# Patient Record
Sex: Male | Born: 1975
Health system: Southern US, Community
[De-identification: ages and names within clinical notes are randomized; demographics above are authoritative.]

## PROBLEM LIST (undated history)

## (undated) DIAGNOSIS — E039 Hypothyroidism, unspecified: Secondary | ICD-10-CM

## (undated) DIAGNOSIS — E7800 Pure hypercholesterolemia, unspecified: Secondary | ICD-10-CM

## (undated) DIAGNOSIS — I10 Essential (primary) hypertension: Secondary | ICD-10-CM

## (undated) DIAGNOSIS — E079 Disorder of thyroid, unspecified: Secondary | ICD-10-CM

## (undated) HISTORY — DX: Essential (primary) hypertension: I10

---

## 2007-08-05 ENCOUNTER — Encounter: Admission: RE | Admit: 2007-08-05 | Discharge: 2007-08-05 | Payer: Self-pay | Admitting: Family Medicine

## 2010-04-15 ENCOUNTER — Encounter: Payer: Self-pay | Admitting: Internal Medicine

## 2010-08-20 ENCOUNTER — Emergency Department (HOSPITAL_COMMUNITY)
Admission: EM | Admit: 2010-08-20 | Discharge: 2010-08-20 | Disposition: A | Discharge status: Home / Self Care | Payer: Medicaid Other | Attending: Emergency Medicine | Admitting: Emergency Medicine

## 2010-08-20 DIAGNOSIS — E7800 Pure hypercholesterolemia, unspecified: Secondary | ICD-10-CM | POA: Insufficient documentation

## 2010-08-20 DIAGNOSIS — R599 Enlarged lymph nodes, unspecified: Secondary | ICD-10-CM | POA: Insufficient documentation

## 2010-08-20 DIAGNOSIS — R197 Diarrhea, unspecified: Secondary | ICD-10-CM | POA: Insufficient documentation

## 2010-08-20 DIAGNOSIS — R1011 Right upper quadrant pain: Secondary | ICD-10-CM | POA: Insufficient documentation

## 2010-08-21 ENCOUNTER — Other Ambulatory Visit: Payer: Self-pay | Admitting: Family Medicine

## 2010-08-22 ENCOUNTER — Other Ambulatory Visit: Payer: Self-pay | Admitting: Family Medicine

## 2010-08-22 ENCOUNTER — Ambulatory Visit
Admission: RE | Admit: 2010-08-22 | Discharge: 2010-08-22 | Disposition: A | Discharge status: Home / Self Care | Payer: Medicaid Other | Source: Ambulatory Visit | Attending: Family Medicine | Admitting: Family Medicine

## 2010-08-22 DIAGNOSIS — R109 Unspecified abdominal pain: Secondary | ICD-10-CM

## 2011-03-26 ENCOUNTER — Emergency Department (HOSPITAL_COMMUNITY): Payer: Medicaid Other

## 2011-03-26 ENCOUNTER — Encounter: Payer: Self-pay | Admitting: Emergency Medicine

## 2011-03-26 ENCOUNTER — Emergency Department (HOSPITAL_COMMUNITY)
Admission: EM | Admit: 2011-03-26 | Discharge: 2011-03-27 | Disposition: A | Discharge status: Home / Self Care | Payer: Medicaid Other | Attending: Emergency Medicine | Admitting: Emergency Medicine

## 2011-03-26 DIAGNOSIS — B9789 Other viral agents as the cause of diseases classified elsewhere: Secondary | ICD-10-CM | POA: Insufficient documentation

## 2011-03-26 DIAGNOSIS — R059 Cough, unspecified: Secondary | ICD-10-CM | POA: Insufficient documentation

## 2011-03-26 DIAGNOSIS — R05 Cough: Secondary | ICD-10-CM | POA: Insufficient documentation

## 2011-03-26 DIAGNOSIS — IMO0001 Reserved for inherently not codable concepts without codable children: Secondary | ICD-10-CM | POA: Insufficient documentation

## 2011-03-26 DIAGNOSIS — R61 Generalized hyperhidrosis: Secondary | ICD-10-CM | POA: Insufficient documentation

## 2011-03-26 DIAGNOSIS — R079 Chest pain, unspecified: Secondary | ICD-10-CM | POA: Insufficient documentation

## 2011-03-26 DIAGNOSIS — R070 Pain in throat: Secondary | ICD-10-CM | POA: Insufficient documentation

## 2011-03-26 DIAGNOSIS — B349 Viral infection, unspecified: Secondary | ICD-10-CM

## 2011-03-26 DIAGNOSIS — Z79899 Other long term (current) drug therapy: Secondary | ICD-10-CM | POA: Insufficient documentation

## 2011-03-26 DIAGNOSIS — E7800 Pure hypercholesterolemia, unspecified: Secondary | ICD-10-CM | POA: Insufficient documentation

## 2011-03-26 DIAGNOSIS — R509 Fever, unspecified: Secondary | ICD-10-CM | POA: Insufficient documentation

## 2011-03-26 HISTORY — DX: Pure hypercholesterolemia, unspecified: E78.00

## 2011-03-26 NOTE — ED Notes (Signed)
PT. REPORTS PERSISTENT DRY COUGH , CHEST CONGESTION AND NAUSEA FOR 2 MONTHS , DENIES SOB , NO FEVER OR CHILLS.

## 2011-03-27 MED ORDER — IBUPROFEN 800 MG PO TABS
800.0000 mg | ORAL_TABLET | Freq: Once | ORAL | Status: AC
  Administered 2011-03-27: 800 mg via ORAL
  Filled 2011-03-27: qty 1

## 2011-03-27 MED ORDER — ACETAMINOPHEN-CODEINE 120-12 MG/5ML PO SUSP: 5.0000 mL | Freq: Four times a day (QID) | ORAL | Status: AC | PRN

## 2011-03-27 MED ORDER — ONDANSETRON 4 MG PO TBDP
8.0000 mg | ORAL_TABLET | Freq: Once | ORAL | Status: AC
  Administered 2011-03-27: 8 mg via ORAL
  Filled 2011-03-27: qty 2

## 2011-03-27 MED ORDER — IBUPROFEN 800 MG PO TABS: 800.0000 mg | ORAL_TABLET | Freq: Three times a day (TID) | ORAL | Status: AC

## 2011-03-27 MED ORDER — PROMETHAZINE HCL 25 MG PO TABS: 25.0000 mg | ORAL_TABLET | Freq: Four times a day (QID) | ORAL | Status: AC | PRN

## 2011-03-27 MED ORDER — ACETAMINOPHEN 325 MG PO TABS
650.0000 mg | ORAL_TABLET | Freq: Once | ORAL | Status: AC
  Administered 2011-03-27: 650 mg via ORAL
  Filled 2011-03-27: qty 2

## 2011-03-27 NOTE — ED Provider Notes (Signed)
History     CSN: [id number]  Arrival date & time 03/26/11  2202   First MD Initiated Contact with Patient 03/27/11 0117      Chief Complaint  Patient presents with  . Cough    (Consider location/radiation/quality/duration/timing/severity/associated sxs/prior treatment) Patient is a 36 y.o. male presenting with cough. The history is provided by the patient.  Cough This is a new problem. Episode onset: Are off last few weeks but worse over the last 2 days. The problem occurs every few minutes. The problem has been gradually worsening. The cough is non-productive. The maximum temperature recorded prior to his arrival was 102 to 102.9 F. The fever has been present for less than 1 day. Associated symptoms include chest pain, chills, sweats, sore throat and myalgias. Pertinent negatives include no headaches and no shortness of breath. He has tried nothing for the symptoms. The treatment provided no relief. Risk factors: No known sick contacts. Did not receive the flu shot this season. He is not a smoker. His past medical history does not include asthma.   Moderate in severity. Much worse tonight with fever bodyaches all over. No sick contacts at home or at work. He does take Questran medications but has no other medical problems. No known aggravating or alleviating factors. Past Medical History  Diagnosis Date  . Hypercholesterolemia     History reviewed. No pertinent past surgical history.  No family history on file.  History  Substance Use Topics  . Smoking status: Former Games developer  . Smokeless tobacco: Not on file  . Alcohol Use: Yes      Review of Systems  Constitutional: Positive for chills. Negative for fever.  HENT: Positive for sore throat. Negative for neck pain and neck stiffness.   Eyes: Negative for pain.  Respiratory: Positive for cough. Negative for shortness of breath.   Cardiovascular: Positive for chest pain.  Gastrointestinal: Negative for abdominal pain.    Genitourinary: Negative for dysuria.  Musculoskeletal: Positive for myalgias. Negative for back pain.  Skin: Negative for rash.  Neurological: Negative for headaches.  All other systems reviewed and are negative.    Allergies  Review of patient's allergies indicates no known allergies.  Home Medications   Current Outpatient Rx  Name Route Sig Dispense Refill  . PRAVASTATIN SODIUM 20 MG PO TABS Oral Take 20 mg by mouth daily.        BP 127/73  Pulse 96  Temp(Src) 99.9 F (37.7 C) (Oral)  Resp 22  SpO2 99%  Physical Exam  Constitutional: He is oriented to person, place, and time. He appears well-developed and well-nourished.  HENT:  Head: Normocephalic and atraumatic.  Nose: Nose normal.  Mouth/Throat: Oropharynx is clear and moist. No oropharyngeal exudate.  Eyes: Conjunctivae and EOM are normal. Pupils are equal, round, and reactive to light.  Neck: Trachea normal. Neck supple. No thyromegaly present.  Cardiovascular: Normal rate, regular rhythm, S1 normal, S2 normal and normal pulses.     No systolic murmur is present   No diastolic murmur is present  Pulses:      Radial pulses are 2+ on the right side, and 2+ on the left side.  Pulmonary/Chest: Effort normal and breath sounds normal. He has no wheezes. He has no rhonchi. He has no rales. He exhibits no tenderness.  Abdominal: Soft. Normal appearance and bowel sounds are normal. There is no tenderness. There is no CVA tenderness and negative Murphy's sign.  Musculoskeletal:       BLE:s Calves nontender,  no cords or erythema, negative Homans sign  Neurological: He is alert and oriented to person, place, and time. He has normal strength. No cranial nerve deficit or sensory deficit. GCS eye subscore is 4. GCS verbal subscore is 5. GCS motor subscore is 6.  Skin: Skin is warm and dry. No rash noted.  Psychiatric: His speech is normal.       Cooperative and appropriate    ED Course  Procedures (including critical care  time)  Labs Reviewed - No data to display Dg Chest 2 View  03/26/2011  *RADIOLOGY REPORT*  Clinical Data: Persistent dry cough.  Chest and abdominal pain for 2 months.  CHEST - 2 VIEW  Comparison: 08/05/2007 and 08/22/2010.  Findings: The heart size and mediastinal contours are normal. The lungs are clear. There is no pleural effusion or pneumothorax. No acute osseous findings are identified.  IMPRESSION: Stable examination.  No active cardiopulmonary process.  Original Report Authenticated By: Gerrianne Scale, M.D.    Tylenol and Motrin for fever. By mouth fluids and Zofran for some associated nausea.   MDM   Flulike symptoms. Chest x-ray obtained and reviewed as above no pneumonia. Symptoms improving with Tylenol, Motrin and Zofran. Work note provided. Viral precautions verbalized is understood. Patient has outpatient followup as needed otherwise states understanding all discharge and followup instructions. He is stable for discharge home without indication for further workup at this time       Sunnie Nielsen, MD 03/27/11 [phone]

## 2011-07-16 ENCOUNTER — Emergency Department (HOSPITAL_COMMUNITY)
Admission: EM | Admit: 2011-07-16 | Discharge: 2011-07-17 | Disposition: A | Discharge status: Home / Self Care | Payer: Medicaid Other | Attending: Emergency Medicine | Admitting: Emergency Medicine

## 2011-07-16 ENCOUNTER — Encounter (HOSPITAL_COMMUNITY): Payer: Self-pay | Admitting: Emergency Medicine

## 2011-07-16 ENCOUNTER — Emergency Department (HOSPITAL_COMMUNITY): Payer: Medicaid Other

## 2011-07-16 DIAGNOSIS — X58XXXA Exposure to other specified factors, initial encounter: Secondary | ICD-10-CM | POA: Insufficient documentation

## 2011-07-16 DIAGNOSIS — R1032 Left lower quadrant pain: Secondary | ICD-10-CM | POA: Insufficient documentation

## 2011-07-16 DIAGNOSIS — S39011A Strain of muscle, fascia and tendon of abdomen, initial encounter: Secondary | ICD-10-CM

## 2011-07-16 DIAGNOSIS — E7800 Pure hypercholesterolemia, unspecified: Secondary | ICD-10-CM | POA: Insufficient documentation

## 2011-07-16 DIAGNOSIS — IMO0002 Reserved for concepts with insufficient information to code with codable children: Secondary | ICD-10-CM | POA: Insufficient documentation

## 2011-07-16 DIAGNOSIS — R10819 Abdominal tenderness, unspecified site: Secondary | ICD-10-CM | POA: Insufficient documentation

## 2011-07-16 DIAGNOSIS — Z87891 Personal history of nicotine dependence: Secondary | ICD-10-CM | POA: Insufficient documentation

## 2011-07-16 LAB — URINALYSIS, ROUTINE W REFLEX MICROSCOPIC
Bilirubin Urine: NEGATIVE
Ketones, ur: NEGATIVE mg/dL
Nitrite: NEGATIVE
Urobilinogen, UA: 0.2 mg/dL (ref 0.0–1.0)

## 2011-07-16 LAB — POCT I-STAT, CHEM 8
Calcium, Ion: 1.3 mmol/L (ref 1.12–1.32)
Creatinine, Ser: 0.8 mg/dL (ref 0.50–1.35)
Hemoglobin: 16 g/dL (ref 13.0–17.0)
Sodium: 144 mEq/L (ref 135–145)
TCO2: 26 mmol/L (ref 0–100)

## 2011-07-16 LAB — CBC
Hemoglobin: 14.9 g/dL (ref 13.0–17.0)
MCH: 28.1 pg (ref 26.0–34.0)
MCHC: 34.8 g/dL (ref 30.0–36.0)
MCV: 80.6 fL (ref 78.0–100.0)
Platelets: 205 10*3/uL (ref 150–400)

## 2011-07-16 MED ORDER — IOHEXOL 300 MG/ML  SOLN
100.0000 mL | Freq: Once | INTRAMUSCULAR | Status: AC | PRN
  Administered 2011-07-16: 100 mL via INTRAVENOUS

## 2011-07-16 MED ORDER — SODIUM CHLORIDE 0.9 % IV SOLN
Freq: Once | INTRAVENOUS | Status: AC
  Administered 2011-07-16: 23:00:00 via INTRAVENOUS

## 2011-07-16 MED ORDER — HYDROMORPHONE HCL PF 1 MG/ML IJ SOLN
1.0000 mg | Freq: Once | INTRAMUSCULAR | Status: AC
  Administered 2011-07-16: 1 mg via INTRAVENOUS
  Filled 2011-07-16: qty 1

## 2011-07-16 MED ORDER — ONDANSETRON HCL 4 MG/2ML IJ SOLN
4.0000 mg | Freq: Once | INTRAMUSCULAR | Status: AC
  Administered 2011-07-16: 4 mg via INTRAVENOUS
  Filled 2011-07-16: qty 2

## 2011-07-16 MED ORDER — OXYCODONE-ACETAMINOPHEN 5-325 MG PO TABS
2.0000 | ORAL_TABLET | Freq: Once | ORAL | Status: AC
  Administered 2011-07-17: 2 via ORAL
  Filled 2011-07-16: qty 2

## 2011-07-16 MED ORDER — DIAZEPAM 5 MG/ML IJ SOLN
5.0000 mg | Freq: Once | INTRAMUSCULAR | Status: AC
  Administered 2011-07-17: 5 mg via INTRAVENOUS
  Filled 2011-07-16: qty 2

## 2011-07-16 NOTE — ED Provider Notes (Signed)
History     CSN: [id number]  Arrival date & time 07/16/11  1908   First MD Initiated Contact with Patient 07/16/11 2012      Chief Complaint  Patient presents with  . Abdominal Pain    (Consider location/radiation/quality/duration/timing/severity/associated sxs/prior treatment) HPI Comments: She had sudden onset left lower quadrant pain.  Today, worse when he moves he had normal bowel movements normal urination.  Denies testicular pain/ trauma .  Denies blood in his stool or his urine denies history of kidney stones  Patient is a 36 y.o. male presenting with abdominal pain. The history is provided by the patient.  Abdominal Pain The primary symptoms of the illness include abdominal pain. The primary symptoms of the illness do not include fatigue, shortness of breath, nausea, vomiting, diarrhea or dysuria. The current episode started 13 to 24 hours ago. The onset of the illness was sudden. The problem has not changed since onset. The patient has not had a change in bowel habit. Symptoms associated with the illness do not include constipation, urgency, hematuria, frequency or back pain.    Past Medical History  Diagnosis Date  . Hypercholesterolemia     History reviewed. No pertinent past surgical history.  No family history on file.  History  Substance Use Topics  . Smoking status: Former Games developer  . Smokeless tobacco: Not on file  . Alcohol Use: Yes      Review of Systems  Constitutional: Negative for fatigue.  Respiratory: Negative for shortness of breath.   Cardiovascular: Negative for leg swelling.  Gastrointestinal: Positive for abdominal pain. Negative for nausea, vomiting, diarrhea, constipation, blood in stool, anal bleeding and rectal pain.  Genitourinary: Negative for dysuria, urgency, frequency, hematuria, flank pain, penile pain and testicular pain.  Musculoskeletal: Negative for back pain.  Neurological: Negative for weakness.    Allergies  Review of  patient's allergies indicates no known allergies.  Home Medications   Current Outpatient Rx  Name Route Sig Dispense Refill  . PRAVASTATIN SODIUM 20 MG PO TABS Oral Take 20 mg by mouth daily.      Marland Kitchen DIAZEPAM 5 MG PO TABS Oral Take 1 tablet (5 mg total) by mouth 2 (two) times daily. 10 tablet 0  . HYDROCODONE-ACETAMINOPHEN 5-500 MG PO TABS Oral Take 1 tablet by mouth every 6 (six) hours as needed for pain. 30 tablet 0    BP 129/83  Pulse 79  Temp(Src) 98.7 F (37.1 C) (Oral)  Resp 20  SpO2 100%  Physical Exam  Constitutional: He is oriented to person, place, and time. He appears well-developed and well-nourished.  Neck: Normal range of motion.  Cardiovascular: Normal rate.   Pulmonary/Chest: Effort normal.  Abdominal: Soft. Bowel sounds are normal. He exhibits no distension. There is tenderness. There is no rebound. No hernia. Hernia confirmed negative in the right inguinal area and confirmed negative in the left inguinal area.    Genitourinary: Rectum normal and penis normal. Right testis shows no swelling and no tenderness. Cremasteric reflex is not absent on the right side. Left testis shows no swelling and no tenderness. Cremasteric reflex is not absent on the left side. Circumcised. No penile tenderness.  Neurological: He is alert and oriented to person, place, and time.  Skin: Skin is warm. No rash noted. No erythema.    ED Course  Procedures (including critical care time)   Labs Reviewed  URINALYSIS, ROUTINE W REFLEX MICROSCOPIC  CBC  POCT I-STAT, CHEM 8   Ct Abdomen Pelvis W Contrast  07/16/2011  *RADIOLOGY REPORT*  Clinical Data: Left lower quadrant pain and nausea since this morning.  CT ABDOMEN AND PELVIS WITH CONTRAST  Technique:  Multidetector CT imaging of the abdomen and pelvis was performed following the standard protocol during bolus administration of intravenous contrast.  Contrast:  100 ml Omnipaque-300  Comparison: None.  Findings: Slight fibrosis in the  left lung base.  The liver, spleen, gallbladder, pancreas, adrenal glands, kidneys, abdominal aorta, retroperitoneal lymph nodes, stomach, and small bowel are unremarkable.  Stool filled colon without distension or wall thickening.  No free air or free fluid in the abdomen.  Pelvis:  Prostate gland is not enlarged.  No bladder wall thickening.  No free or loculated pelvic fluid collections.  No evidence of diverticulitis.  No significant pelvic lymphadenopathy. The appendix is normal.  Normal alignment of the lumbar vertebrae with mild lower thoracic degenerative change.  IMPRESSION: No acute process demonstrated in the abdomen or pelvis.  Original Report Authenticated By: Marlon Pel, M.D.     1. Abdominal muscle strain       MDM  Left lower quadrant/groin pain, without erythema, enlarged lymph nodes.  No testicular involvement.  No sign of inguinal hernia, abscess.  Will obtain CBC i-STAT, and abdominal CT, as well as a urine If you have labs and CT scan reveals normal results.  We'll treat as a muscular injury and have patient followup with primary care provider The patient was given IV Valium and by mouth Percocet.  He is up walking around the room stating he feels much better.  We'll discharge home with by mouth Valium, Vicodin as needed        Arman Filter, NP 07/17/11 [phone]

## 2011-07-16 NOTE — ED Notes (Signed)
Patient with abdominal pain that started this morning.  Patient denies any n/v/d.  BM x2, normal.  Sent from MD office,.

## 2011-07-17 MED ORDER — DIAZEPAM 5 MG PO TABS: 5.0000 mg | ORAL_TABLET | Freq: Two times a day (BID) | ORAL | Status: AC

## 2011-07-17 MED ORDER — HYDROCODONE-ACETAMINOPHEN 5-500 MG PO TABS: 1.0000 | ORAL_TABLET | Freq: Four times a day (QID) | ORAL | Status: AC | PRN

## 2011-07-17 NOTE — Discharge Instructions (Signed)
Please take the medication as directed for your abdominal wall strain.  He can use over-the-counter ibuprofen or Advil as well.  Do not take additional Tylenol as the Vicodin contains Tylenol.  Please try to rest perform your daily activities, but no additional sports heavy lifting yard work

## 2011-07-20 NOTE — ED Provider Notes (Signed)
Medical screening examination/treatment/procedure(s) were performed by non-physician practitioner and as supervising physician I was immediately available for consultation/collaboration.  Geoffery Lyons, MD 07/20/11 [phone]

## 2014-05-31 ENCOUNTER — Emergency Department (HOSPITAL_COMMUNITY)
Admission: EM | Admit: 2014-05-31 | Discharge: 2014-05-31 | Disposition: A | Discharge status: Home / Self Care | Payer: Medicaid Other | Attending: Emergency Medicine | Admitting: Emergency Medicine

## 2014-05-31 ENCOUNTER — Encounter (HOSPITAL_COMMUNITY): Payer: Self-pay | Admitting: Respiratory Therapy

## 2014-05-31 ENCOUNTER — Emergency Department (HOSPITAL_COMMUNITY): Payer: Medicaid Other

## 2014-05-31 DIAGNOSIS — Z8639 Personal history of other endocrine, nutritional and metabolic disease: Secondary | ICD-10-CM | POA: Insufficient documentation

## 2014-05-31 DIAGNOSIS — Z87891 Personal history of nicotine dependence: Secondary | ICD-10-CM | POA: Diagnosis not present

## 2014-05-31 DIAGNOSIS — R1110 Vomiting, unspecified: Secondary | ICD-10-CM | POA: Diagnosis present

## 2014-05-31 DIAGNOSIS — K529 Noninfective gastroenteritis and colitis, unspecified: Secondary | ICD-10-CM | POA: Insufficient documentation

## 2014-05-31 DIAGNOSIS — B349 Viral infection, unspecified: Secondary | ICD-10-CM | POA: Insufficient documentation

## 2014-05-31 LAB — CBC WITH DIFFERENTIAL/PLATELET
BASOS ABS: 0 10*3/uL (ref 0.0–0.1)
BASOS PCT: 0 % (ref 0–1)
EOS ABS: 0 10*3/uL (ref 0.0–0.7)
Eosinophils Relative: 0 % (ref 0–5)
HEMATOCRIT: 43.4 % (ref 39.0–52.0)
HEMOGLOBIN: 15 g/dL (ref 13.0–17.0)
LYMPHS PCT: 17 % (ref 12–46)
Lymphs Abs: 0.9 10*3/uL (ref 0.7–4.0)
MCH: 27 pg (ref 26.0–34.0)
MCHC: 34.6 g/dL (ref 30.0–36.0)
MCV: 78.2 fL (ref 78.0–100.0)
MONOS PCT: 16 % — AB (ref 3–12)
Monocytes Absolute: 0.8 10*3/uL (ref 0.1–1.0)
NEUTROS PCT: 67 % (ref 43–77)
Neutro Abs: 3.5 10*3/uL (ref 1.7–7.7)
PLATELETS: 207 10*3/uL (ref 150–400)
RBC: 5.55 MIL/uL (ref 4.22–5.81)
RDW: 12.9 % (ref 11.5–15.5)
WBC: 5.2 10*3/uL (ref 4.0–10.5)

## 2014-05-31 LAB — URINALYSIS, ROUTINE W REFLEX MICROSCOPIC
Glucose, UA: NEGATIVE mg/dL
HGB URINE DIPSTICK: NEGATIVE
Ketones, ur: 40 mg/dL — AB
LEUKOCYTES UA: NEGATIVE
Nitrite: NEGATIVE
PH: 6 (ref 5.0–8.0)
PROTEIN: 30 mg/dL — AB
SPECIFIC GRAVITY, URINE: 1.028 (ref 1.005–1.030)
Urobilinogen, UA: 0.2 mg/dL (ref 0.0–1.0)

## 2014-05-31 LAB — COMPREHENSIVE METABOLIC PANEL
ALBUMIN: 3.7 g/dL (ref 3.5–5.2)
ALK PHOS: 97 U/L (ref 39–117)
ALT: 51 U/L (ref 0–53)
ANION GAP: 8 (ref 5–15)
AST: 75 U/L — ABNORMAL HIGH (ref 0–37)
BUN: 8 mg/dL (ref 6–23)
CO2: 21 mmol/L (ref 19–32)
Calcium: 8.9 mg/dL (ref 8.4–10.5)
Chloride: 104 mmol/L (ref 96–112)
Creatinine, Ser: 1.01 mg/dL (ref 0.50–1.35)
GFR calc Af Amer: >90 mL/min (ref >90)
GLUCOSE: 106 mg/dL — AB (ref 70–99)
POTASSIUM: 3.5 mmol/L (ref 3.5–5.1)
Sodium: 133 mmol/L — ABNORMAL LOW (ref 135–145)
Total Bilirubin: 0.6 mg/dL (ref 0.3–1.2)
Total Protein: 7.2 g/dL (ref 6.0–8.3)

## 2014-05-31 LAB — URINE MICROSCOPIC-ADD ON

## 2014-05-31 MED ORDER — SODIUM CHLORIDE 0.9 % IV BOLUS (SEPSIS)
1000.0000 mL | Freq: Once | INTRAVENOUS | Status: AC
  Administered 2014-05-31: 1000 mL via INTRAVENOUS

## 2014-05-31 MED ORDER — ONDANSETRON HCL 4 MG/2ML IJ SOLN
4.0000 mg | Freq: Once | INTRAMUSCULAR | Status: AC
  Administered 2014-05-31: 4 mg via INTRAVENOUS
  Filled 2014-05-31: qty 2

## 2014-05-31 MED ORDER — ONDANSETRON 4 MG PO TBDP: ORAL_TABLET | ORAL | Status: DC

## 2014-05-31 NOTE — Discharge Instructions (Signed)

## 2014-05-31 NOTE — ED Notes (Signed)
Pt verbalized understanding of d/c instructions and has no further questions.  

## 2014-05-31 NOTE — ED Notes (Signed)
TRIAGE NOTES BY A ROBY RN

## 2014-05-31 NOTE — ED Notes (Signed)
Pt reports n/v/d/abd pain/body aches since Tuesday. His child was sick with GI bug prior to onset of his symptoms

## 2014-05-31 NOTE — ED Provider Notes (Signed)
CSN: [id number]     Arrival date & time 05/31/14  1859 History   First MD Initiated Contact with Patient 05/31/14 2102     Chief Complaint  Patient presents with  . Emesis     (Consider location/radiation/quality/duration/timing/severity/associated sxs/prior Treatment) Patient is a 39 y.o. male presenting with vomiting.  Emesis Severity:  Moderate Duration:  2 days Timing:  Constant Quality:  Stomach contents Progression:  Unchanged Chronicity:  New Relieved by:  Nothing Worsened by:  Nothing tried Ineffective treatments:  None tried Associated symptoms: abdominal pain (LUQ), chills, cough, diarrhea and URI   Associated symptoms: no fever     Past Medical History  Diagnosis Date  . Hypercholesterolemia    History reviewed. No pertinent past surgical history. History reviewed. No pertinent family history. History  Substance Use Topics  . Smoking status: Former Games developer  . Smokeless tobacco: Not on file  . Alcohol Use: Yes    Review of Systems  Constitutional: Positive for chills.  Gastrointestinal: Positive for vomiting, abdominal pain (LUQ) and diarrhea.  All other systems reviewed and are negative.     Allergies  Review of patient's allergies indicates no known allergies.  Home Medications   Prior to Admission medications   Medication Sig Start Date End Date Taking? Authorizing Provider  ondansetron (ZOFRAN ODT) 4 MG disintegrating tablet  ODT q4 hours prn nausea/vomit 05/31/14   Mirian Mo, MD   BP 128/87 mmHg  Pulse 101  Temp(Src) 101.9 F (38.8 C) (Oral)  Resp 20  Ht  (1.753 m)  Wt 230 lb (104.327 kg)  BMI 33.95 kg/m2  SpO2 100% Physical Exam  Constitutional: He is oriented to person, place, and time. He appears well-developed and well-nourished.  HENT:  Head: Normocephalic and atraumatic.  Eyes: Conjunctivae and EOM are normal.  Neck: Normal range of motion. Neck supple.  Cardiovascular: Normal rate, regular rhythm and normal heart  sounds.   Pulmonary/Chest: Effort normal and breath sounds normal. No respiratory distress.  Abdominal: He exhibits no distension. There is tenderness in the left upper quadrant. There is no rebound and no guarding.  Musculoskeletal: Normal range of motion.  Neurological: He is alert and oriented to person, place, and time.  Skin: Skin is warm and dry.  Vitals reviewed.   ED Course  Procedures (including critical care time) Labs Review Labs Reviewed  COMPREHENSIVE METABOLIC PANEL - Abnormal; Notable for the following:    Sodium 133 (*)    Glucose, Bld 106 (*)    AST 75 (*)    All other components within normal limits  CBC WITH DIFFERENTIAL/PLATELET - Abnormal; Notable for the following:    Monocytes Relative 16 (*)    All other components within normal limits  URINALYSIS, ROUTINE W REFLEX MICROSCOPIC - Abnormal; Notable for the following:    Color, Urine AMBER (*)    Bilirubin Urine SMALL (*)    Ketones, ur 40 (*)    Protein, ur 30 (*)    All other components within normal limits  URINE MICROSCOPIC-ADD ON    Imaging Review Dg Chest 2 View  05/31/2014   CLINICAL DATA:  Productive cough.  Nausea, vomiting and diarrhea.  EXAM: CHEST  2 VIEW  COMPARISON:  03/26/2011; 08/22/2010  FINDINGS: Grossly unchanged cardiac silhouette and mediastinal contours. There is mild diffuse slightly nodular thickening of the pulmonary interstitium. There is minimal pleural parenchymal thickening about the right minor fissure. No discrete focal airspace opacities. No pleural effusion or pneumothorax. No evidence of edema. No  acute osseus abnormalities.  IMPRESSION: Findings suggestive of airways disease / bronchitis. No focal airspace opacities to suggest pneumonia.   Electronically Signed   By: Simonne ComeJohn  Watts M.D.   On: 05/31/2014 22:01     EKG Interpretation None      MDM   Final diagnoses:  Viral syndrome  Gastroenteritis    39 y.o. male without pertinent PMH presents with signs/symptoms  consistent with viral process. Symptoms present 2 days, positive sick contact of child and wife.  On arrival vital signs and physical exam as above.  Wu unremarkable.  Given that the patient has had sick contacts with identical symptoms, unremarkable workup today, improvement with normal saline, consider likely etiology viral gastroenteritis. Discharged home with standard return precautions, prescription for Zofran. Discharged home in stable condition.  I have reviewed all laboratory and imaging studies if ordered as above  1. Viral syndrome   2. Gastroenteritis         Mirian MoMatthew Lineth Thielke, MD 05/31/14 2253

## 2014-08-11 ENCOUNTER — Other Ambulatory Visit: Payer: Self-pay | Admitting: Family Medicine

## 2014-08-11 ENCOUNTER — Ambulatory Visit
Admission: RE | Admit: 2014-08-11 | Discharge: 2014-08-11 | Disposition: A | Discharge status: Home / Self Care | Payer: Medicaid Other | Source: Ambulatory Visit | Attending: Family Medicine | Admitting: Family Medicine

## 2014-08-11 DIAGNOSIS — Z720 Tobacco use: Secondary | ICD-10-CM

## 2014-10-02 ENCOUNTER — Other Ambulatory Visit: Payer: Self-pay | Admitting: Internal Medicine

## 2014-10-02 DIAGNOSIS — E0500 Thyrotoxicosis with diffuse goiter without thyrotoxic crisis or storm: Secondary | ICD-10-CM

## 2014-10-16 ENCOUNTER — Ambulatory Visit (HOSPITAL_COMMUNITY)
Admission: RE | Admit: 2014-10-16 | Discharge: 2014-10-16 | Disposition: A | Discharge status: Home / Self Care | Payer: Medicaid Other | Source: Ambulatory Visit | Attending: Internal Medicine | Admitting: Internal Medicine

## 2014-10-16 DIAGNOSIS — E0500 Thyrotoxicosis with diffuse goiter without thyrotoxic crisis or storm: Secondary | ICD-10-CM

## 2014-10-17 ENCOUNTER — Ambulatory Visit (HOSPITAL_COMMUNITY)
Admission: RE | Admit: 2014-10-17 | Discharge: 2014-10-17 | Disposition: A | Discharge status: Home / Self Care | Payer: Medicaid Other | Source: Ambulatory Visit | Attending: Internal Medicine | Admitting: Internal Medicine

## 2014-10-17 DIAGNOSIS — E0590 Thyrotoxicosis, unspecified without thyrotoxic crisis or storm: Secondary | ICD-10-CM | POA: Diagnosis present

## 2014-10-17 MED ORDER — SODIUM IODIDE I 131 CAPSULE
7.3000 | Freq: Once | INTRAVENOUS | Status: AC | PRN
  Administered 2014-10-17: 7.3 via ORAL

## 2014-10-17 MED ORDER — SODIUM PERTECHNETATE TC 99M INJECTION
10.7000 | Freq: Once | INTRAVENOUS | Status: AC | PRN
  Administered 2014-10-17: 11 via INTRAVENOUS

## 2014-10-20 ENCOUNTER — Ambulatory Visit (HOSPITAL_COMMUNITY): Admission: RE | Admit: 2014-10-20 | Payer: Medicaid Other | Source: Ambulatory Visit

## 2014-10-23 ENCOUNTER — Encounter (HOSPITAL_COMMUNITY)
Admission: RE | Admit: 2014-10-23 | Discharge: 2014-10-23 | Disposition: A | Discharge status: Home / Self Care | Payer: Medicaid Other | Source: Ambulatory Visit | Attending: Internal Medicine | Admitting: Internal Medicine

## 2014-10-23 DIAGNOSIS — E0500 Thyrotoxicosis with diffuse goiter without thyrotoxic crisis or storm: Secondary | ICD-10-CM | POA: Diagnosis not present

## 2014-10-23 MED ORDER — SODIUM IODIDE I 131 CAPSULE
13.0400 | Freq: Once | INTRAVENOUS | Status: AC | PRN
  Administered 2014-10-23: 13.04 via ORAL

## 2015-01-11 ENCOUNTER — Emergency Department (HOSPITAL_COMMUNITY): Payer: Medicaid Other

## 2015-01-11 ENCOUNTER — Emergency Department (HOSPITAL_COMMUNITY)
Admission: EM | Admit: 2015-01-11 | Discharge: 2015-01-11 | Disposition: A | Discharge status: Home / Self Care | Payer: Medicaid Other | Attending: Emergency Medicine | Admitting: Emergency Medicine

## 2015-01-11 ENCOUNTER — Encounter (HOSPITAL_COMMUNITY): Payer: Self-pay

## 2015-01-11 DIAGNOSIS — G5601 Carpal tunnel syndrome, right upper limb: Secondary | ICD-10-CM | POA: Diagnosis not present

## 2015-01-11 DIAGNOSIS — Z87891 Personal history of nicotine dependence: Secondary | ICD-10-CM | POA: Diagnosis not present

## 2015-01-11 DIAGNOSIS — R0982 Postnasal drip: Secondary | ICD-10-CM | POA: Insufficient documentation

## 2015-01-11 DIAGNOSIS — Z791 Long term (current) use of non-steroidal anti-inflammatories (NSAID): Secondary | ICD-10-CM | POA: Insufficient documentation

## 2015-01-11 DIAGNOSIS — Z8639 Personal history of other endocrine, nutritional and metabolic disease: Secondary | ICD-10-CM | POA: Diagnosis not present

## 2015-01-11 DIAGNOSIS — M79641 Pain in right hand: Secondary | ICD-10-CM | POA: Diagnosis present

## 2015-01-11 DIAGNOSIS — Z79899 Other long term (current) drug therapy: Secondary | ICD-10-CM | POA: Diagnosis not present

## 2015-01-11 DIAGNOSIS — M545 Low back pain: Secondary | ICD-10-CM | POA: Insufficient documentation

## 2015-01-11 DIAGNOSIS — R0981 Nasal congestion: Secondary | ICD-10-CM | POA: Diagnosis not present

## 2015-01-11 HISTORY — DX: Disorder of thyroid, unspecified: E07.9

## 2015-01-11 LAB — BASIC METABOLIC PANEL
ANION GAP: 6 (ref 5–15)
BUN: 17 mg/dL (ref 6–20)
CALCIUM: 8.9 mg/dL (ref 8.9–10.3)
CO2: 26 mmol/L (ref 22–32)
Chloride: 106 mmol/L (ref 101–111)
Creatinine, Ser: 1.25 mg/dL — ABNORMAL HIGH (ref 0.61–1.24)
GFR calc Af Amer: >60 mL/min (ref >60)
Glucose, Bld: 80 mg/dL (ref 65–99)
Potassium: 4.3 mmol/L (ref 3.5–5.1)
SODIUM: 138 mmol/L (ref 135–145)

## 2015-01-11 LAB — CBC
HCT: 46.5 % (ref 39.0–52.0)
HEMOGLOBIN: 15.9 g/dL (ref 13.0–17.0)
MCH: 27.2 pg (ref 26.0–34.0)
MCHC: 34.2 g/dL (ref 30.0–36.0)
MCV: 79.5 fL (ref 78.0–100.0)
Platelets: 243 10*3/uL (ref 150–400)
RBC: 5.85 MIL/uL — ABNORMAL HIGH (ref 4.22–5.81)
RDW: 15.4 % (ref 11.5–15.5)
WBC: 6.9 10*3/uL (ref 4.0–10.5)

## 2015-01-11 LAB — URINALYSIS, ROUTINE W REFLEX MICROSCOPIC
BILIRUBIN URINE: NEGATIVE
Glucose, UA: NEGATIVE mg/dL
Hgb urine dipstick: NEGATIVE
Ketones, ur: NEGATIVE mg/dL
Leukocytes, UA: NEGATIVE
Nitrite: NEGATIVE
PROTEIN: NEGATIVE mg/dL
Specific Gravity, Urine: 1.034 — ABNORMAL HIGH (ref 1.005–1.030)
UROBILINOGEN UA: 1 mg/dL (ref 0.0–1.0)
pH: 6 (ref 5.0–8.0)

## 2015-01-11 MED ORDER — IBUPROFEN 600 MG PO TABS: 600.0000 mg | ORAL_TABLET | Freq: Four times a day (QID) | ORAL | Status: DC | PRN

## 2015-01-11 MED ORDER — ACETAMINOPHEN 325 MG PO TABS
650.0000 mg | ORAL_TABLET | Freq: Once | ORAL | Status: AC
  Administered 2015-01-11: 650 mg via ORAL
  Filled 2015-01-11: qty 2

## 2015-01-11 MED ORDER — KETOROLAC TROMETHAMINE 30 MG/ML IJ SOLN
30.0000 mg | Freq: Once | INTRAMUSCULAR | Status: AC
  Administered 2015-01-11: 30 mg via INTRAMUSCULAR
  Filled 2015-01-11: qty 1

## 2015-01-11 MED ORDER — DEXAMETHASONE 4 MG PO TABS
10.0000 mg | ORAL_TABLET | Freq: Once | ORAL | Status: AC
  Administered 2015-01-11: 10 mg via ORAL
  Filled 2015-01-11: qty 1
  Filled 2015-01-11: qty 2

## 2015-01-11 NOTE — ED Notes (Signed)
Pt c/o intermittent R hand swelling/pain radiating into fingers and causing intermittent finger numbness, low back pain, intermittent R foot pain/cramping, and dark urine x 2 weeks and nasal congestion/intermittent L side blockage x 1 year.  Pain score 5/10.  Pt reports taking Aleve w/o relief.  Pt reports that he would like to be checked for MRSA, because his wife was diagnosed w/ it earlier this year and again, 1 month ago.  Also, he took radioactive pills to decrease thyroid and would like it checked.  Sts he has follow up w/ his MD on 10/31.

## 2015-01-11 NOTE — ED Provider Notes (Signed)
CSN: [id number]     Arrival date & time 01/11/15  1737 History   First MD Initiated Contact with Patient 01/11/15 2127     Chief Complaint  Patient presents with  . Hand Pain  . Back Pain  . Foot Pain     (Consider location/radiation/quality/duration/timing/severity/associated sxs/prior Treatment) HPI Comments: 39 y.o. Male with history of thyroid disease, hypercholesterolemia presents for intermittent right hand swelling and numbness.  He has multiple complaints but the hand is his main complaint.  He also complains of constant congestion of his nose with foul breath for 1 year and has noted and notes his urine has been slightly dark in color.  The patient believes that lower back pain that he has that some times is associated with pain in his calf and his hand pain are related.  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  He notes that the hand pain and numbness can sometimes be worse after work as can pain/aching in his lower back that sometimes radiates down is right leg.  He denies fever, chills, weight loss, injury, or trauma.  Regarding his sinus issue he denies chest pain, shortness of breath, ear pain/fullness, fever, chills.  He has not tried anything at home for his intermittent sinus fullness and congestion that has been going on for 1 year.  Patient is a 39 y.o. male presenting with hand pain, back pain, and lower extremity pain.  Hand Pain Pertinent negatives include no abdominal pain, no headaches and no shortness of breath.  Back Pain Associated symptoms: numbness (intermittent in the right hand) and weakness   Associated symptoms: no abdominal pain, no dysuria, no fever and no headaches   Foot Pain Pertinent negatives include no abdominal pain, no headaches and no shortness of breath.    Past Medical History  Diagnosis Date  . Hypercholesterolemia   . Thyroid disease    History reviewed. No pertinent past  surgical history. History reviewed. No pertinent family history. Social History  Substance Use Topics  . Smoking status: Former Games developer  . Smokeless tobacco: None  . Alcohol Use: No    Review of Systems  Constitutional: Negative for fever, chills, diaphoresis and fatigue.  HENT: Positive for congestion. Negative for ear pain, postnasal drip, rhinorrhea, sore throat, trouble swallowing and voice change.   Eyes: Negative for pain and redness.  Respiratory: Negative for cough, chest tightness and shortness of breath.   Gastrointestinal: Negative for nausea, vomiting, abdominal pain, diarrhea and constipation.  Genitourinary: Negative for dysuria, urgency, frequency, hematuria, decreased urine volume, discharge, scrotal swelling and penile pain.  Musculoskeletal: Positive for back pain.  Neurological: Positive for weakness and numbness (intermittent in the right hand). Negative for dizziness, tremors, seizures, syncope, speech difficulty, light-headedness and headaches.      Allergies  Review of patient's allergies indicates no known allergies.  Home Medications   Prior to Admission medications   Medication Sig Start Date End Date Taking? Authorizing Provider  Multiple Vitamins-Minerals (MULTIVITAMIN & MINERAL PO) Take 1 tablet by mouth daily.   Yes Historical Provider, MD  naproxen sodium (ANAPROX) 220 MG tablet Take 220 mg by mouth 2 (two) times daily with a meal.   Yes Historical Provider, MD  ondansetron (ZOFRAN ODT) 4 MG disintegrating tablet  ODT q4 hours prn nausea/vomit Patient not taking: Reported on 01/11/2015 05/31/14   Mirian Mo, MD   BP 152/95 mmHg  Pulse 56  Temp(Src) 98.1 F (  36.7 C) (Oral)  Resp 16  SpO2 98% Physical Exam  Constitutional: He is oriented to person, place, and time. He appears well-developed and well-nourished. No distress.  HENT:  Head: Normocephalic and atraumatic.  Right Ear: External ear normal.  Left Ear: External ear normal.  Nose:  Nose normal. No mucosal edema or sinus tenderness. Right sinus exhibits no maxillary sinus tenderness and no frontal sinus tenderness. Left sinus exhibits no maxillary sinus tenderness and no frontal sinus tenderness.  Mouth/Throat: Oropharynx is clear and moist. No oropharyngeal exudate.  Post nasal drip is noted over the posterior pharynx  Eyes: EOM are normal. Pupils are equal, round, and reactive to light.  Neck: Normal range of motion and full passive range of motion without pain. Neck supple. No spinous process tenderness and no muscular tenderness present. Normal range of motion present.  Cardiovascular: Normal rate, regular rhythm, normal heart sounds and intact distal pulses.   No murmur heard. Pulmonary/Chest: Effort normal. No respiratory distress. He has no wheezes. He has no rales.  Abdominal: Soft. He exhibits no distension. There is no tenderness.  Musculoskeletal: He exhibits no edema.       Right hip: Normal.       Left hip: Normal.       Thoracic back: Normal.       Lumbar back: He exhibits normal range of motion, no tenderness, no bony tenderness, no swelling, no edema, no pain and no spasm.       Right hand: He exhibits normal range of motion, no tenderness, no bony tenderness, normal two-point discrimination, normal capillary refill, no deformity, no laceration and no swelling (not able to appreciate any on examination). Normal sensation noted. Normal strength noted.  Patient with positive Phalen Test on examination with ability to illicit pain and tingling of the right hand on examination  Neurological: He is alert and oriented to person, place, and time. He has normal strength. No sensory deficit. Gait normal.  Able to stable and walk without difficulty or issue.  No weakness.  Skin: Skin is warm and dry. No rash noted. He is not diaphoretic.  Vitals reviewed.   ED Course  Procedures (including critical care time) Labs Review Labs Reviewed  BASIC METABOLIC PANEL -  Abnormal; Notable for the following:    Creatinine, Ser 1.25 (*)    All other components within normal limits  CBC - Abnormal; Notable for the following:    RBC 5.85 (*)    All other components within normal limits  URINALYSIS, ROUTINE W REFLEX MICROSCOPIC (NOT AT [facility]) - Abnormal; Notable for the following:    Specific Gravity, Urine 1.034 (*)    All other components within normal limits    Imaging Review Dg Lumbar Spine Complete  01/11/2015  CLINICAL DATA:  Acute onset of lower central back pain. Initial encounter. EXAM: LUMBAR SPINE - COMPLETE 4+ VIEW COMPARISON:  CT of the abdomen and pelvis performed 07/16/2011 FINDINGS: There is no evidence of fracture or subluxation. Vertebral bodies demonstrate normal height and alignment. Intervertebral disc spaces are preserved. The visualized neural foramina are grossly unremarkable in appearance. The visualized bowel gas pattern is unremarkable in appearance; air and stool are noted within the colon. The sacroiliac joints are within normal limits. IMPRESSION: No evidence of fracture or subluxation along the lumbar spine. Electronically Signed   By: Roanna Raider M.D.   On: 01/11/2015 23:04   Dg Hand 2 View Right  01/11/2015  CLINICAL DATA:  Acute onset of right hand  swelling and pain, with intermittent finger numbness. Initial encounter. EXAM: RIGHT HAND - 2 VIEW COMPARISON:  None. FINDINGS: There is no evidence of fracture or dislocation. The joint spaces are preserved. The carpal rows are intact, and demonstrate normal alignment. The soft tissues are unremarkable in appearance. IMPRESSION: No evidence of fracture or dislocation. Electronically Signed   By: Roanna RaiderJeffery  Chang M.D.   On: 01/11/2015 23:04   I have personally reviewed and evaluated these images and lab results as part of my medical decision-making.   EKG Interpretation None      MDM  Patient was seen and evaluated in stable condition.  Presenting with multiple complaints.  Appears  to have issue with a chronic sinusitis without active infection.  Patient instructed to use of the counter saline nose spray to help with the problem and told to follow up with his PCP regarding this issue to see if that helps.  No back pain on exam with normal back examination and xray.  History concerning for possible sciatica related to patient working on feet for multiple hours in a row.  No hand swelling appreciated but pain and tingling reproducible on Phalen test and history of repetitive burger flipping consistent with carpal tunnel.  Patient was given Toradol and Decadron and wrist splint was applied.  Labs revealed Cr of 1.3 and elevated specific gravity in the urine which were discussed with patient as was the need for good hydration and recheck of his kidney function.  Patient drinking fluids and instructed to not use ibuprofen/motrin/advil until told he can by his primary care physician as he was using it to try to control his symptoms at home.  Patient expressed understanding of all results, clinical impression, and plan of care.  He was discharged home in stable condition with instruction to follow up with his PCP.  All questions answered prior to discharge. Final diagnoses:  None    1. Right arm carpal tunnel syndrome  2. Cr elevation, mild  3. Chronic sinusitis  4. Back pain with radicular symptoms    Leta BaptistEmily Roe Nguyen, MD 01/12/15 [phone]

## 2015-01-11 NOTE — ED Notes (Signed)
Pt here from home. Pt has multiple complaints. Pt states his right hand hurts and often becomes numb, his right foot sometimes hurts, and his lower central back hurts.  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 Pt also wants his thyroid levels checked and wants to be checked for MRSA as per previous note

## 2015-01-11 NOTE — Discharge Instructions (Signed)
You were seen today and diagnosed with Carpal Tunnel Syndrome of your right hand, possible chronic sinusitis without active infection, and your kidney function was mildly elevated with signs of dehydration.  Follow up with your primary care physician as directed below for reevaluation of these issues.    Carpal Tunnel Syndrome Carpal tunnel syndrome is a condition that causes pain in your hand and arm. The carpal tunnel is a narrow area located on the palm side of your wrist. Repeated wrist motion or certain diseases may cause swelling within the tunnel. This swelling pinches the main nerve in the wrist (median nerve). CAUSES  This condition may be caused by:   Repeated wrist motions.  Wrist injuries.  Arthritis.  A cyst or tumor in the carpal tunnel.  Fluid buildup during pregnancy. Sometimes the cause of this condition is not known.  RISK FACTORS This condition is more likely to develop in:   People who have jobs that cause them to repeatedly move their wrists in the same motion, such as butchers and cashiers.  Women.  People with certain conditions, such as:  Diabetes.  Obesity.  An underactive thyroid (hypothyroidism).  Kidney failure. SYMPTOMS  Symptoms of this condition include:   A tingling feeling in your fingers, especially in your thumb, index, and middle fingers.  Tingling or numbness in your hand.  An aching feeling in your entire arm, especially when your wrist and elbow are bent for long periods of time.  Wrist pain that goes up your arm to your shoulder.  Pain that goes down into your palm or fingers.  A weak feeling in your hands. You may have trouble grabbing and holding items. Your symptoms may feel worse during the night.  DIAGNOSIS  This condition is diagnosed with a medical history and physical exam. You may also have tests, including:   An electromyogram (EMG). This test measures electrical signals sent by your nerves into the  muscles.  X-rays. TREATMENT  Treatment for this condition includes:  Lifestyle changes. It is important to stop doing or modify the activity that caused your condition.  Physical or occupational therapy.  Medicines for pain and inflammation. This may include medicine that is injected into your wrist.  A wrist splint.  Surgery. HOME CARE INSTRUCTIONS  If You Have a Splint:  Wear it as told by your health care provider. Remove it only as told by your health care provider.  Loosen the splint if your fingers become numb and tingle, or if they turn cold and blue.  Keep the splint clean and dry. General Instructions  Take over-the-counter and prescription medicines only as told by your health care provider.  Rest your wrist from any activity that may be causing your pain. If your condition is work related, talk to your employer about changes that can be made, such as getting a wrist pad to use while typing.  If directed, apply ice to the painful area:  Put ice in a plastic bag.  Place a towel between your skin and the bag.  Leave the ice on for 20 minutes, 2-3 times per day.  Keep all follow-up visits as told by your health care provider. This is important.  Do any exercises as told by your health care provider, physical therapist, or occupational therapist. SEEK MEDICAL CARE IF:   You have new symptoms.  Your pain is not controlled with medicines.  Your symptoms get worse.   This information is not intended to replace advice given to  you by your health care provider. Make sure you discuss any questions you have with your health care provider.   Document Released: 03/07/2000 Document Revised: 11/29/2014 Document Reviewed: 07/26/2014 Elsevier Interactive Patient Education 2016 [employer]  Chronic Sinusitis  This is something that should be followed up with your primary care physician.  You should use over the counter saline nose spray in a hot shower daily to try  to help with these symptoms.  If they persist you primary care physician may need to prescribe you a nasal spray.  It does not appear that you have an acute infection in the sinuses at this time.  Serum Creatinine Test   Today your kidney function (creatinine level) was noted to be mildly elevated and your urine looked like it was slightly dehydrated.  You need to make sure that you drink 64 ounces or 8 x 8 ounce glasses of water a day and keep yourself well hydrated especially at work.  This blood test needs to be rechecked with your primary care physician.  WHY AM I HAVING THIS TEST? A creatinine test is performed to measure the amount of creatinine in your blood (serum). Creatinine is a waste product of normal muscle activity (contraction). The kidneys filter creatinine from your blood and remove it from your body through urination. Blood creatinine levels stay consistent in people whose muscle mass stays consistent. This level can be increased in people who perform resistance exercise to increase muscle mass. Because creatinine is removed from your body by the kidneys, this test is often used as a way to measure kidney function. Your health care provider may recommend this test if he or she suspects that you have a condition that is negatively affecting your kidney function. This test may also be done as a part of routine blood work used to assess your overall health. WHAT KIND OF SAMPLE IS TAKEN? A blood sample is required for this test. It is usually collected by inserting a needle into a vein or by sticking a finger with a small needle. For children, the blood sample is usually collected by sticking the child's heel with a small needle. HOW DO I PREPARE FOR THE TEST? There is no preparation or fasting required for this test. WHAT ARE THE REFERENCE RANGES? Reference ranges are considered healthy ranges established after testing a large group of healthy people. Reference ranges may vary among  different people, labs, and hospitals. It is your responsibility to obtain your test results. Ask the lab or department performing the test when and how you will get your results.   Children or adolescents:  Less than 75529 years old: 0.1-0.4 mg/dL.  [id number] years old: 0.2-0.5 mg/dL.  636-40 years old: 0.3-0.6 mg/dL.  7110-40 years old: 0.4-1 mg/dL.  Adult male:  6118-40 years old: 0.5-1 mg/dL.  3941-40 years old: 0.5-1.1 mg/dL.  61 years and older: 0.5-1.2 mg/dL.  Adult male:  3518-40 years old: 0.6-1.2 mg/dL.  3941-40 years old: 0.6-1.3 mg/dL.  61 years and older: 0.7-1.3 mg/dL. The reference range may be higher in people who perform resistance exercise to increase muscle mass. WHAT DO THE RESULTS MEAN? Abnormally high levels of serum creatinine can be caused by many health conditions. These may include:  Kidney disease.  Urinary tract obstruction.  Lower-than-normal blood flow to the kidneys.  Rhabdomyolysis. This occurs when muscle damage causes the release of molecules into the bloodstream, which results in kidney damage.  Acromegaly. This is a condition that causes enlarged bones.  Gigantism. Abnormally low levels of serum creatinine can also be caused by many health conditions. These may include:  Conditions that cause you to be inactive throughout much of the day.  Decreased muscle mass. Talk with your health care provider to discuss your results, treatment options, and if necessary, the need for more tests. Talk with your health care provider if you have any questions about your results.   This information is not intended to replace advice given to you by your health care provider. Make sure you discuss any questions you have with your health care provider.   Document Released: 04/02/2004 Document Revised: 03/31/2014 Document Reviewed: 08/04/2013 Elsevier Interactive Patient Education [employer]

## 2015-03-02 ENCOUNTER — Emergency Department (HOSPITAL_COMMUNITY): Payer: Medicaid Other

## 2015-03-02 ENCOUNTER — Encounter (HOSPITAL_COMMUNITY): Payer: Self-pay | Admitting: *Deleted

## 2015-03-02 ENCOUNTER — Emergency Department (HOSPITAL_COMMUNITY)
Admission: EM | Admit: 2015-03-02 | Discharge: 2015-03-02 | Disposition: A | Discharge status: Home / Self Care | Payer: Medicaid Other | Attending: Emergency Medicine | Admitting: Emergency Medicine

## 2015-03-02 DIAGNOSIS — E032 Hypothyroidism due to medicaments and other exogenous substances: Secondary | ICD-10-CM

## 2015-03-02 DIAGNOSIS — F1210 Cannabis abuse, uncomplicated: Secondary | ICD-10-CM | POA: Insufficient documentation

## 2015-03-02 DIAGNOSIS — R51 Headache: Secondary | ICD-10-CM | POA: Diagnosis present

## 2015-03-02 DIAGNOSIS — Z79899 Other long term (current) drug therapy: Secondary | ICD-10-CM | POA: Diagnosis not present

## 2015-03-02 DIAGNOSIS — E038 Other specified hypothyroidism: Secondary | ICD-10-CM | POA: Insufficient documentation

## 2015-03-02 DIAGNOSIS — Z8669 Personal history of other diseases of the nervous system and sense organs: Secondary | ICD-10-CM | POA: Diagnosis not present

## 2015-03-02 DIAGNOSIS — Z87891 Personal history of nicotine dependence: Secondary | ICD-10-CM | POA: Diagnosis not present

## 2015-03-02 LAB — CBC WITH DIFFERENTIAL/PLATELET
BASOS PCT: 1 %
Basophils Absolute: 0 10*3/uL (ref 0.0–0.1)
Eosinophils Absolute: 0.2 10*3/uL (ref 0.0–0.7)
Eosinophils Relative: 4 %
HEMATOCRIT: 42 % (ref 39.0–52.0)
HEMOGLOBIN: 14.4 g/dL (ref 13.0–17.0)
Lymphocytes Relative: 41 %
Lymphs Abs: 1.9 10*3/uL (ref 0.7–4.0)
MCH: 28.8 pg (ref 26.0–34.0)
MCHC: 34.3 g/dL (ref 30.0–36.0)
MCV: 84 fL (ref 78.0–100.0)
MONOS PCT: 8 %
Monocytes Absolute: 0.4 10*3/uL (ref 0.1–1.0)
NEUTROS ABS: 2.1 10*3/uL (ref 1.7–7.7)
NEUTROS PCT: 46 %
Platelets: 262 10*3/uL (ref 150–400)
RBC: 5 MIL/uL (ref 4.22–5.81)
RDW: 18.3 % — ABNORMAL HIGH (ref 11.5–15.5)
WBC: 4.6 10*3/uL (ref 4.0–10.5)

## 2015-03-02 LAB — COMPREHENSIVE METABOLIC PANEL
ALBUMIN: 4.6 g/dL (ref 3.5–5.0)
ALK PHOS: 82 U/L (ref 38–126)
ALT: 105 U/L — AB (ref 17–63)
AST: 145 U/L — ABNORMAL HIGH (ref 15–41)
Anion gap: 9 (ref 5–15)
BUN: 15 mg/dL (ref 6–20)
CHLORIDE: 99 mmol/L — AB (ref 101–111)
CO2: 27 mmol/L (ref 22–32)
CREATININE: 1.12 mg/dL (ref 0.61–1.24)
Calcium: 9.2 mg/dL (ref 8.9–10.3)
GFR calc Af Amer: >60 mL/min (ref >60)
GFR calc non Af Amer: >60 mL/min (ref >60)
GLUCOSE: 88 mg/dL (ref 65–99)
Potassium: 3.7 mmol/L (ref 3.5–5.1)
SODIUM: 135 mmol/L (ref 135–145)
Total Bilirubin: 0.4 mg/dL (ref 0.3–1.2)
Total Protein: 7.4 g/dL (ref 6.5–8.1)

## 2015-03-02 LAB — I-STAT TROPONIN, ED: Troponin i, poc: 0 ng/mL (ref 0.00–0.08)

## 2015-03-02 LAB — RAPID URINE DRUG SCREEN, HOSP PERFORMED
AMPHETAMINES: NOT DETECTED
BENZODIAZEPINES: NOT DETECTED
Barbiturates: NOT DETECTED
COCAINE: NOT DETECTED
OPIATES: NOT DETECTED
TETRAHYDROCANNABINOL: POSITIVE — AB

## 2015-03-02 LAB — CBC
HCT: 42.4 % (ref 39.0–52.0)
HEMOGLOBIN: 14.8 g/dL (ref 13.0–17.0)
MCH: 28.9 pg (ref 26.0–34.0)
MCHC: 34.9 g/dL (ref 30.0–36.0)
MCV: 82.8 fL (ref 78.0–100.0)
PLATELETS: 262 10*3/uL (ref 150–400)
RBC: 5.12 MIL/uL (ref 4.22–5.81)
RDW: 18.2 % — ABNORMAL HIGH (ref 11.5–15.5)
WBC: 4.6 10*3/uL (ref 4.0–10.5)

## 2015-03-02 LAB — URINALYSIS, ROUTINE W REFLEX MICROSCOPIC
BILIRUBIN URINE: NEGATIVE
GLUCOSE, UA: NEGATIVE mg/dL
HGB URINE DIPSTICK: NEGATIVE
KETONES UR: NEGATIVE mg/dL
Leukocytes, UA: NEGATIVE
Nitrite: NEGATIVE
PH: 6 (ref 5.0–8.0)
Protein, ur: NEGATIVE mg/dL
SPECIFIC GRAVITY, URINE: 1.021 (ref 1.005–1.030)

## 2015-03-02 LAB — PROTIME-INR
INR: 1.04 (ref 0.00–1.49)
Prothrombin Time: 13.8 seconds (ref 11.6–15.2)

## 2015-03-02 LAB — CBG MONITORING, ED: GLUCOSE-CAPILLARY: 95 mg/dL (ref 65–99)

## 2015-03-02 LAB — TSH: TSH: 53.013 u[IU]/mL — ABNORMAL HIGH (ref 0.350–4.500)

## 2015-03-02 LAB — ETHANOL: Alcohol, Ethyl (B): <5 mg/dL (ref <5)

## 2015-03-02 MED ORDER — SODIUM CHLORIDE 0.9 % IV BOLUS (SEPSIS)
1000.0000 mL | Freq: Once | INTRAVENOUS | Status: AC
  Administered 2015-03-02: 1000 mL via INTRAVENOUS

## 2015-03-02 MED ORDER — DEXAMETHASONE SODIUM PHOSPHATE 10 MG/ML IJ SOLN
10.0000 mg | Freq: Once | INTRAMUSCULAR | Status: AC
  Administered 2015-03-02: 10 mg via INTRAVENOUS
  Filled 2015-03-02: qty 1

## 2015-03-02 MED ORDER — KETOROLAC TROMETHAMINE 30 MG/ML IJ SOLN
30.0000 mg | Freq: Once | INTRAMUSCULAR | Status: AC
  Administered 2015-03-02: 30 mg via INTRAVENOUS
  Filled 2015-03-02: qty 1

## 2015-03-02 MED ORDER — LEVOTHYROXINE SODIUM 25 MCG PO TABS: ORAL_TABLET | ORAL | Status: DC

## 2015-03-02 MED ORDER — LEVOTHYROXINE SODIUM 175 MCG PO TABS: 175.0000 ug | ORAL_TABLET | Freq: Every day | ORAL | Status: DC

## 2015-03-02 NOTE — ED Notes (Addendum)
Pt reports headache, lip swelling, and slurred speech x1.5 weeks. Reports he has about 4-5 episodes/day that last for a few minutes. Reports frontal headache 5/10. Has had intermittent metalic taste in mouth. No neuro deficits found, face symmetrical, hand and foot strength bilateral equal and strong. Denies blurry vision, dizziness, or LOC.   Pt also reports bil arm pain from shoulders to elbows. Pt was seen in ED a few months ago and dx with carpal tunnel, has had this pain for several months. Has swelling in hands since August as well. Waiting to have surgery in January.  Todays visit focus is on slurred speech. Pain 7/10.

## 2015-03-02 NOTE — Discharge Instructions (Signed)
Hypothyroidism Hypothyroidism is a disorder of the thyroid. The thyroid is a large gland that is located in the lower front of the neck. The thyroid releases hormones that control how the body works. With hypothyroidism, the thyroid does not make enough of these hormones. CAUSES Causes of hypothyroidism may include:  Viral infections.  Pregnancy.  Your own defense system (immune system) attacking your thyroid.  Certain medicines.  Birth defects.  Past radiation treatments to your head or neck.  Past treatment with radioactive iodine.  Past surgical removal of part or all of your thyroid.  Problems with the gland that is located in the center of your brain (pituitary). SIGNS AND SYMPTOMS Signs and symptoms of hypothyroidism may include:  Feeling as though you have no energy (lethargy).  Inability to tolerate cold.  Weight gain that is not explained by a change in diet or exercise habits.  Dry skin.  Coarse hair.  Menstrual irregularity.  Slowing of thought processes.  Constipation.  Sadness or depression. DIAGNOSIS  Your health care provider may diagnose hypothyroidism with blood tests and ultrasound tests. TREATMENT Hypothyroidism is treated with medicine that replaces the hormones that your body does not make. After you begin treatment, it may take several weeks for symptoms to go away. HOME CARE INSTRUCTIONS   Take medicines only as directed by your health care provider.  If you start taking any new medicines, tell your health care provider.  Keep all follow-up visits as directed by your health care provider. This is important. As your condition improves, your dosage needs may change. You will need to have blood tests regularly so that your health care provider can watch your condition. SEEK MEDICAL CARE IF:  Your symptoms do not get better with treatment.  You are taking thyroid replacement medicine and:  You sweat excessively.  You have tremors.  You  feel anxious.  You lose weight rapidly.  You cannot tolerate heat.  You have emotional swings.  You have diarrhea.  You feel weak. SEEK IMMEDIATE MEDICAL CARE IF:   You develop chest pain.  You develop an irregular heartbeat.  You develop a rapid heartbeat.   This information is not intended to replace advice given to you by your health care provider. Make sure you discuss any questions you have with your health care provider.   Document Released: 03/10/2005 Document Revised: 03/31/2014 Document Reviewed: 07/26/2013 Elsevier Interactive Patient Education 2016 [employer]  Thyroid-Stimulating Hormone Test WHY AM I HAVING THIS TEST? A thyroid-stimulating hormone (TSH) test is a blood test that is done to measure the level of TSH, also known as thyrotropin, in your blood. TSH is produced by the pituitary gland. The pituitary gland is a small organ located just below the brain, behind your eyes and nasal passages. It is part of a system that monitors and maintains thyroid hormone levels and thyroid gland function. Thyroid hormones affect many body parts and systems, including the system that affects how quickly your body burns fuel for energy. Your health care provider may recommend testing your TSH level if you have signs and symptoms of abnormal thyroid hormone levels. Knowing the level of TSH in your blood can help your health care provider:  Diagnose a thyroid gland or pituitary gland disorder.  Manage your condition and treatment if you have hypothyroidism or hyperthyroidism. WHAT KIND OF SAMPLE IS TAKEN? A blood sample is required for this test. It is usually collected by inserting a needle into a vein. HOW DO I PREPARE FOR  THE TEST? There is no preparation required for this test. WHAT ARE THE REFERENCE RANGES? Reference rangesare considered healthy rangesestablished after testing a large group of healthy people. Reference rangesmay vary among different people, labs, and  hospitals. It is your responsibility to obtain your test results. Ask the lab or department performing the test when and how you will get your results. Range of Normal Values:  Adult: 0.3-5 microunits/mL or 0.3-5 milliunits/L (SI units).  Newborn: 27-18 microunits/mL or 3-18 milliunits/L.  Cord: 3-12 microunits/mL or 3-12 milliunits/L. WHAT DO THE RESULTS MEAN? A high level of TSH may mean:  Your thyroid gland is not making enough thyroid hormones. When the thyroid gland does not make enough thyroid hormones, the pituitary gland releases TSH into the bloodstream. The higher-than-normal levels of TSH prompt the thyroid gland to release more thyroid hormones.  You are getting an insufficient level of thyroid hormone medicine, if you are receiving this type of treatment.  There is a problem with the pituitary gland (rare). A low level of TSH can indicate a problem with the pituitary gland. Talk with your health care provider to discuss your results, treatment options, and if necessary, the need for more tests. Talk with your health care provider if you have any questions about your results.   This information is not intended to replace advice given to you by your health care provider. Make sure you discuss any questions you have with your health care provider.   Document Released: 04/04/2004 Document Revised: 03/31/2014 Document Reviewed: 08/03/2013 Elsevier Interactive Patient Education [employer]

## 2015-03-02 NOTE — ED Provider Notes (Signed)
Medical screening examination/treatment/procedure(s) were conducted as a shared visit with non-physician practitioner(s) and myself.  I personally evaluated the patient during the encounter.   EKG Interpretation   Date/Time:  Friday March 02 2015 12:58:21 EST Ventricular Rate:  49 PR Interval:  158 QRS Duration: 171 QT Interval:  404 QTC Calculation: 365 R Axis:   105 Text Interpretation:  Right and left arm electrode reversal,  interpretation assumes no reversal Sinus bradycardia baseline wander in I,  II, III Confirmed by Freida Nebel MD, Emmons Toth [phone]([id number]) on 03/02/2015 3:14:3034 PM      39 year old male who presents with intermittent headaches, fatigue, swelling of his arms, cold intolerance progressively worsening since radioiodine ablation of the thyroid. Symptoms concerning for progressive hypothyroidism. Followed by Dr. Chestine Sporelark from endocrinology, who has not yet started him on any thyroid replacement. Is alert and fully oriented, with no concern for altered mental status. Is neurologically intact. Has mild bradycardia, but is normotensive and appears well perfused. No evidence of hyperthermia or major metabolic derangements. At this time, the presentation is not concerning for myxedema crisis. He does have hypothyroidism with TSH of 50, trained in from his last TSH of around 20 with his last visit by Dr. Chestine Sporelark. Multiple attempts were made to contact Dr. Chestine Sporelark and on call endocrinologist to discuss initiation thyroid replacement and close follow-up, but unable to get a hold of. Will start initiating dose of thyroid medication and discussed close follow-up with the patient. Strict return and follow-up instructions reviewed. He expressed understanding of all discharge instructions and felt comfortable with the plan of care.   Lavera Guiseana Duo Bryson Palen, MD 03/02/15 [phone]

## 2015-03-02 NOTE — ED Provider Notes (Signed)
CSN: [id number]     Arrival date & time 03/02/15  1050 History   First MD Initiated Contact with Patient 03/02/15 1148     Chief Complaint  Patient presents with  . Slurred Speech x1.5 weeks     . Headache     (Consider location/radiation/quality/duration/timing/severity/associated sxs/prior Treatment) The history is provided by the patient and the spouse.    Pt is a 39 year old male with history of thyroid disease and bilateral carpal tunnel syndrome, presents to the emergency room approximately one and half weeks of frequent headaches with associated slurred speech, noticed by his wife.  The pt's headaches occur 8-10x a day, lasts 10-30 minutes, located across temples and forehead, described as throbbing, rate 5/10 in severity.  The pt denies any associated sx while having the headaches.  He denies fever, chills, sweats, neck pain, vomiting, visual disturbances, LOC.  He treats with ibuprofen and tylenol.  He reports diarrhea for 1.5wks, roughly 2x a day, large volume watery diarrhea.  He denies hematochezia, melena. Otherwise the patient has a pertinent recent history of hyperthyroid disease which was diagnosed earlier in the year and treated with methamazole and iodine ablation. Treatment recently stopped in August, he is seen by endocrinologist Dr. Margaretmary Bayley.  He reports 2 visits for TSH monitoring but has not yet been prescribed any Synthroid.  Last testing was 2 weeks ago, he was waiting to hear back from his doctor.  Over the past several months he has had progressive swelling and bilateral arms, hands, face and lips. He endorses feeling severely cold, when others are not cold. He states that his fingertips and hands remained very cold and frequently his fingers will turn blue.  He has been diagnosed with carpal tunnel, and bilaterally his symptoms have worsened. He states that he is currently pending nerve conduction studies by Dr. Merlyn Lot.    Past Medical History  Diagnosis Date  .  Hypercholesterolemia   . Thyroid disease    History reviewed. No pertinent past surgical history. History reviewed. No pertinent family history. Social History  Substance Use Topics  . Smoking status: Former Games developer  . Smokeless tobacco: None  . Alcohol Use: No    Review of Systems  Constitutional: Positive for fatigue and unexpected weight change. Negative for fever, chills, diaphoresis, activity change and appetite change.  Eyes: Negative.   Respiratory: Negative.   Cardiovascular: Negative.   Endocrine: Positive for cold intolerance. Negative for heat intolerance, polydipsia, polyphagia and polyuria.  Genitourinary: Negative.   Allergic/Immunologic: Negative.   Neurological: Negative for dizziness, tremors, seizures, syncope, facial asymmetry, speech difficulty and light-headedness.  Hematological: Negative.   All other systems reviewed and are negative.     Allergies  Review of patient's allergies indicates no known allergies.  Home Medications   Prior to Admission medications   Medication Sig Start Date End Date Taking? Authorizing Provider  acetaminophen (TYLENOL) 500 MG tablet Take 1,000 mg by mouth every 6 (six) hours as needed for moderate pain or headache.   Yes Historical Provider, MD  Multiple Vitamins-Minerals (MULTIVITAMIN & MINERAL PO) Take 1 tablet by mouth daily.   Yes Historical Provider, MD  levothyroxine (SYNTHROID, LEVOTHROID) 25 MCG tablet Take 1 tablet (25 mcg) PO daily before breakfast for 7d, then take 2 tablets (50 mcg total) PO daily before breakfast for 7d. 03/02/15   Bracen Schum, PA-C   BP 135/95 mmHg  Pulse 59  Temp(Src) 97.9 F (36.6 C) (Oral)  Resp 18  SpO2 100% Physical Exam  Constitutional: He is oriented to person, place, and time. He appears well-developed and well-nourished. No distress.  HENT:  Head: Normocephalic and atraumatic.  Nose: Nose normal.  Mouth/Throat: Oropharynx is clear and moist. No oropharyngeal exudate.  Eyes:  Conjunctivae and EOM are normal. Pupils are equal, round, and reactive to light. Right eye exhibits no discharge. Left eye exhibits no discharge. No scleral icterus.  Neck: Normal range of motion. No JVD present. No tracheal deviation present. No thyromegaly present.  Cardiovascular: Normal rate, regular rhythm, normal heart sounds and intact distal pulses.  Exam reveals no gallop and no friction rub.   No murmur heard. Pulmonary/Chest: Effort normal and breath sounds normal. No respiratory distress. He has no wheezes. He has no rales. He exhibits no tenderness.  Abdominal: Soft. Bowel sounds are normal. He exhibits no distension and no mass. There is no tenderness. There is no rebound and no guarding.  Musculoskeletal: Normal range of motion. He exhibits no edema.       Right wrist: He exhibits tenderness and swelling. He exhibits normal range of motion, no bony tenderness, no effusion, no crepitus and no deformity.       Left wrist: He exhibits tenderness and swelling. He exhibits normal range of motion, no bony tenderness, no effusion, no crepitus and no deformity.  Lymphadenopathy:    He has no cervical adenopathy.  Neurological: He is alert and oriented to person, place, and time. He has normal strength and normal reflexes. He is not disoriented. No cranial nerve deficit or sensory deficit. He exhibits normal muscle tone. Coordination and gait normal. GCS eye subscore is 4. GCS verbal subscore is 5. GCS motor subscore is 6.  Speech is clear and goal oriented, follows commands Major Cranial nerves without deficit, no facial droop Normal strength in upper and lower extremities bilaterally including dorsiflexion and plantar flexion, strong and equal grip strength Sensation normal to light and sharp touch Moves extremities without ataxia, coordination intact Normal finger to nose and rapid alternating movements Neg romberg, no pronator drift Normal gait and balance   Skin: Skin is warm and dry.  No rash noted. He is not diaphoretic. No erythema. No pallor.  Diffuse non-pitting edema of bilateral hands, arms  No rash   Psychiatric: He has a normal mood and affect. His behavior is normal. Judgment and thought content normal.  Nursing note and vitals reviewed.   ED Course  Procedures (including critical care time) Labs Review Labs Reviewed  CBC - Abnormal; Notable for the following:    RDW 18.2 (*)    All other components within normal limits  COMPREHENSIVE METABOLIC PANEL - Abnormal; Notable for the following:    Chloride 99 (*)    AST 145 (*)    ALT 105 (*)    All other components within normal limits  TSH - Abnormal; Notable for the following:    TSH 53.013 (*)    All other components within normal limits  CBC WITH DIFFERENTIAL/PLATELET - Abnormal; Notable for the following:    RDW 18.3 (*)    All other components within normal limits  URINE RAPID DRUG SCREEN, HOSP PERFORMED - Abnormal; Notable for the following:    Tetrahydrocannabinol POSITIVE (*)    All other components within normal limits  ETHANOL  URINALYSIS, ROUTINE W REFLEX MICROSCOPIC (NOT AT [facility])  PROTIME-INR  CBG MONITORING, ED  CBG MONITORING, ED  I-STAT TROPOININ, ED    Imaging Review No results found. I have personally reviewed and evaluated these images  and lab results as part of my medical decision-making.   EKG Interpretation   Date/Time:  Friday March 02 2015 12:58:21 EST Ventricular Rate:  49 PR Interval:  158 QRS Duration: 171 QT Interval:  404 QTC Calculation: 365 R Axis:   105 Text Interpretation:  Right and left arm electrode reversal,  interpretation assumes no reversal Sinus bradycardia baseline wander in I,  II, III Confirmed by LIU MD, DANA ([id number]) on 03/02/2015 3:14:34 PM      MDM   Final diagnoses:  Iatrogenic hypothyroidism   Pt with intermittent HA's x 1.5 weeks.  Patient does not report any associated symptoms with his headaches are present however his wife  states he has recently had slurred speech. He does not have any slurred speech currently. He does have diffuse swelling to his bilateral upper extremities.  Patient's history is concerning for recent thyroid ablation, and suspect hypothyroidism large component of many of his symptoms today.  Stroke/weakness work up initiated with head ct.  TSH ordered.  Workup is pertinent for positive THC, elevated AST ALT CT of head was negative for acute intracranial abnormality. There is evidence of chronic bilateral maxillary sinusitis, patient states he doesn't chronic sinus issues, no recent worsening, he does not believe his sinuses are related to his recent headaches.  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 Pt's TSH elevated to 53.  Multiple attempts were made to contact the patient's endocrinologist, without any success. The physician who was answering call for his endocrinologist did contact the ER and said that it was not his patient.  Were discussed with the attending physician Dr. Verdie Mosher.  The plan was to provide the patient with thyroid hormone, and he is to follow-up with his endocrinologist as soon as possible.    Pt was given synthroid.  Dosing was prescribed at full-dose, and this was adjusted with the pharmacists where the pt receives meds.  Pt had synthroid initiated at 25 mcg per day for 1 week, with increase the 2nd week to 50 mcg.  The pt was contacted and instructed not to fill the full weight-based dosing.  He agreed to follow up with Dr. Chestine Spore on Monday for review of tx and further tx.  Pt was discharged in satisfactory condition, VSS, pt ambulatory w/out difficulty.    Dr. Verdie Mosher has personally seen and evaluated the pt and is in agreement with w/up and plan.    Danelle Berry, PA-C 03/19/15 1501  Lavera Guise, MD 03/21/15 [phone]

## 2015-03-22 NOTE — Progress Notes (Signed)
The pharmacy was called to d/c rx.  The pt had already filled it.  THe pt was then called, who agreed to not take medication and get instead gradual lower dose to start.  Also agreed to f/up with endocrinologist ASAP to review their preferred dosing.

## 2015-04-02 ENCOUNTER — Other Ambulatory Visit: Payer: Self-pay | Admitting: Orthopedic Surgery

## 2015-04-05 ENCOUNTER — Encounter (HOSPITAL_BASED_OUTPATIENT_CLINIC_OR_DEPARTMENT_OTHER): Payer: Self-pay | Admitting: *Deleted

## 2015-04-12 ENCOUNTER — Encounter (HOSPITAL_BASED_OUTPATIENT_CLINIC_OR_DEPARTMENT_OTHER)
Admission: RE | Disposition: A | Discharge status: Home / Self Care | Payer: Self-pay | Source: Ambulatory Visit | Attending: Orthopedic Surgery

## 2015-04-12 ENCOUNTER — Encounter (HOSPITAL_BASED_OUTPATIENT_CLINIC_OR_DEPARTMENT_OTHER): Payer: Self-pay | Admitting: *Deleted

## 2015-04-12 ENCOUNTER — Ambulatory Visit (HOSPITAL_BASED_OUTPATIENT_CLINIC_OR_DEPARTMENT_OTHER)
Admission: RE | Admit: 2015-04-12 | Discharge: 2015-04-12 | Disposition: A | Discharge status: Home / Self Care | Payer: Medicaid Other | Source: Ambulatory Visit | Attending: Orthopedic Surgery | Admitting: Orthopedic Surgery

## 2015-04-12 ENCOUNTER — Ambulatory Visit (HOSPITAL_BASED_OUTPATIENT_CLINIC_OR_DEPARTMENT_OTHER): Payer: Medicaid Other | Admitting: Anesthesiology

## 2015-04-12 DIAGNOSIS — E039 Hypothyroidism, unspecified: Secondary | ICD-10-CM | POA: Insufficient documentation

## 2015-04-12 DIAGNOSIS — G5603 Carpal tunnel syndrome, bilateral upper limbs: Secondary | ICD-10-CM | POA: Diagnosis not present

## 2015-04-12 DIAGNOSIS — Z79899 Other long term (current) drug therapy: Secondary | ICD-10-CM | POA: Insufficient documentation

## 2015-04-12 DIAGNOSIS — Z87891 Personal history of nicotine dependence: Secondary | ICD-10-CM | POA: Diagnosis not present

## 2015-04-12 DIAGNOSIS — E7800 Pure hypercholesterolemia, unspecified: Secondary | ICD-10-CM | POA: Insufficient documentation

## 2015-04-12 HISTORY — DX: Hypothyroidism, unspecified: E03.9

## 2015-04-12 HISTORY — PX: BILATERAL CARPAL TUNNEL RELEASE: SHX6508

## 2015-04-12 SURGERY — BILATERAL CARPAL TUNNEL RELEASE
Anesthesia: General | Site: Hand | Laterality: Bilateral

## 2015-04-12 MED ORDER — LIDOCAINE HCL (CARDIAC) 20 MG/ML IV SOLN
INTRAVENOUS | Status: AC
  Filled 2015-04-12: qty 5

## 2015-04-12 MED ORDER — ONDANSETRON HCL 4 MG/2ML IJ SOLN
INTRAMUSCULAR | Status: DC | PRN
  Administered 2015-04-12: 4 mg via INTRAVENOUS

## 2015-04-12 MED ORDER — BUPIVACAINE HCL (PF) 0.25 % IJ SOLN
INTRAMUSCULAR | Status: DC | PRN
  Administered 2015-04-12: 10 mL

## 2015-04-12 MED ORDER — FENTANYL CITRATE (PF) 100 MCG/2ML IJ SOLN
50.0000 ug | INTRAMUSCULAR | Status: AC | PRN
  Administered 2015-04-12: 50 ug via INTRAVENOUS
  Administered 2015-04-12: 100 ug via INTRAVENOUS
  Administered 2015-04-12: 50 ug via INTRAVENOUS

## 2015-04-12 MED ORDER — DEXAMETHASONE SODIUM PHOSPHATE 10 MG/ML IJ SOLN
INTRAMUSCULAR | Status: AC
  Filled 2015-04-12: qty 1

## 2015-04-12 MED ORDER — ONDANSETRON HCL 4 MG/2ML IJ SOLN
INTRAMUSCULAR | Status: AC
  Filled 2015-04-12: qty 2

## 2015-04-12 MED ORDER — OXYCODONE-ACETAMINOPHEN 5-325 MG PO TABS: ORAL_TABLET | ORAL | Status: DC

## 2015-04-12 MED ORDER — PROPOFOL 10 MG/ML IV BOLUS
INTRAVENOUS | Status: DC | PRN
  Administered 2015-04-12: 200 mg via INTRAVENOUS

## 2015-04-12 MED ORDER — CHLORHEXIDINE GLUCONATE 4 % EX LIQD: 60.0000 mL | Freq: Once | CUTANEOUS | Status: DC

## 2015-04-12 MED ORDER — CEFAZOLIN SODIUM-DEXTROSE 2-3 GM-% IV SOLR
2.0000 g | INTRAVENOUS | Status: AC
  Administered 2015-04-12: 2 g via INTRAVENOUS

## 2015-04-12 MED ORDER — GLYCOPYRROLATE 0.2 MG/ML IJ SOLN: 0.2000 mg | Freq: Once | INTRAMUSCULAR | Status: DC | PRN

## 2015-04-12 MED ORDER — MIDAZOLAM HCL 2 MG/2ML IJ SOLN
INTRAMUSCULAR | Status: AC
  Filled 2015-04-12: qty 2

## 2015-04-12 MED ORDER — FENTANYL CITRATE (PF) 100 MCG/2ML IJ SOLN
25.0000 ug | INTRAMUSCULAR | Status: DC | PRN
  Administered 2015-04-12 (×2): 50 ug via INTRAVENOUS

## 2015-04-12 MED ORDER — CEFAZOLIN SODIUM-DEXTROSE 2-3 GM-% IV SOLR
INTRAVENOUS | Status: AC
  Filled 2015-04-12: qty 50

## 2015-04-12 MED ORDER — DEXAMETHASONE SODIUM PHOSPHATE 10 MG/ML IJ SOLN
INTRAMUSCULAR | Status: DC | PRN
  Administered 2015-04-12: 10 mg via INTRAVENOUS

## 2015-04-12 MED ORDER — OXYCODONE HCL 5 MG PO TABS
ORAL_TABLET | ORAL | Status: AC
  Filled 2015-04-12: qty 1

## 2015-04-12 MED ORDER — MEPERIDINE HCL 25 MG/ML IJ SOLN: 6.2500 mg | INTRAMUSCULAR | Status: DC | PRN

## 2015-04-12 MED ORDER — FENTANYL CITRATE (PF) 100 MCG/2ML IJ SOLN
INTRAMUSCULAR | Status: AC
  Filled 2015-04-12: qty 2

## 2015-04-12 MED ORDER — PROPOFOL 500 MG/50ML IV EMUL
INTRAVENOUS | Status: AC
  Filled 2015-04-12: qty 50

## 2015-04-12 MED ORDER — LACTATED RINGERS IV SOLN: 500.0000 mL | INTRAVENOUS | Status: DC

## 2015-04-12 MED ORDER — OXYCODONE HCL 5 MG PO TABS
5.0000 mg | ORAL_TABLET | Freq: Once | ORAL | Status: AC
  Administered 2015-04-12: 5 mg via ORAL

## 2015-04-12 MED ORDER — LIDOCAINE HCL (CARDIAC) 20 MG/ML IV SOLN
INTRAVENOUS | Status: DC | PRN
  Administered 2015-04-12: 60 mg via INTRAVENOUS

## 2015-04-12 MED ORDER — ATROPINE SULFATE 0.4 MG/ML IJ SOLN
INTRAMUSCULAR | Status: AC
  Filled 2015-04-12: qty 1

## 2015-04-12 MED ORDER — LACTATED RINGERS IV SOLN: INTRAVENOUS | Status: DC

## 2015-04-12 MED ORDER — LACTATED RINGERS IV SOLN
INTRAVENOUS | Status: DC
  Administered 2015-04-12 (×2): via INTRAVENOUS

## 2015-04-12 MED ORDER — DEXAMETHASONE SODIUM PHOSPHATE 4 MG/ML IJ SOLN
INTRAMUSCULAR | Status: DC | PRN
Start: 2015-04-12 — End: 2015-04-12
  Administered 2015-04-12: 10 mg via INTRAVENOUS

## 2015-04-12 MED ORDER — SCOPOLAMINE 1 MG/3DAYS TD PT72: 1.0000 | MEDICATED_PATCH | Freq: Once | TRANSDERMAL | Status: DC | PRN

## 2015-04-12 MED ORDER — MIDAZOLAM HCL 2 MG/2ML IJ SOLN
1.0000 mg | INTRAMUSCULAR | Status: DC | PRN
  Administered 2015-04-12: 2 mg via INTRAVENOUS

## 2015-04-12 MED ORDER — SUCCINYLCHOLINE CHLORIDE 20 MG/ML IJ SOLN
INTRAMUSCULAR | Status: AC
  Filled 2015-04-12: qty 1

## 2015-04-12 MED ORDER — PROMETHAZINE HCL 25 MG/ML IJ SOLN: 6.2500 mg | INTRAMUSCULAR | Status: DC | PRN

## 2015-04-12 MED ORDER — PHENYLEPHRINE 40 MCG/ML (10ML) SYRINGE FOR IV PUSH (FOR BLOOD PRESSURE SUPPORT)
PREFILLED_SYRINGE | INTRAVENOUS | Status: AC
  Filled 2015-04-12: qty 10

## 2015-04-12 MED ORDER — EPHEDRINE SULFATE 50 MG/ML IJ SOLN
INTRAMUSCULAR | Status: AC
  Filled 2015-04-12: qty 1

## 2015-04-12 SURGICAL SUPPLY — 33 items
BANDAGE ACE 3X5.8 VEL STRL LF (GAUZE/BANDAGES/DRESSINGS) ×6 IMPLANT
BLADE SURG 15 STRL LF DISP TIS (BLADE) ×2 IMPLANT
BLADE SURG 15 STRL SS (BLADE) ×4
BNDG ESMARK 4X9 LF (GAUZE/BANDAGES/DRESSINGS) ×3 IMPLANT
BNDG GAUZE ELAST 4 BULKY (GAUZE/BANDAGES/DRESSINGS) ×6 IMPLANT
CHLORAPREP W/TINT 26ML (MISCELLANEOUS) ×6 IMPLANT
CORDS BIPOLAR (ELECTRODE) ×3 IMPLANT
COVER BACK TABLE 60X90IN (DRAPES) ×3 IMPLANT
COVER MAYO STAND STRL (DRAPES) ×6 IMPLANT
CUFF TOURNIQUET SINGLE 18IN (TOURNIQUET CUFF) ×6 IMPLANT
DERMABOND ADVANCED (GAUZE/BANDAGES/DRESSINGS) ×4
DERMABOND ADVANCED .7 DNX12 (GAUZE/BANDAGES/DRESSINGS) ×2 IMPLANT
DRAPE EXTREMITY T 121X128X90 (DRAPE) ×6 IMPLANT
DRAPE SURG 17X23 STRL (DRAPES) ×6 IMPLANT
DRSG PAD ABDOMINAL 8X10 ST (GAUZE/BANDAGES/DRESSINGS) ×6 IMPLANT
GAUZE SPONGE 4X4 12PLY STRL (GAUZE/BANDAGES/DRESSINGS) ×6 IMPLANT
GLOVE BIO SURGEON STRL SZ7.5 (GLOVE) ×6 IMPLANT
GLOVE BIOGEL PI IND STRL 8 (GLOVE) ×2 IMPLANT
GLOVE BIOGEL PI INDICATOR 8 (GLOVE) ×4
GLOVE SURG SS PI 7.5 STRL IVOR (GLOVE) ×3 IMPLANT
GOWN STRL REUS W/ TWL LRG LVL3 (GOWN DISPOSABLE) ×1 IMPLANT
GOWN STRL REUS W/TWL LRG LVL3 (GOWN DISPOSABLE) ×2
GOWN STRL REUS W/TWL XL LVL3 (GOWN DISPOSABLE) ×6 IMPLANT
NEEDLE HYPO 25X1 1.5 SAFETY (NEEDLE) IMPLANT
NS IRRIG 1000ML POUR BTL (IV SOLUTION) ×3 IMPLANT
PACK BASIN DAY SURGERY FS (CUSTOM PROCEDURE TRAY) ×3 IMPLANT
SPONGE GAUZE 4X4 12PLY STER LF (GAUZE/BANDAGES/DRESSINGS) ×6 IMPLANT
STOCKINETTE 4X48 STRL (DRAPES) ×6 IMPLANT
SUT MON AB 5-0 PS2 18 (SUTURE) ×3 IMPLANT
SYR BULB 3OZ (MISCELLANEOUS) ×3 IMPLANT
SYR CONTROL 10ML LL (SYRINGE) IMPLANT
TOWEL OR 17X24 6PK STRL BLUE (TOWEL DISPOSABLE) ×6 IMPLANT
UNDERPAD 30X30 (UNDERPADS AND DIAPERS) ×6 IMPLANT

## 2015-04-12 NOTE — Op Note (Signed)
[id number]  

## 2015-04-12 NOTE — Anesthesia Procedure Notes (Signed)
Procedure Name: LMA Insertion Date/Time: 04/12/2015 11:26 AM Performed by: Burna Cash Pre-anesthesia Checklist: Patient identified, Emergency Drugs available, Suction available and Patient being monitored Patient Re-evaluated:Patient Re-evaluated prior to inductionOxygen Delivery Method: Circle System Utilized Preoxygenation: Pre-oxygenation with 100% oxygen Intubation Type: IV induction Ventilation: Mask ventilation without difficulty LMA: LMA inserted LMA Size: 4.5 Number of attempts: 1 Airway Equipment and Method: Bite block Placement Confirmation: positive ETCO2 Tube secured with: Tape Dental Injury: Teeth and Oropharynx as per pre-operative assessment

## 2015-04-12 NOTE — Brief Op Note (Signed)
04/12/2015  12:51 PM  PATIENT:  Larry Thomas  40 y.o. male  PRE-OPERATIVE DIAGNOSIS:  Severe Bilateral Carpal Tunnel Syndrome  G56.0  POST-OPERATIVE DIAGNOSIS:  Severe Bilateral Carpal Tunnel Syndome  PROCEDURE:  Procedure(s): BILATERAL CARPAL TUNNEL RELEASE (Bilateral)  SURGEON:  Surgeon(s) and Role:    * Betha Loa, MD - Primary  PHYSICIAN ASSISTANT:   ASSISTANTS: none   ANESTHESIA:   general  EBL:  Total I/O In: 1000 [I.V.:1000] Out: -   BLOOD ADMINISTERED:none  DRAINS: none   LOCAL MEDICATIONS USED:  MARCAINE     SPECIMEN:  No Specimen  DISPOSITION OF SPECIMEN:  N/A  COUNTS:  YES  TOURNIQUET:   Total Tourniquet Time Documented: Upper Arm (Right) - 36 minutes Total: Upper Arm (Right) - 36 minutes  Forearm (Left) - 33 minutes Total: Forearm (Left) - 33 minutes   DICTATION: .Other Dictation: Dictation Number [id number]  PLAN OF CARE: Discharge to home after PACU  PATIENT DISPOSITION:  PACU - hemodynamically stable.

## 2015-04-12 NOTE — Transfer of Care (Signed)
Immediate Anesthesia Transfer of Care Note  Patient: Larry Thomas  Procedure(s) Performed: Procedure(s): BILATERAL CARPAL TUNNEL RELEASE (Bilateral)  Patient Location: PACU  Anesthesia Type:General  Level of Consciousness: awake, alert  and oriented  Airway & Oxygen Therapy: Patient Spontanous Breathing and Patient connected to face mask oxygen  Post-op Assessment: Report given to RN and Post -op Vital signs reviewed and stable  Post vital signs: Reviewed and stable  Last Vitals:  Filed Vitals:   04/12/15 0848  BP: 133/85  Pulse: 62  Temp: 36.8 C  Resp: 18    Complications: No apparent anesthesia complications

## 2015-04-12 NOTE — Anesthesia Postprocedure Evaluation (Signed)
Anesthesia Post Note  Patient: Larry Thomas  Procedure(s) Performed: Procedure(s) (LRB): BILATERAL CARPAL TUNNEL RELEASE (Bilateral)  Patient location during evaluation: PACU Anesthesia Type: General Level of consciousness: awake and alert and patient cooperative Pain management: pain level controlled Vital Signs Assessment: post-procedure vital signs reviewed and stable Respiratory status: spontaneous breathing and respiratory function stable Cardiovascular status: stable Anesthetic complications: no    Last Vitals:  Filed Vitals:   04/12/15 1315 04/12/15 1345  BP: 153/86   Pulse: 64 68  Temp:    Resp: 18 14    Last Pain:  Filed Vitals:   04/12/15 1346  PainSc: 4                  Surafel Hilleary S

## 2015-04-12 NOTE — H&P (Signed)
  Larry Thomas is an 40 y.o. male.   Chief Complaint: bilateral carpal tunnel syndrome HPI: 40 yo male with numbness and tingling bilateral hands.  Nocturnal symptoms wake him and cause him to shake his hands for relief.  Positive nerve conduction studies.  He wishes to have bilateral carpal tunnel releases for management of his symptoms.  Allergies: No Known Allergies  Past Medical History  Diagnosis Date  . Hypercholesterolemia   . Thyroid disease   . Hypothyroidism     History reviewed. No pertinent past surgical history.  Family History: History reviewed. No pertinent family history.  Social History:   reports that he has quit smoking. He does not have any smokeless tobacco history on file. He reports that he does not drink alcohol or use illicit drugs.  Medications: Medications Prior to Admission  Medication Sig Dispense Refill  . acetaminophen (TYLENOL) 500 MG tablet Take 1,000 mg by mouth every 6 (six) hours as needed for moderate pain or headache.    . levothyroxine (SYNTHROID, LEVOTHROID) 25 MCG tablet Take 1 tablet (25 mcg) PO daily before breakfast for 7d, then take 2 tablets (50 mcg total) PO daily before breakfast for 7d. 21 tablet 0  . Multiple Vitamins-Minerals (MULTIVITAMIN & MINERAL PO) Take 1 tablet by mouth daily.      No results found for this or any previous visit (from the past 48 hour(s)).  No results found.   A comprehensive review of systems was negative.  Blood pressure 133/85, pulse 62, temperature 98.2 F (36.8 C), temperature source Oral, resp. rate 18, height  (1.753 m), weight 104.951 kg (231 lb 6 oz), SpO2 100 %.  General appearance: alert, cooperative and appears stated age Head: Normocephalic, without obvious abnormality, atraumatic Neck: supple, symmetrical, trachea midline Resp: clear to auscultation bilaterally Cardio: regular rate and rhythm GI: non-tender Extremities: decreased sensation in thumb, index, long and ring  fingers bilaterally.  intact capillary refill all digits.  +epl/fpl/io.  no wounds. Pulses: 2+ and symmetric Skin: Skin color, texture, turgor normal. No rashes or lesions Neurologic: Grossly normal Incision/Wound: none  Assessment/Plan Bilateral carpal tunnel syndrome.  Non operative and operative treatment options were discussed with the patient and patient wishes to proceed with operative treatment. Risks, benefits, and alternatives of surgery were discussed and the patient agrees with the plan of care.   Madilyn Cephas R 04/12/2015, 10:54 AM

## 2015-04-12 NOTE — Op Note (Signed)
Larry Thomas, Larry Thomas             ACCOUNT NO.:  [id number]  MEDICAL RECORD NO.:  [id number]  LOCATION:                                 FACILITY:  PHYSICIAN:  Betha Loa, MD        DATE OF BIRTH:  09-15-1975  DATE OF PROCEDURE:  04/12/2015 DATE OF DISCHARGE:                              OPERATIVE REPORT   PREOPERATIVE DIAGNOSIS:  Bilateral carpal tunnel syndrome.  POSTOPERATIVE DIAGNOSIS:  Bilateral carpal tunnel syndrome.  PROCEDURE:  Bilateral carpal tunnel release.  SURGEON:  Betha Loa, MD.  ASSISTANT:  None.  ANESTHESIA:  General IV.  FLUIDS:  Per anesthesia flow sheet.  ESTIMATED BLOOD LOSS:  Minimal.  COMPLICATIONS:  None.  SPECIMENS:  None.  TOURNIQUET TIME:  Right arm 36 minutes, left forearm 33 minutes.  DISPOSITION:  Stable to PACU.  INDICATIONS:  Mr. Larry Thomas is a 40 year old male who had numbness and tingling in bilateral thumb, index, long, and ring fingers.  He has nocturnal symptoms that wake him nightly.  He has positive nerve conduction studies.  He wishes to have a bilateral carpal tunnel release for management of symptoms.  Risks, benefits, and alternatives of surgery were discussed including risk of blood loss; infection; damage to nerves, vessels, tendons, ligaments, bone; failure of surgery; need for additional surgery; complications with wound healing, continued pain, recurrence of carpal tunnel, and damage to motor branch.  He voiced understanding of these risks and elected to proceed.  OPERATIVE COURSE:  After being identified preoperatively by myself, the patient and I agreed upon the procedure and site of procedure.  Surgical site was marked.  The risks, benefits, and alternatives of surgery were reviewed and wished to proceed.  Surgical consent had been signed.  He was transferred to the operating room and placed on the operating room table in supine position with upper extremity on arm board.  He was given IV antibiotics as  preoperative antibiotic prophylaxis.  General anesthesia was induced by anesthesiologist.  Right upper extremity was prepped and draped in normal sterile orthopedic fashion.  Surgical pause was performed between surgeons, anesthesia, operating room staff and all were in agreement as to the patient, procedure, and site of procedure. Tourniquet at the proximal aspect of the extremity was inflated to 250 mmHg after exsanguination of the limb with an Esmarch bandage.  Incision was made over the transverse carpal ligament.  Carried down to the subcutaneous tissues by spreading technique.  Bipolar electrocautery was used to obtain hemostasis.  The palmar fascia was sharply incised.  The transverse carpal ligament was identified and sharply incised.  It was incised distally first.  Care was taken to ensure complete decompression distally.  There was crossing of the hypothenar musculature across the midline.  This was freed up to ensure complete decompression.  The ligament was then incised proximally.  Scissors were used to split the distal aspect of the volar antebrachial fascia.  The finger was placed into the wound to ensure complete decompression which was the case.  The nerve was inspected.  It was flat and hyperemic.  The motor branch was identified, it was intact.  The wound was copiously irrigated with sterile saline.  It  was closed with a running subcuticular 5-0 Monocryl suture, which was augmented with Dermabond.  It was then dressed with sterile Xeroform, 4x4s, and ABD and wrapped with Kerlix and Ace bandage. Tourniquet was deflated at 36 minutes.  Fingertips were pink with brisk capillary refill after deflation of the tourniquet.  The left upper extremity was then prepped and draped in normal sterile orthopedic fashion.  Surgical pause was again performed between surgeons, anesthesia, and operating room staff, and all were in agreement as to the patient, procedure, and site of  procedure.  Tourniquet at the proximal aspect of the forearm was inflated to 250 mmHg after exsanguination of the limb with an Esmarch bandage.  Incision was made over the transverse carpal ligament and subcutaneous tissues by spreading technique.  The bipolar electrocautery was used to obtain hemostasis.  The forearm fascia sharply incised.  The transverse carpal ligament was then incised.  Incised distally first.  Care was taken to ensure complete decompression distally.  The hypothenar musculature again crossed across the midline.  This was carefully released. Complete decompression was obtained distally.  The ligament was then incised proximally.  Scissors were used to split the distal aspect of the volar antebrachial fascia.  A finger was placed into the wound to ensure complete decompression which was the case.  The nerve was inspected.  It was flat and hyperemic, and adherent to the radial leaflet.  The motor branch was identified and was intact.  The wound was copiously irrigated with sterile saline.  It was then closed with 5-0 Monocryl suture in a running subcuticular fashion.  This was augmented with the Dermabond.  It was then dressed with Xeroform, 4x4s, and ABD and wrapped with Kerlix and Ace bandage.  Tourniquet deflated at 33 minutes.  Fingertips were pink with brisk capillary refill after deflation of the tourniquet.  Both wounds had been injected with 10 mL of 0.25% plain Marcaine to aid in postoperative analgesia.  The patient was awoken from anesthesia safely.  Operative drapes were broken down. He was transferred back to stretcher and taken to PACU in stable condition.  I will see him back in the office in 1 week for postoperative followup.  Percocet 5/325, 1-2 p.o. q.6 hours p.r.n. pain, dispensed #30.     Betha Loa, MD     KK/MEDQ  D:  04/12/2015  T:  04/12/2015  Job:  [id number]

## 2015-04-12 NOTE — Discharge Instructions (Addendum)
[facility] Instructions °Hand Surgery ° °Wound Care: °Keep your hand elevated above the level of your heart.  Do not allow it to dangle by your side.  Keep the dressing dry and do not remove it unless your doctor advises you to do so.  He will usually change it at the time of your post-op visit.  Moving your fingers is advised to stimulate circulation but will depend on the site of your surgery.  If you have a splint applied, your doctor will advise you regarding movement. ° °Activity: °Do not drive or operate machinery today.  Rest today and then you may return to your normal activity and work as indicated by your physician. ° °Diet:  °Drink liquids today or eat a light diet.  You may resume a regular diet tomorrow.   ° °General expectations: °Pain for two to three days. °Fingers may become slightly swollen. ° °Call your doctor if any of the following occur: °Severe pain not relieved by pain medication. °Elevated temperature. °Dressing soaked with blood. °Inability to move fingers. °White or bluish color to fingers. ° °Postoperative Anesthesia Instructions-Pediatric ° °Activity: °Your child should rest for the remainder of the day. A responsible adult should stay with your child for 24 hours. ° °Meals: °Your child should start with liquids and light foods such as gelatin or soup unless otherwise instructed by the physician. Progress to regular foods as tolerated. Avoid spicy, greasy, and heavy foods. If nausea and/or vomiting occur, drink only clear liquids such as apple juice or Pedialyte until the nausea and/or vomiting subsides. Call your physician if vomiting continues. ° °Special Instructions/Symptoms: °Your child may be drowsy for the rest of the day, although some children experience some hyperactivity a few hours after the surgery. Your child may also experience some irritability or crying episodes due to the operative procedure and/or anesthesia. Your child's throat may feel dry or sore from the  anesthesia or the breathing tube placed in the throat during surgery. Use throat lozenges, sprays, or ice chips if needed.  °

## 2015-04-12 NOTE — Anesthesia Preprocedure Evaluation (Addendum)
Anesthesia Evaluation  Patient identified by MRN, date of birth, ID band Patient awake    Reviewed: Allergy & Precautions, NPO status , Patient's Chart, lab work & pertinent test results  Airway Mallampati: II  TM Distance: >3 FB Neck ROM: Full    Dental no notable dental hx.    Pulmonary neg pulmonary ROS, former smoker,    Pulmonary exam normal breath sounds clear to auscultation       Cardiovascular negative cardio ROS Normal cardiovascular exam Rhythm:Regular Rate:Normal     Neuro/Psych negative neurological ROS  negative psych ROS   GI/Hepatic negative GI ROS, Neg liver ROS,   Endo/Other  negative endocrine ROS  Renal/GU negative Renal ROS  negative genitourinary   Musculoskeletal negative musculoskeletal ROS (+)   Abdominal   Peds negative pediatric ROS (+)  Hematology negative hematology ROS (+)   Anesthesia Other Findings   Reproductive/Obstetrics negative OB ROS                            Anesthesia Physical Anesthesia Plan  ASA: II  Anesthesia Plan: General   Post-op Pain Management:    Induction: Intravenous  Airway Management Planned: LMA  Additional Equipment:   Intra-op Plan:   Post-operative Plan: Extubation in OR  Informed Consent: I have reviewed the patients History and Physical, chart, labs and discussed the procedure including the risks, benefits and alternatives for the proposed anesthesia with the patient or authorized representative who has indicated his/her understanding and acceptance.   Dental advisory given  Plan Discussed with: CRNA  Anesthesia Plan Comments:         Anesthesia Quick Evaluation  

## 2015-04-12 NOTE — Anesthesia Postprocedure Evaluation (Signed)
Anesthesia Post Note  Patient: Larry Thomas  Procedure(s) Performed: Procedure(s) (LRB): BILATERAL CARPAL TUNNEL RELEASE (Bilateral)  Patient location during evaluation: PACU Anesthesia Type: General Level of consciousness: awake and alert Pain management: pain level controlled Vital Signs Assessment: post-procedure vital signs reviewed and stable Respiratory status: spontaneous breathing, nonlabored ventilation, respiratory function stable and patient connected to nasal cannula oxygen Cardiovascular status: blood pressure returned to baseline and stable Postop Assessment: no signs of nausea or vomiting Anesthetic complications: no    Last Vitals:  Filed Vitals:   04/12/15 1345 04/12/15 1427  BP: 144/89 161/96  Pulse: 68 64  Temp:  36.4 C  Resp: 14 16    Last Pain:  Filed Vitals:   04/12/15 1429  PainSc: 4                  Phillips Grout

## 2015-04-13 ENCOUNTER — Encounter (HOSPITAL_BASED_OUTPATIENT_CLINIC_OR_DEPARTMENT_OTHER): Payer: Self-pay | Admitting: Orthopedic Surgery

## 2015-07-22 ENCOUNTER — Emergency Department (HOSPITAL_COMMUNITY)
Admission: EM | Admit: 2015-07-22 | Discharge: 2015-07-22 | Disposition: A | Discharge status: Home / Self Care | Payer: Medicaid Other | Attending: Emergency Medicine | Admitting: Emergency Medicine

## 2015-07-22 ENCOUNTER — Encounter (HOSPITAL_COMMUNITY): Payer: Self-pay | Admitting: Emergency Medicine

## 2015-07-22 ENCOUNTER — Emergency Department (HOSPITAL_COMMUNITY): Payer: Medicaid Other

## 2015-07-22 DIAGNOSIS — E7800 Pure hypercholesterolemia, unspecified: Secondary | ICD-10-CM | POA: Diagnosis not present

## 2015-07-22 DIAGNOSIS — G8929 Other chronic pain: Secondary | ICD-10-CM | POA: Diagnosis not present

## 2015-07-22 DIAGNOSIS — R634 Abnormal weight loss: Secondary | ICD-10-CM | POA: Diagnosis not present

## 2015-07-22 DIAGNOSIS — R0789 Other chest pain: Secondary | ICD-10-CM | POA: Insufficient documentation

## 2015-07-22 DIAGNOSIS — M7918 Myalgia, other site: Secondary | ICD-10-CM

## 2015-07-22 DIAGNOSIS — R002 Palpitations: Secondary | ICD-10-CM | POA: Diagnosis present

## 2015-07-22 DIAGNOSIS — Z79899 Other long term (current) drug therapy: Secondary | ICD-10-CM | POA: Diagnosis not present

## 2015-07-22 DIAGNOSIS — E039 Hypothyroidism, unspecified: Secondary | ICD-10-CM | POA: Diagnosis not present

## 2015-07-22 DIAGNOSIS — Z791 Long term (current) use of non-steroidal anti-inflammatories (NSAID): Secondary | ICD-10-CM | POA: Diagnosis not present

## 2015-07-22 DIAGNOSIS — Z87891 Personal history of nicotine dependence: Secondary | ICD-10-CM | POA: Insufficient documentation

## 2015-07-22 DIAGNOSIS — Z79891 Long term (current) use of opiate analgesic: Secondary | ICD-10-CM | POA: Diagnosis not present

## 2015-07-22 DIAGNOSIS — M549 Dorsalgia, unspecified: Secondary | ICD-10-CM | POA: Diagnosis not present

## 2015-07-22 LAB — URINALYSIS, ROUTINE W REFLEX MICROSCOPIC
Bilirubin Urine: NEGATIVE
GLUCOSE, UA: NEGATIVE mg/dL
Hgb urine dipstick: NEGATIVE
Ketones, ur: NEGATIVE mg/dL
LEUKOCYTES UA: NEGATIVE
NITRITE: NEGATIVE
PH: 6 (ref 5.0–8.0)
Protein, ur: NEGATIVE mg/dL
SPECIFIC GRAVITY, URINE: 1.039 — AB (ref 1.005–1.030)

## 2015-07-22 LAB — BASIC METABOLIC PANEL
ANION GAP: 8 (ref 5–15)
BUN: 23 mg/dL — AB (ref 6–20)
CALCIUM: 9.2 mg/dL (ref 8.9–10.3)
CO2: 24 mmol/L (ref 22–32)
Chloride: 107 mmol/L (ref 101–111)
Creatinine, Ser: 0.91 mg/dL (ref 0.61–1.24)
GFR calc Af Amer: >60 mL/min (ref >60)
Glucose, Bld: 103 mg/dL — ABNORMAL HIGH (ref 65–99)
POTASSIUM: 3.4 mmol/L — AB (ref 3.5–5.1)
SODIUM: 139 mmol/L (ref 135–145)

## 2015-07-22 LAB — I-STAT TROPONIN, ED
TROPONIN I, POC: 0 ng/mL (ref 0.00–0.08)
Troponin i, poc: 0 ng/mL (ref 0.00–0.08)

## 2015-07-22 LAB — CBC
HEMATOCRIT: 40.1 % (ref 39.0–52.0)
HEMOGLOBIN: 13.9 g/dL (ref 13.0–17.0)
MCH: 29.4 pg (ref 26.0–34.0)
MCHC: 34.7 g/dL (ref 30.0–36.0)
MCV: 84.8 fL (ref 78.0–100.0)
Platelets: 240 10*3/uL (ref 150–400)
RBC: 4.73 MIL/uL (ref 4.22–5.81)
RDW: 13 % (ref 11.5–15.5)
WBC: 6.4 10*3/uL (ref 4.0–10.5)

## 2015-07-22 LAB — TSH: TSH: 1.807 u[IU]/mL (ref 0.350–4.500)

## 2015-07-22 MED ORDER — HYDROCODONE-ACETAMINOPHEN 5-325 MG PO TABS
1.0000 | ORAL_TABLET | Freq: Once | ORAL | Status: AC
  Administered 2015-07-22: 1 via ORAL
  Filled 2015-07-22: qty 1

## 2015-07-22 MED ORDER — HYDROCODONE-ACETAMINOPHEN 5-325 MG PO TABS: ORAL_TABLET | ORAL | Status: DC

## 2015-07-22 MED ORDER — ASPIRIN 81 MG PO CHEW: 81.0000 mg | CHEWABLE_TABLET | Freq: Every day | ORAL | Status: DC

## 2015-07-22 MED ORDER — ASPIRIN 81 MG PO CHEW
324.0000 mg | CHEWABLE_TABLET | Freq: Once | ORAL | Status: AC
  Administered 2015-07-22: 324 mg via ORAL
  Filled 2015-07-22: qty 4

## 2015-07-22 NOTE — ED Notes (Signed)
PA at bedside.

## 2015-07-22 NOTE — ED Notes (Signed)
Pa  at bedside. 

## 2015-07-22 NOTE — ED Provider Notes (Signed)
CSN: [id number]     Arrival date & time 07/22/15  0915 History   First MD Initiated Contact with Patient 07/22/15 1025     Chief Complaint  Patient presents with  . Palpitations  . Back Pain     (Consider location/radiation/quality/duration/timing/severity/associated sxs/prior Treatment) HPI   Blood pressure 131/90, pulse 62, temperature 97.9 F (36.6 C), temperature source Oral, resp. rate 16, SpO2 100 %.  Larry Thomas is a 40 y.o. male with past medical history significant hypothyroid, hyperlipidemia, former smoker complaining of pain from his cervical spine down to the lumbar spine, worse in the cervical spine onset several months ago significantly worsening over the last week and associated radiation of the pain down the right arm and into the right armpit. Patient denies numbness, weakness above his baseline, states he has history of carpal tunnel and has some numbness in the right fingers but that's normal for him at his baseline. Patient also reports a left upper quadrant abdominal pain with associated diarrhea that is non-melanotic with no gross blood. The symptoms started 2 days ago. He denies fever, chills, cough, history of DVT or PE, recent mobilizations, calf pain, leg swelling, diabetes, hypertension, cocaine or methamphetamine use. He reports a 30 pound weight loss over the last months. He reports difficulties going to sleep but denies easy bruising, bleeding, night sweats. He has intermittent shortness of breath and palpitations but denies dyspnea on exertion, increasing peripheral edema, orthopnea, PND. It was last checked one month ago and endocrinologist has had difficulty maintaining good levels.  Will see new primary care physician in the next month at [facility] with Osei-Bonsu   Past Medical History  Diagnosis Date  . Hypercholesterolemia   . Thyroid disease   . Hypothyroidism    Past Surgical History  Procedure Laterality Date  . Bilateral carpal tunnel release  Bilateral 04/12/2015    Procedure: BILATERAL CARPAL TUNNEL RELEASE;  Surgeon: Betha Loa, MD;  Location: [facility];  Service: Orthopedics;  Laterality: Bilateral;   History reviewed. No pertinent family history. Social History  Substance Use Topics  . Smoking status: Former Games developer  . Smokeless tobacco: None  . Alcohol Use: No    Review of Systems  10 systems reviewed and found to be negative, except as noted in the HPI.  Allergies  Review of patient's allergies indicates no known allergies.  Home Medications   Prior to Admission medications   Medication Sig Start Date End Date Taking? Authorizing Provider  levothyroxine (SYNTHROID, LEVOTHROID) 200 MCG tablet Take 200 mcg by mouth daily at 6 (six) AM. 06/12/15  Yes Historical Provider, MD  Multiple Vitamins-Minerals (MULTIVITAMIN & MINERAL PO) Take 1 tablet by mouth daily.   Yes Historical Provider, MD  aspirin 81 MG chewable tablet Chew 1 tablet (81 mg total) by mouth daily. 07/22/15   Rayel Santizo, PA-C  HYDROcodone-acetaminophen (NORCO/VICODIN) 5-325 MG tablet Take 1-2 tablets by mouth every 6 hours as needed for pain and/or cough. 07/22/15   Lamount Bankson, PA-C  naproxen (NAPROSYN) 500 MG tablet Take 500 mg by mouth 2 (two) times daily as needed. pain 07/19/15   Historical Provider, MD  oxyCODONE-acetaminophen (PERCOCET) 5-325 MG tablet 1-2 tabs po q6 hours prn pain Patient not taking: Reported on 07/22/2015 04/12/15   Betha Loa, MD  tiZANidine (ZANAFLEX) 4 MG tablet Take 8 mg by mouth every 8 (eight) hours as needed. pain 07/19/15   Historical Provider, MD   BP 134/94 mmHg  Pulse 50  Temp(Src) 97.9 F (  36.6 C) (Oral)  Resp 18  SpO2 100% Physical Exam  Constitutional: He is oriented to person, place, and time. He appears well-developed and well-nourished. No distress.  HENT:  Head: Normocephalic.  Mouth/Throat: Oropharynx is clear and moist.  Eyes: Conjunctivae are normal.  Neck: Normal range of motion.  No JVD present. No tracheal deviation present.  No midline C-spine  tenderness to palpation or step-offs appreciated. Patient has full range of motion without pain.  Grip strength, biceps, triceps 5/5 bilaterally;  can differentiate between pinprick and light touch bilaterally.  Spurling negative bilaterally  Cardiovascular: Normal rate, regular rhythm and intact distal pulses.   Radial pulse equal bilaterally  Pulmonary/Chest: Effort normal and breath sounds normal. No stridor. No respiratory distress. He has no wheezes. He has no rales. He exhibits no tenderness.  Abdominal: Soft. He exhibits no distension and no mass. There is no tenderness. There is no rebound and no guarding.  Musculoskeletal: Normal range of motion. He exhibits no edema or tenderness.  No calf asymmetry, superficial collaterals, palpable cords, edema, Homans sign negative bilaterally.    Neurological: He is alert and oriented to person, place, and time.  Skin: Skin is warm. He is not diaphoretic.  Psychiatric: He has a normal mood and affect.  Nursing note and vitals reviewed.   ED Course  Procedures (including critical care time) Labs Review Labs Reviewed  BASIC METABOLIC PANEL - Abnormal; Notable for the following:    Potassium 3.4 (*)    Glucose, Bld 103 (*)    BUN 23 (*)    All other components within normal limits  URINALYSIS, ROUTINE W REFLEX MICROSCOPIC (NOT AT [facility]) - Abnormal; Notable for the following:    Specific Gravity, Urine 1.039 (*)    All other components within normal limits  CBC  TSH  I-STAT TROPOININ, ED  I-STAT TROPOININ, ED    Imaging Review Dg Chest 2 View  07/22/2015  CLINICAL DATA:  Chest pain and short of breath for 1 day EXAM: CHEST  2 VIEW COMPARISON:  03/02/2015 FINDINGS: Normal heart size. Lungs clear. No pneumothorax. No pleural effusion. IMPRESSION: No active cardiopulmonary disease. Electronically Signed   By: Jolaine Click M.D.   On: 07/22/2015 11:00   I have personally  reviewed and evaluated these images and lab results as part of my medical decision-making.   EKG Interpretation   Date/Time:  Sunday July 22 2015 09:42:02 EDT Ventricular Rate:  57 PR Interval:  159 QRS Duration: 87 QT Interval:  403 QTC Calculation: 392 R Axis:   49 Text Interpretation:  Sinus rhythm Atrial premature complex Probable left  atrial enlargement Probable anteroseptal infarct, old agree. noSTEMI, no  sig change from old Confirmed by Donnald Garre, MD, Lebron Conners [phone]) on 07/22/2015  10:26:43 AM      MDM   Final diagnoses:  Chronic musculoskeletal pain  Atypical chest pain  Palpitations  Weight loss    Filed Vitals:   07/22/15 0937 07/22/15 1148  BP: 131/90 134/94  Pulse: 62 50  Temp: 97.9 F (36.6 C)   TempSrc: Oral   Resp: 16 18  SpO2: 100% 100%    Medications  aspirin chewable tablet 324 mg (324 mg Oral Given 07/22/15 1100)  HYDROcodone-acetaminophen (NORCO/VICODIN) 5-325 MG per tablet 1 tablet (1 tablet Oral Given 07/22/15 1100)    Larry Thomas is 40 y.o. male presenting with Multiple complaints including chronic in from C-spine to L spine, radiating down to the right arm and right axilla, no associated weakness.  Patient is low risk by Wells criteria and PERC negative. HEART score low.   EKG with PA-C, unchanged from prior, no arrhythmia or ischemic changes. Troponin negative, TSH normal. Chest x-ray without abnormality. Physical exam with no signs of impingement of the C-spine. Patient is improved with Vicodin, recommend orthopedic referral after we check delta troponin.  Delta troponin negative, advised patient to take low-dose daily aspirin and patient given cardiology referral in addition to orthopedic referral.   Evaluation does not show pathology that would require ongoing emergent intervention or inpatient treatment. Pt is hemodynamically stable and mentating appropriately. Discussed findings and plan with patient/guardian, who agrees with care  plan. All questions answered. Return precautions discussed and outpatient follow up given.   New Prescriptions   ASPIRIN 81 MG CHEWABLE TABLET    Chew 1 tablet (81 mg total) by mouth daily.   HYDROCODONE-ACETAMINOPHEN (NORCO/VICODIN) 5-325 MG TABLET    Take 1-2 tablets by mouth every 6 hours as needed for pain and/or cough.    I personally performed the services described in this documentation, which was scribed in my presence.  The recorded information has been reviewed and is accurate.      Wynetta Emeryicole Kalima Saylor, PA-C 07/22/15 1324  Linwood DibblesJon Knapp, MD 07/22/15 [phone]

## 2015-07-22 NOTE — ED Notes (Signed)
Pt reports generalized weakness, palpitations, entire spine pain, R armpit pain and LUQ intermittently. Back pain has been continuous for the past year. Intermittent palpitations since Friday.

## 2015-07-22 NOTE — Discharge Instructions (Signed)
Please follow with your primary care doctor in the next 2 days for a check-up. They must obtain records for further management.  ° °Do not hesitate to return to the Emergency Department for any new, worsening or concerning symptoms.  ° °

## 2015-08-30 ENCOUNTER — Ambulatory Visit: Payer: Medicaid Other | Attending: Orthopedic Surgery | Admitting: Physical Therapy

## 2015-08-30 ENCOUNTER — Encounter: Payer: Self-pay | Admitting: Physical Therapy

## 2015-08-30 DIAGNOSIS — M545 Low back pain, unspecified: Secondary | ICD-10-CM

## 2015-08-30 DIAGNOSIS — M542 Cervicalgia: Secondary | ICD-10-CM | POA: Insufficient documentation

## 2015-08-30 DIAGNOSIS — R252 Cramp and spasm: Secondary | ICD-10-CM | POA: Diagnosis present

## 2015-08-30 NOTE — Therapy (Signed)
[facility] [address] [place], [place], [address] Phone: [phone]   Fax:  [phone]  Physical Therapy Evaluation  Patient Details  Name: Larry Thomas MRN: [id number] Date of Birth: Jul 28, 1975 Referring Provider: Yevette Edwards  Encounter Date: 08/30/2015      PT End of Session - 08/30/15 0955    Visit Number 1   Date for PT Re-Evaluation 10/30/15   Authorization Type medicaid   PT Start Time 0930   PT Stop Time 1020   PT Time Calculation (min) 50 min   Activity Tolerance Patient tolerated treatment well   Behavior During Therapy [facility] for tasks assessed/performed      Past Medical History  Diagnosis Date  . Hypercholesterolemia   . Thyroid disease   . Hypothyroidism     Past Surgical History  Procedure Laterality Date  . Bilateral carpal tunnel release Bilateral 04/12/2015    Procedure: BILATERAL CARPAL TUNNEL RELEASE;  Surgeon: Betha Loa, MD;  Location: [facility];  Service: Orthopedics;  Laterality: Bilateral;    There were no vitals filed for this visit.       Subjective Assessment - 08/30/15 0930    Subjective Patient reports that he has back and neck pain beginning in January 2017, he reports no specific cause.  Reports that he has had x-rays and MRI both negative   Patient Stated Goals sleep better, have less pain   Currently in Pain? Yes   Pain Score 5    Pain Location Neck  and lower back   Pain Descriptors / Indicators Aching;Tightness   Pain Type Chronic pain   Pain Onset More than a month ago   Pain Frequency Constant   Aggravating Factors  reports at the end of the day at work, lifting and standing all day he will have pain up to 8-9/10   Pain Relieving Factors some stretches, mm relaxer and pain med pain down to a 3/10   Effect of Pain on Daily Activities difficulty sleeping and lifting            [facility] PT Assessment - 08/30/15 0001    Assessment   Medical  Diagnosis neck and back pain   Referring Provider Dumonski   Onset Date/Surgical Date 07/30/15   Prior Therapy n   Precautions   Precautions None   Balance Screen   Has the patient fallen in the past 6 months No   Has the patient had a decrease in activity level because of a fear of falling?  No   Is the patient reluctant to leave their home because of a fear of falling?  No   Home Environment   Additional Comments has an 95 and 41 year old that he lifts at times, used to be able to do his yardwork   Prior Function   Level of Independence Independent   Vocation Full time employment   Nutritional therapist, lifting and standing   Leisure limited exercise   Posture/Postural Control   Posture Comments fwd head, rounded shoulders   ROM / Strength   AROM / PROM / Strength AROM;Strength   AROM   Overall AROM Comments cervical ROM was decreased 50% for all motions except flexion was WNL's, Lumbar ROM was WNL's for flexion, decreased 50% for extension and 25% for side bending   Strength   Overall Strength Comments 4+/5 for shoulders, Legs WNL's   Flexibility   Soft Tissue Assessment /Muscle Length --  piriformis  mms tight, some neural tension in the arms   Palpation   Palpation comment he is tight and tender in the cervical p-spinals, the upper traps, the rhomboids and into the lumbar area                   [facility] - 08/30/15 0001    Modalities   Modalities Electrical Stimulation;Moist Heat   Moist Heat Therapy   Number Minutes Moist Heat 15 Minutes   Moist Heat Location Lumbar Spine;Cervical   Electrical Stimulation   Electrical Stimulation Location C/T area   Electrical Stimulation Action IFC   Electrical Stimulation Parameters supine   Electrical Stimulation Goals Pain                PT Education - 08/30/15 0954    Education provided Yes   Education Details issued scapular retraction, cervical retraction, shoulder shrugs and  piriformis stretches, went over how to return to gym and gave tband   Person(s) Educated Patient   Methods Explanation;Demonstration;Handout   Comprehension Verbalized understanding          PT Short Term Goals - 08/30/15 1000    PT SHORT TERM GOAL #1   Title independent with initial HEP   Time 2   Period Weeks   Status New           PT Long Term Goals - 08/30/15 1001    PT LONG TERM GOAL #1   Title understand proper posture and body mechanics   Time 8   Period Weeks   Status New   PT LONG TERM GOAL #2   Title increase cervical ROM 25%   Time 8   Period Weeks   Status New   PT LONG TERM GOAL #3   Title decrease pain 50%   Time 8   Period Weeks   Status New   PT LONG TERM GOAL #4   Title report that he is able to get back to doing some of his yardwork without increase of pain   Time 8   Period Weeks   Status New               Plan - 08/30/15 [id number]    Clinical Impression Statement Patient with neck and back pain.  He is unsure of a cause.  He has spasms in the upper traps and into the rhomboids.  He has neural tension in the UE's but has a history of bilateral carpal tunnel surgery in January   Rehab Potential Good   PT Frequency One time visit   PT Duration --   PT Treatment/Interventions ADLs/Self Care Home Management;Cryotherapy;Electrical Stimulation;Iontophoresis 4mg /ml Dexamethasone;Moist Heat;Therapeutic exercise;Therapeutic activities;Ultrasound;Traction;Neuromuscular re-education;Patient/family education;Manual techniques;Passive range of motion   PT Next Visit Plan medicaid only allows one visit   Consulted and Agree with Plan of Care Patient      Patient will benefit from skilled therapeutic intervention in order to improve the following deficits and impairments:  Decreased activity tolerance, Decreased range of motion, Decreased strength, Increased muscle spasms, Increased fascial restricitons, Impaired flexibility, Postural dysfunction,  Improper body mechanics, Pain  Visit Diagnosis: Cervicalgia - Plan: PT plan of care cert/re-cert  Cramp and spasm - Plan: PT plan of care cert/re-cert  Bilateral low back pain without sciatica - Plan: PT plan of care cert/re-cert     Problem List There are no active problems to display for this patient.   Jearld LeschALBRIGHT,Quillan W., PT 08/30/2015, 10:16 AM  [facility] [address] [place], [place], [address] Phone: [phone]   Fax:  [phone]  Name: Larry Thomas MRN: [id number] Date of Birth: 07-20-1975

## 2016-04-01 ENCOUNTER — Encounter: Payer: Self-pay | Admitting: Sports Medicine

## 2016-04-01 ENCOUNTER — Ambulatory Visit (INDEPENDENT_AMBULATORY_CARE_PROVIDER_SITE_OTHER): Payer: Medicaid Other | Admitting: Sports Medicine

## 2016-04-01 DIAGNOSIS — B359 Dermatophytosis, unspecified: Secondary | ICD-10-CM

## 2016-04-01 DIAGNOSIS — M79676 Pain in unspecified toe(s): Secondary | ICD-10-CM

## 2016-04-01 DIAGNOSIS — M79674 Pain in right toe(s): Secondary | ICD-10-CM

## 2016-04-01 DIAGNOSIS — B351 Tinea unguium: Secondary | ICD-10-CM | POA: Diagnosis not present

## 2016-04-01 DIAGNOSIS — L853 Xerosis cutis: Secondary | ICD-10-CM

## 2016-04-01 DIAGNOSIS — M79675 Pain in left toe(s): Secondary | ICD-10-CM

## 2016-04-01 MED ORDER — CLOTRIMAZOLE 1 % EX SOLN: 1.0000 "application " | Freq: Two times a day (BID) | CUTANEOUS | 5 refills | Status: DC

## 2016-04-01 NOTE — Progress Notes (Signed)
  Subjective: Larry Thomas is a 41 y.o. male patient seen today in office with complaint of painful thickened and discolored nails and callus/dry skin. Patient is desiring treatment for nail changes and dry skin; has tried OTC topicals with no improvement. Reports that nails are becoming difficult to manage because of the thickness. Patient has no other pedal complaints at this time.   There are no active problems to display for this patient.   Current Outpatient Prescriptions on File Prior to Visit  Medication Sig Dispense Refill  . aspirin 81 MG chewable tablet Chew 1 tablet (81 mg total) by mouth daily. 60 tablet 1  . HYDROcodone-acetaminophen (NORCO/VICODIN) 5-325 MG tablet Take 1-2 tablets by mouth every 6 hours as needed for pain and/or cough. 7 tablet 0  . levothyroxine (SYNTHROID, LEVOTHROID) 200 MCG tablet Take 200 mcg by mouth daily at 6 (six) AM.    . Multiple Vitamins-Minerals (MULTIVITAMIN & MINERAL PO) Take 1 tablet by mouth daily.    . naproxen (NAPROSYN) 500 MG tablet Take 500 mg by mouth 2 (two) times daily as needed. pain  0  . oxyCODONE-acetaminophen (PERCOCET) 5-325 MG tablet 1-2 tabs po q6 hours prn pain (Patient not taking: Reported on 07/22/2015) 30 tablet 0  . tiZANidine (ZANAFLEX) 4 MG tablet Take 8 mg by mouth every 8 (eight) hours as needed. pain  0   No current facility-administered medications on file prior to visit.     No Known Allergies  Objective: Physical Exam  General: Well developed, nourished, no acute distress, awake, alert and oriented x 3  Vascular: Dorsalis pedis artery 2/4 bilateral, Posterior tibial artery 2/4 bilateral, skin temperature warm to warm proximal to distal bilateral lower extremities, no varicosities, pedal hair present bilateral.  Neurological: Gross sensation present via light touch bilateral.   Dermatological: Skin is warm, dry, and supple bilateral, Nails 1-10 are tender, short thick, and discolored with mild subungal  debris, + webspace macerations present bilateral, no open lesions present bilateral, dry skin with minimal callus/hyperkeratotic tissue present bilateral. No signs of infection bilateral.  Musculoskeletal: No symptomatic boney deformities noted bilateral. Muscular strength within normal limits without painon range of motion. No pain with calf compression bilateral.  Assessment and Plan:  Problem List Items Addressed This Visit    None    Visit Diagnoses    Tinea    -  Primary   Relevant Medications   clotrimazole (LOTRIMIN) 1 % external solution   Other Relevant Orders   Fungus Culture with Smear   Dermatophytosis of nail       Relevant Medications   clotrimazole (LOTRIMIN) 1 % external solution   Other Relevant Orders   Fungus Culture with Smear   Dry skin       Toe pain, bilateral          -Examined patient -Discussed treatment options for painful dystrophic nails and tinea  -Fungal culture was obtained and sent to [facility] -Rx Clotrimazole for interdigital tinea  -Recommend daily skin emollients for dry skin  -Patient to return in 4 weeks for follow up evaluation and discussion of fungal culture results or sooner if symptoms worsen.  Asencion Islamitorya Rajni Holsworth, DPM

## 2016-04-07 ENCOUNTER — Other Ambulatory Visit: Payer: Self-pay

## 2016-04-07 NOTE — Addendum Note (Signed)
Addended by: Marylou MccoyQUINTANA, Oniel Meleski L on: 04/07/2016 09:28 AM   Modules accepted: Orders

## 2016-04-07 NOTE — Progress Notes (Signed)
error 

## 2016-04-10 LAB — FUNGAL STAIN

## 2016-04-29 ENCOUNTER — Ambulatory Visit (INDEPENDENT_AMBULATORY_CARE_PROVIDER_SITE_OTHER): Payer: Medicaid Other | Admitting: Sports Medicine

## 2016-04-29 ENCOUNTER — Encounter: Payer: Self-pay | Admitting: Sports Medicine

## 2016-04-29 DIAGNOSIS — B351 Tinea unguium: Secondary | ICD-10-CM

## 2016-04-29 DIAGNOSIS — M79674 Pain in right toe(s): Secondary | ICD-10-CM

## 2016-04-29 DIAGNOSIS — M79675 Pain in left toe(s): Secondary | ICD-10-CM | POA: Diagnosis not present

## 2016-04-29 DIAGNOSIS — B359 Dermatophytosis, unspecified: Secondary | ICD-10-CM | POA: Diagnosis not present

## 2016-04-29 DIAGNOSIS — L853 Xerosis cutis: Secondary | ICD-10-CM

## 2016-04-29 LAB — HEPATIC FUNCTION PANEL
ALT: 30 U/L (ref 9–46)
AST: 25 U/L (ref 10–40)
Albumin: 4.9 g/dL (ref 3.6–5.1)
Alkaline Phosphatase: 48 U/L (ref 40–115)
Bilirubin, Direct: 0.1 mg/dL (ref <=0.2)
Indirect Bilirubin: 0.2 mg/dL (ref 0.2–1.2)
TOTAL PROTEIN: 7.6 g/dL (ref 6.1–8.1)
Total Bilirubin: 0.3 mg/dL (ref 0.2–1.2)

## 2016-04-29 MED ORDER — TERBINAFINE HCL 1 % EX CREA: 1.0000 "application " | TOPICAL_CREAM | Freq: Two times a day (BID) | CUTANEOUS | 4 refills | Status: DC

## 2016-04-29 NOTE — Progress Notes (Signed)
  Subjective: Larry Thomas is a 41 y.o. male patient seen today in office for fungal culture results. Patient has no other pedal complaints at this time.   There are no active problems to display for this patient.   Current Outpatient Prescriptions on File Prior to Visit  Medication Sig Dispense Refill  . aspirin 81 MG chewable tablet Chew 1 tablet (81 mg total) by mouth daily. 60 tablet 1  . clotrimazole (LOTRIMIN) 1 % external solution Apply 1 application topically 2 (two) times daily. In between toes 60 mL 5  . HYDROcodone-acetaminophen (NORCO/VICODIN) 5-325 MG tablet Take 1-2 tablets by mouth every 6 hours as needed for pain and/or cough. 7 tablet 0  . levothyroxine (SYNTHROID, LEVOTHROID) 200 MCG tablet Take 200 mcg by mouth daily at 6 (six) AM.    . Multiple Vitamins-Minerals (MULTIVITAMIN & MINERAL PO) Take 1 tablet by mouth daily.    . naproxen (NAPROSYN) 500 MG tablet Take 500 mg by mouth 2 (two) times daily as needed. pain  0  . oxyCODONE-acetaminophen (PERCOCET) 5-325 MG tablet 1-2 tabs po q6 hours prn pain (Patient not taking: Reported on 07/22/2015) 30 tablet 0  . tiZANidine (ZANAFLEX) 4 MG tablet Take 8 mg by mouth every 8 (eight) hours as needed. pain  0   No current facility-administered medications on file prior to visit.     No Known Allergies  Objective: Physical Exam  General: Well developed, nourished, no acute distress, awake, alert and oriented x 3  Vascular: Dorsalis pedis artery 2/4 bilateral, Posterior tibial artery 2/4 bilateral, skin temperature warm to warm proximal to distal bilateral lower extremities, no varicosities, pedal hair present bilateral.  Neurological: Gross sensation present via light touch bilateral.   Dermatological: Skin is warm, dry, and supple bilateral, Nails 1-10 are tender, short thick, and discolored with mild subungal debris, improved webspace macerations present bilateral, no open lesions present bilateral, no  callus/corns/hyperkeratotic tissue present bilateral however diffuse dry skin. No signs of infection bilateral.  Musculoskeletal: No boney deformities noted bilateral. Muscular strength within normal limits without painon range of motion. No pain with calf compression bilateral.  Fungal culture + Tinea   Assessment and Plan:  Problem List Items Addressed This Visit    None    Visit Diagnoses    Tinea    -  Primary   Relevant Medications   terbinafine (LAMISIL AT) 1 % cream   Other Relevant Orders   Hepatic Function Panel   Dermatophytosis of nail       Relevant Medications   terbinafine (LAMISIL AT) 1 % cream   Other Relevant Orders   Hepatic Function Panel   Dry skin       Toe pain, bilateral          -Examined patient -Discussed treatment options for painful mycotic nails and dry skin possible tinea  -Patient opt for oral Lamisil with full understanding of medication risks; ordered LFTs for review if within normal limits will proceed with sending Rx to pharmacy for lamisil 250mg  PO daily. Anticipate 12 week course.  -Patient use lamisil cream daily and to finish clotrimazole for in between toes -Advised good hygiene habits -Patient to return in 6 weeks for follow up evaluation or sooner if symptoms worsen.  Asencion Islamitorya Messiah Rovira, DPM

## 2016-06-10 ENCOUNTER — Encounter: Payer: Self-pay | Admitting: Sports Medicine

## 2016-06-10 ENCOUNTER — Ambulatory Visit (INDEPENDENT_AMBULATORY_CARE_PROVIDER_SITE_OTHER): Payer: Medicaid Other | Admitting: Sports Medicine

## 2016-06-10 DIAGNOSIS — B359 Dermatophytosis, unspecified: Secondary | ICD-10-CM | POA: Diagnosis not present

## 2016-06-10 DIAGNOSIS — L853 Xerosis cutis: Secondary | ICD-10-CM

## 2016-06-10 DIAGNOSIS — B351 Tinea unguium: Secondary | ICD-10-CM | POA: Diagnosis not present

## 2016-06-10 MED ORDER — CLOTRIMAZOLE 1 % EX SOLN: 1.0000 "application " | Freq: Two times a day (BID) | CUTANEOUS | 5 refills | Status: DC

## 2016-06-10 MED ORDER — TERBINAFINE HCL 1 % EX CREA: 1.0000 "application " | TOPICAL_CREAM | Freq: Two times a day (BID) | CUTANEOUS | 4 refills | Status: DC

## 2016-06-10 MED ORDER — TERBINAFINE HCL 250 MG PO TABS: 250.0000 mg | ORAL_TABLET | Freq: Every day | ORAL | 2 refills | Status: DC

## 2016-06-10 NOTE — Progress Notes (Signed)
  Subjective: Larry Thomas is a 41 y.o. male patient seen today in office for discussion of bloodwork to start oral lamisil. Patient has no other pedal complaints at this time.   There are no active problems to display for this patient.   Current Outpatient Prescriptions on File Prior to Visit  Medication Sig Dispense Refill  . aspirin 81 MG chewable tablet Chew 1 tablet (81 mg total) by mouth daily. 60 tablet 1  . HYDROcodone-acetaminophen (NORCO/VICODIN) 5-325 MG tablet Take 1-2 tablets by mouth every 6 hours as needed for pain and/or cough. 7 tablet 0  . levothyroxine (SYNTHROID, LEVOTHROID) 200 MCG tablet Take 200 mcg by mouth daily at 6 (six) AM.    . Multiple Vitamins-Minerals (MULTIVITAMIN & MINERAL PO) Take 1 tablet by mouth daily.    . naproxen (NAPROSYN) 500 MG tablet Take 500 mg by mouth 2 (two) times daily as needed. pain  0  . oxyCODONE-acetaminophen (PERCOCET) 5-325 MG tablet 1-2 tabs po q6 hours prn pain (Patient not taking: Reported on 07/22/2015) 30 tablet 0  . tiZANidine (ZANAFLEX) 4 MG tablet Take 8 mg by mouth every 8 (eight) hours as needed. pain  0   No current facility-administered medications on file prior to visit.     No Known Allergies  Objective: Physical Exam  General: Well developed, nourished, no acute distress, awake, alert and oriented x 3  Vascular: Dorsalis pedis artery 2/4 bilateral, Posterior tibial artery 2/4 bilateral, skin temperature warm to warm proximal to distal bilateral lower extremities, no varicosities, pedal hair present bilateral.  Neurological: Gross sensation present via light touch bilateral.   Dermatological: Skin is warm, dry, and supple bilateral, Nails 1-10 are tender, short thick, and discolored with mild subungal debris, improved webspace macerations present bilateral, no open lesions present bilateral, no callus/corns/hyperkeratotic tissue present bilateral however diffuse dry skin. No signs of infection  bilateral.  Musculoskeletal: No boney deformities noted bilateral. Muscular strength within normal limits without painon range of motion. No pain with calf compression bilateral.  Fungal culture + Tinea   Assessment and Plan:  Problem List Items Addressed This Visit    None    Visit Diagnoses    Dermatophytosis of nail    -  Primary   Relevant Medications   terbinafine (LAMISIL AT) 1 % cream   clotrimazole (LOTRIMIN) 1 % external solution   terbinafine (LAMISIL) 250 MG tablet   Tinea       Relevant Medications   terbinafine (LAMISIL AT) 1 % cream   clotrimazole (LOTRIMIN) 1 % external solution   terbinafine (LAMISIL) 250 MG tablet   Dry skin       Relevant Medications   terbinafine (LAMISIL) 250 MG tablet      -Examined patient -Discussed treatment options for painful mycotic nails and dry skin possible tinea  -Patient opt for oral Lamisil with full understanding of medication risks; LFTs  Normal - Rx to pharmacy for lamisil 250mg  PO daily. Anticipate 12 week course.  -Patient use lamisil cream daily and to finish clotrimazole for in between toes; reordered for patient  -Advised good hygiene habits -Patient to return in 6 weeks for follow up evaluation/med check or sooner if symptoms worsen.  Asencion Islamitorya Katharine Rochefort, DPM

## 2016-07-22 ENCOUNTER — Encounter: Payer: Self-pay | Admitting: Sports Medicine

## 2016-07-22 ENCOUNTER — Ambulatory Visit (INDEPENDENT_AMBULATORY_CARE_PROVIDER_SITE_OTHER): Payer: Medicaid Other | Admitting: Sports Medicine

## 2016-07-22 DIAGNOSIS — M79674 Pain in right toe(s): Secondary | ICD-10-CM

## 2016-07-22 DIAGNOSIS — B359 Dermatophytosis, unspecified: Secondary | ICD-10-CM

## 2016-07-22 DIAGNOSIS — M79675 Pain in left toe(s): Secondary | ICD-10-CM | POA: Diagnosis not present

## 2016-07-22 DIAGNOSIS — B351 Tinea unguium: Secondary | ICD-10-CM

## 2016-07-22 NOTE — Progress Notes (Signed)
  Subjective: Larry Thomas is a 41 y.o. male patient seen today in office for med check on lamisil started 06-10-16. Patient has no other pedal complaints at this time.   There are no active problems to display for this patient.   Current Outpatient Prescriptions on File Prior to Visit  Medication Sig Dispense Refill  . aspirin 81 MG chewable tablet Chew 1 tablet (81 mg total) by mouth daily. 60 tablet 1  . clotrimazole (LOTRIMIN) 1 % external solution Apply 1 application topically 2 (two) times daily. In between toes 60 mL 5  . HYDROcodone-acetaminophen (NORCO/VICODIN) 5-325 MG tablet Take 1-2 tablets by mouth every 6 hours as needed for pain and/or cough. 7 tablet 0  . levothyroxine (SYNTHROID, LEVOTHROID) 200 MCG tablet Take 200 mcg by mouth daily at 6 (six) AM.    . Multiple Vitamins-Minerals (MULTIVITAMIN & MINERAL PO) Take 1 tablet by mouth daily.    . naproxen (NAPROSYN) 500 MG tablet Take 500 mg by mouth 2 (two) times daily as needed. pain  0  . oxyCODONE-acetaminophen (PERCOCET) 5-325 MG tablet 1-2 tabs po q6 hours prn pain (Patient not taking: Reported on 07/22/2015) 30 tablet 0  . terbinafine (LAMISIL AT) 1 % cream Apply 1 application topically 2 (two) times daily. 30 g 4  . terbinafine (LAMISIL) 250 MG tablet Take 1 tablet (250 mg total) by mouth daily. 30 tablet 2  . tiZANidine (ZANAFLEX) 4 MG tablet Take 8 mg by mouth every 8 (eight) hours as needed. pain  0   No current facility-administered medications on file prior to visit.     No Known Allergies  Objective: Physical Exam  General: Well developed, nourished, no acute distress, awake, alert and oriented x 3  Vascular: Dorsalis pedis artery 2/4 bilateral, Posterior tibial artery 2/4 bilateral, skin temperature warm to warm proximal to distal bilateral lower extremities, no varicosities, pedal hair present bilateral.  Neurological: Gross sensation present via light touch bilateral.   Dermatological: Skin is warm,  dry, and supple bilateral, Nails 1-10 are tender, short thick, and discolored with mild subungal debris, improved webspace macerations present bilateral, no open lesions present bilateral, no callus/corns/hyperkeratotic tissue present bilateral however diffuse dry skin. No signs of infection bilateral.  Musculoskeletal: No boney deformities noted bilateral. Muscular strength within normal limits without painon range of motion. No pain with calf compression bilateral.   Assessment and Plan:  Problem List Items Addressed This Visit    None    Visit Diagnoses    Dermatophytosis of nail    -  Primary   Relevant Orders   Hepatic Function Panel   Tinea       Relevant Orders   Hepatic Function Panel   Toe pain, bilateral       Relevant Orders   Hepatic Function Panel      -Examined patient -Discussed continued care and treatment options for painful mycotic nails and dry skin possible tinea  -New set of LFTs ordered if abnormal will call patient; otherwise continue with lamisil as instructed for total 12 weeks -Patient to continue to use lamisil cream daily and to finish clotrimazole for in between toes -Advised good hygiene habits -Patient to return in 6 weeks for follow up evaluation/final med check or sooner if symptoms worsen.  Asencion Islam, DPM

## 2016-07-23 LAB — HEPATIC FUNCTION PANEL
ALT: 24 U/L (ref 9–46)
AST: 23 U/L (ref 10–40)
Albumin: 4.4 g/dL (ref 3.6–5.1)
Alkaline Phosphatase: 49 U/L (ref 40–115)
Bilirubin, Direct: 0.1 mg/dL (ref <=0.2)
Indirect Bilirubin: 0.2 mg/dL (ref 0.2–1.2)
TOTAL PROTEIN: 7.1 g/dL (ref 6.1–8.1)
Total Bilirubin: 0.3 mg/dL (ref 0.2–1.2)

## 2016-07-25 ENCOUNTER — Telehealth: Payer: Self-pay | Admitting: *Deleted

## 2016-07-25 NOTE — Telephone Encounter (Addendum)
-----   Message from Asencion Islamitorya [place], [place] sent at 07/23/2016  7:39 AM EDT ----- Can you let patient know that his liver panel is normal. Continue PO lamisil and I will check him back in office in 6 weeks Dr. Marylene LandStover. 07/25/2016-Left message with Dr. Wynema BirchStover's orders.

## 2016-08-01 ENCOUNTER — Other Ambulatory Visit: Payer: Self-pay

## 2016-08-01 DIAGNOSIS — B359 Dermatophytosis, unspecified: Secondary | ICD-10-CM

## 2016-08-01 MED ORDER — CLOTRIMAZOLE 1 % EX CREA: 1.0000 "application " | TOPICAL_CREAM | Freq: Two times a day (BID) | CUTANEOUS | 0 refills | Status: DC

## 2016-09-02 ENCOUNTER — Encounter: Payer: Self-pay | Admitting: Sports Medicine

## 2016-09-02 ENCOUNTER — Ambulatory Visit (INDEPENDENT_AMBULATORY_CARE_PROVIDER_SITE_OTHER): Payer: Medicaid Other | Admitting: Sports Medicine

## 2016-09-02 DIAGNOSIS — M79674 Pain in right toe(s): Secondary | ICD-10-CM

## 2016-09-02 DIAGNOSIS — L853 Xerosis cutis: Secondary | ICD-10-CM

## 2016-09-02 DIAGNOSIS — M79675 Pain in left toe(s): Secondary | ICD-10-CM

## 2016-09-02 DIAGNOSIS — B351 Tinea unguium: Secondary | ICD-10-CM

## 2016-09-02 DIAGNOSIS — B359 Dermatophytosis, unspecified: Secondary | ICD-10-CM

## 2016-09-02 NOTE — Progress Notes (Signed)
  Subjective: Dwaine GaleMichael A Laforest is a 41 y.o. male patient seen today in office for med check on lamisil started 06-10-16. Patient denies any issues and has no other pedal complaints at this time.   There are no active problems to display for this patient.   Current Outpatient Prescriptions on File Prior to Visit  Medication Sig Dispense Refill  . aspirin 81 MG chewable tablet Chew 1 tablet (81 mg total) by mouth daily. 60 tablet 1  . clotrimazole (LOTRIMIN) 1 % cream Apply 1 application topically 2 (two) times daily. 30 g 0  . clotrimazole (LOTRIMIN) 1 % external solution Apply 1 application topically 2 (two) times daily. In between toes 60 mL 5  . HYDROcodone-acetaminophen (NORCO/VICODIN) 5-325 MG tablet Take 1-2 tablets by mouth every 6 hours as needed for pain and/or cough. 7 tablet 0  . levothyroxine (SYNTHROID, LEVOTHROID) 200 MCG tablet Take 200 mcg by mouth daily at 6 (six) AM.    . Multiple Vitamins-Minerals (MULTIVITAMIN & MINERAL PO) Take 1 tablet by mouth daily.    . naproxen (NAPROSYN) 500 MG tablet Take 500 mg by mouth 2 (two) times daily as needed. pain  0  . terbinafine (LAMISIL AT) 1 % cream Apply 1 application topically 2 (two) times daily. 30 g 4  . terbinafine (LAMISIL) 250 MG tablet Take 1 tablet (250 mg total) by mouth daily. 30 tablet 2  . tiZANidine (ZANAFLEX) 4 MG tablet Take 8 mg by mouth every 8 (eight) hours as needed. pain  0   No current facility-administered medications on file prior to visit.     No Known Allergies  Objective: Physical Exam  General: Well developed, nourished, no acute distress, awake, alert and oriented x 3  Vascular: Dorsalis pedis artery 2/4 bilateral, Posterior tibial artery 2/4 bilateral, skin temperature warm to warm proximal to distal bilateral lower extremities, no varicosities, pedal hair present bilateral.  Neurological: Gross sensation present via light touch bilateral.   Dermatological: Skin is warm, dry, and supple  bilateral, Nails 1-10 are tender, short thick, and discolored with mild subungal debris, improved webspace macerations present bilateral, no open lesions present bilateral, no callus/corns/hyperkeratotic tissue present bilateral however diffuse dry skin that is now improved. No signs of infection bilateral.  Musculoskeletal: No boney deformities noted bilateral. Muscular strength within normal limits without painon range of motion. No pain with calf compression bilateral.   Assessment and Plan:  Problem List Items Addressed This Visit    None    Visit Diagnoses    Dermatophytosis of nail    -  Primary   Tinea       Dry skin       Toe pain, bilateral         -Examined patient -Discussed continued care and treatment options for painful mycotic nails and dry skin possible tinea  -Continue with Lamisil until completed  -Patient completed creams with improvement -Advised good hygiene habits -Patient to return in 12 weeks for follow up evaluation after finishing Lamisil or sooner if symptoms worsen.  Asencion Islamitorya Marveline Profeta, DPM

## 2016-09-10 DIAGNOSIS — Z0110 Encounter for examination of ears and hearing without abnormal findings: Secondary | ICD-10-CM | POA: Diagnosis not present

## 2016-09-10 DIAGNOSIS — R030 Elevated blood-pressure reading, without diagnosis of hypertension: Secondary | ICD-10-CM | POA: Diagnosis not present

## 2016-09-10 DIAGNOSIS — Z125 Encounter for screening for malignant neoplasm of prostate: Secondary | ICD-10-CM | POA: Diagnosis not present

## 2016-09-10 DIAGNOSIS — E669 Obesity, unspecified: Secondary | ICD-10-CM | POA: Diagnosis not present

## 2016-09-10 DIAGNOSIS — Z131 Encounter for screening for diabetes mellitus: Secondary | ICD-10-CM | POA: Diagnosis not present

## 2016-09-10 DIAGNOSIS — E039 Hypothyroidism, unspecified: Secondary | ICD-10-CM | POA: Diagnosis not present

## 2016-09-10 DIAGNOSIS — H539 Unspecified visual disturbance: Secondary | ICD-10-CM | POA: Diagnosis not present

## 2016-09-10 DIAGNOSIS — E785 Hyperlipidemia, unspecified: Secondary | ICD-10-CM | POA: Diagnosis not present

## 2016-12-02 ENCOUNTER — Ambulatory Visit: Payer: Self-pay | Admitting: Sports Medicine

## 2017-03-29 ENCOUNTER — Encounter (HOSPITAL_COMMUNITY): Payer: Self-pay | Admitting: Nurse Practitioner

## 2017-03-29 ENCOUNTER — Emergency Department (HOSPITAL_COMMUNITY): Payer: Self-pay

## 2017-03-29 DIAGNOSIS — R5381 Other malaise: Secondary | ICD-10-CM | POA: Insufficient documentation

## 2017-03-29 DIAGNOSIS — Z87891 Personal history of nicotine dependence: Secondary | ICD-10-CM | POA: Insufficient documentation

## 2017-03-29 DIAGNOSIS — E039 Hypothyroidism, unspecified: Secondary | ICD-10-CM | POA: Insufficient documentation

## 2017-03-29 DIAGNOSIS — Z79899 Other long term (current) drug therapy: Secondary | ICD-10-CM | POA: Insufficient documentation

## 2017-03-29 DIAGNOSIS — E876 Hypokalemia: Secondary | ICD-10-CM | POA: Insufficient documentation

## 2017-03-29 DIAGNOSIS — R1012 Left upper quadrant pain: Secondary | ICD-10-CM | POA: Insufficient documentation

## 2017-03-29 LAB — BASIC METABOLIC PANEL
Anion gap: 9 (ref 5–15)
BUN: 15 mg/dL (ref 6–20)
CHLORIDE: 97 mmol/L — AB (ref 101–111)
CO2: 27 mmol/L (ref 22–32)
CREATININE: 1.32 mg/dL — AB (ref 0.61–1.24)
Calcium: 9.4 mg/dL (ref 8.9–10.3)
GFR calc Af Amer: >60 mL/min (ref >60)
GFR calc non Af Amer: >60 mL/min (ref >60)
GLUCOSE: 103 mg/dL — AB (ref 65–99)
Potassium: 3 mmol/L — ABNORMAL LOW (ref 3.5–5.1)
Sodium: 133 mmol/L — ABNORMAL LOW (ref 135–145)

## 2017-03-29 LAB — CBC
HCT: 43.8 % (ref 39.0–52.0)
Hemoglobin: 15.1 g/dL (ref 13.0–17.0)
MCH: 29.9 pg (ref 26.0–34.0)
MCHC: 34.5 g/dL (ref 30.0–36.0)
MCV: 86.7 fL (ref 78.0–100.0)
Platelets: 245 10*3/uL (ref 150–400)
RBC: 5.05 MIL/uL (ref 4.22–5.81)
RDW: 12.8 % (ref 11.5–15.5)
WBC: 7.4 10*3/uL (ref 4.0–10.5)

## 2017-03-29 LAB — I-STAT TROPONIN, ED: Troponin i, poc: 0 ng/mL (ref 0.00–0.08)

## 2017-03-29 NOTE — ED Triage Notes (Signed)
Pt states he is not feeling good. C/o of heart racing, nausea and dizziness.

## 2017-03-30 ENCOUNTER — Encounter (HOSPITAL_COMMUNITY): Payer: Self-pay

## 2017-03-30 ENCOUNTER — Emergency Department (HOSPITAL_COMMUNITY)
Admission: EM | Admit: 2017-03-30 | Discharge: 2017-03-30 | Disposition: A | Discharge status: Home / Self Care | Payer: Self-pay | Attending: Emergency Medicine | Admitting: Emergency Medicine

## 2017-03-30 ENCOUNTER — Other Ambulatory Visit: Payer: Self-pay

## 2017-03-30 ENCOUNTER — Emergency Department (HOSPITAL_COMMUNITY): Payer: Self-pay

## 2017-03-30 DIAGNOSIS — R1012 Left upper quadrant pain: Secondary | ICD-10-CM

## 2017-03-30 DIAGNOSIS — R5381 Other malaise: Secondary | ICD-10-CM

## 2017-03-30 DIAGNOSIS — E876 Hypokalemia: Secondary | ICD-10-CM

## 2017-03-30 LAB — URINALYSIS, ROUTINE W REFLEX MICROSCOPIC
BILIRUBIN URINE: NEGATIVE
Glucose, UA: NEGATIVE mg/dL
HGB URINE DIPSTICK: NEGATIVE
KETONES UR: NEGATIVE mg/dL
Leukocytes, UA: NEGATIVE
Nitrite: NEGATIVE
PROTEIN: NEGATIVE mg/dL
Specific Gravity, Urine: 1.043 — ABNORMAL HIGH (ref 1.005–1.030)
pH: 6 (ref 5.0–8.0)

## 2017-03-30 LAB — D-DIMER, QUANTITATIVE (NOT AT ARMC)

## 2017-03-30 MED ORDER — POTASSIUM CHLORIDE CRYS ER 20 MEQ PO TBCR: 20.0000 meq | EXTENDED_RELEASE_TABLET | Freq: Every day | ORAL | 0 refills | Status: DC

## 2017-03-30 MED ORDER — SODIUM CHLORIDE 0.9 % IV BOLUS (SEPSIS)
1000.0000 mL | Freq: Once | INTRAVENOUS | Status: AC
  Administered 2017-03-30: 1000 mL via INTRAVENOUS

## 2017-03-30 MED ORDER — IOPAMIDOL (ISOVUE-300) INJECTION 61%
INTRAVENOUS | Status: AC
  Administered 2017-03-30: 100 mL via INTRAVENOUS
  Filled 2017-03-30: qty 100

## 2017-03-30 MED ORDER — IOPAMIDOL (ISOVUE-300) INJECTION 61%
100.0000 mL | Freq: Once | INTRAVENOUS | Status: AC | PRN
  Administered 2017-03-30: 100 mL via INTRAVENOUS

## 2017-03-30 MED ORDER — POTASSIUM CHLORIDE CRYS ER 20 MEQ PO TBCR
40.0000 meq | EXTENDED_RELEASE_TABLET | Freq: Once | ORAL | Status: AC
  Administered 2017-03-30: 40 meq via ORAL
  Filled 2017-03-30: qty 2

## 2017-03-30 NOTE — ED Provider Notes (Signed)
[facility]-EMERGENCY DEPT Provider Note: Lowella Dell, MD, FACEP  CSN: [id number] MRN: [id number] ARRIVAL: 03/29/17 at 1829 ROOM: WTR7/[facility]TR7   CHIEF COMPLAINT  Abdominal Pain   HISTORY OF PRESENT ILLNESS  03/30/17 2:44 AM Larry Thomas is a 42 y.o. male with a history of hypothyroidism on thyroid replacement.  He has had about a 8-month history of intermittent pain and weakness in his muscles, particularly his shoulders.  At times he has difficulty raising his arms.  He has also had about a 27-month history of intermittent white scotomata in his peripheral vision.  He has also had constipation  He is here now because of multiple symptoms that developed yesterday.  He developed severe left upper quadrant abdominal pain which was exacerbated with movement and somewhat with deep breathing.  He also had nausea and dizziness yesterday with an episode of his heart racing.  He was having increased urinary output which is now improved.  He feels he is dehydrated and his mouth is dry.  He continues to have pain in his left upper quadrant.  He is having general malaise.  His nausea is no longer present.    Past Medical History:  Diagnosis Date  . Hypercholesterolemia   . Hypothyroidism   . Thyroid disease     Past Surgical History:  Procedure Laterality Date  . BILATERAL CARPAL TUNNEL RELEASE Bilateral 04/12/2015   Procedure: BILATERAL CARPAL TUNNEL RELEASE;  Surgeon: Betha Loa, MD;  Location: [facility];  Service: Orthopedics;  Laterality: Bilateral;    History reviewed. No pertinent family history.  Social History   Tobacco Use  . Smoking status: Former Games developer  . Smokeless tobacco: Never Used  Substance Use Topics  . Alcohol use: No  . Drug use: No    Prior to Admission medications   Medication Sig Start Date End Date Taking? Authorizing Provider  hydrochlorothiazide (HYDRODIURIL) 25 MG tablet Take 25 mg by mouth daily. 01/23/17  Yes [provider]    levothyroxine (SYNTHROID, LEVOTHROID) 75 MCG tablet Take 75 mcg by mouth daily. 03/25/17  Yes [provider]  Multiple Vitamins-Minerals (MULTIVITAMIN & MINERAL PO) Take 1 tablet by mouth daily.   Yes [provider]  aspirin 81 MG chewable tablet Chew 1 tablet (81 mg total) by mouth daily. Patient not taking: Reported on 03/30/2017 07/22/15   Pisciotta, Joni Reining, PA-C  clotrimazole (LOTRIMIN) 1 % cream Apply 1 application topically 2 (two) times daily. Patient not taking: Reported on 03/30/2017 08/01/16   Asencion Islam, DPM  terbinafine (LAMISIL AT) 1 % cream Apply 1 application topically 2 (two) times daily. Patient not taking: Reported on 03/30/2017 06/10/16   Asencion Islam, DPM  terbinafine (LAMISIL) 250 MG tablet Take 1 tablet (250 mg total) by mouth daily. Patient not taking: Reported on 03/30/2017 06/10/16   Asencion Islam, DPM    Allergies Patient has no known allergies.   REVIEW OF SYSTEMS  Negative except as noted here or in the History of Present Illness.   PHYSICAL EXAMINATION  Initial Vital Signs Blood pressure 117/75, pulse 64, temperature 98.6 F (37 C), temperature source Oral, resp. rate 18, SpO2 100 %.  Examination General: Well-developed, well-nourished male in no acute distress; appearance consistent with age of record HENT: normocephalic; atraumatic Eyes: pupils equal, round and reactive to light; extraocular muscles intact Neck: supple Heart: regular rate and rhythm Lungs: clear to auscultation bilaterally Abdomen: soft; nondistended; left upper quadrant tenderness; no masses or hepatosplenomegaly appreciated; bowel sounds present Extremities: No deformity;  full range of motion; pulses normal Neurologic: Awake, alert and oriented; motor function intact in all extremities and symmetric; no facial droop Skin: Warm and dry Psychiatric: Flat affect   RESULTS  Summary of this visit's results, reviewed by myself:   EKG  Interpretation  Date/Time:  Sunday March 29 2017 18:43:03 EST Ventricular Rate:  74 PR Interval:    QRS Duration: 89 QT Interval:  370 QTC Calculation: 411 R Axis:   69 Text Interpretation:  Sinus rhythm Premature atrial complexes Anteroseptal infarct, old No significant change was found Confirmed by Paula Libra ([id number]) on 03/30/2017 2:43:06 AM      Laboratory Studies: Results for orders placed or performed during the hospital encounter of 03/30/17 (from the past 24 hour(s))  Basic metabolic panel     Status: Abnormal   Collection Time: 03/29/17  7:20 PM  Result Value Ref Range   Sodium 133 (L) 135 - 145 mmol/L   Potassium 3.0 (L) 3.5 - 5.1 mmol/L   Chloride 97 (L) 101 - 111 mmol/L   CO2 27 22 - 32 mmol/L   Glucose, Bld 103 (H) 65 - 99 mg/dL   BUN 15 6 - 20 mg/dL   Creatinine, Ser 6.04 (H) 0.61 - 1.24 mg/dL   Calcium 9.4 8.9 - 54.0 mg/dL   GFR calc non Af Amer >60 >60 mL/min   GFR calc Af Amer >60 >60 mL/min   Anion gap 9 5 - 15  CBC     Status: None   Collection Time: 03/29/17  7:20 PM  Result Value Ref Range   WBC 7.4 4.0 - 10.5 K/uL   RBC 5.05 4.22 - 5.81 MIL/uL   Hemoglobin 15.1 13.0 - 17.0 g/dL   HCT 98.1 19.1 - 47.8 %   MCV 86.7 78.0 - 100.0 fL   MCH 29.9 26.0 - 34.0 pg   MCHC 34.5 30.0 - 36.0 g/dL   RDW 29.5 62.1 - 30.8 %   Platelets 245 150 - 400 K/uL  D-dimer, quantitative (not at [facility])     Status: None   Collection Time: 03/29/17  7:28 PM  Result Value Ref Range   D-Dimer, Quant <0.27 0.00 - 0.50 ug/mL-FEU  I-stat troponin, ED     Status: None   Collection Time: 03/29/17  7:33 PM  Result Value Ref Range   Troponin i, poc 0.00 0.00 - 0.08 ng/mL   Comment 3           Imaging Studies: Dg Chest 2 View  Result Date: 03/29/2017 CLINICAL DATA:  Tachycardia, nausea and dizziness.  Malaise. EXAM: CHEST  2 VIEW COMPARISON:  07/22/2015 FINDINGS: The heart size and mediastinal contours are within normal limits. Both lungs are clear. The visualized skeletal  structures are unremarkable. IMPRESSION: No active cardiopulmonary disease. Electronically Signed   By: Tollie Eth M.D.   On: 03/29/2017 20:20   Ct Abdomen Pelvis W Contrast  Result Date: 03/30/2017 CLINICAL DATA:  Left upper quadrant pain. EXAM: CT ABDOMEN AND PELVIS WITH CONTRAST TECHNIQUE: Multidetector CT imaging of the abdomen and pelvis was performed using the standard protocol following bolus administration of intravenous contrast. CONTRAST:  100 cc Isovue-300 IV COMPARISON:  CT 07/16/2011 FINDINGS: Lower chest: Linear left lower lobe scarring, unchanged. No consolidation or pleural effusion. Hepatobiliary: Hepatic steatosis without focal lesion. Gallbladder physiologically distended, no calcified stone. No biliary dilatation. Pancreas: No ductal dilatation or inflammation. Spleen: Normal in size without focal abnormality. Adrenals/Urinary Tract: Normal adrenal glands. No hydronephrosis or perinephric edema. Homogeneous  renal enhancement with symmetric excretion on delayed phase imaging. Urinary bladder is physiologically distended without wall thickening. Stomach/Bowel: Stomach is nondistended, no gastric wall thickening. No evidence of bowel wall thickening, distention or inflammatory change. Normal appendix, for example image 48 series 4. Small to moderate colonic stool burden without colonic wall thickening or inflammation. Vascular/Lymphatic: Mild aortic atherosclerosis. No acute vascular finding. No enlarged abdominal or pelvic lymph nodes. Reproductive: Prostate is unremarkable. Other: No free air, free fluid, or intra-abdominal fluid collection. Musculoskeletal: Mild degenerative disc disease in the lumbar spine. There are no acute or suspicious osseous abnormalities. IMPRESSION: 1. No acute abnormality or explanation for abdominal pain. 2. Hepatic steatosis. 3. Mild aortic atherosclerosis (ICD10-I70.0). Electronically Signed   By: Rubye OaksMelanie  Ehinger M.D.   On: 03/30/2017 05:31    ED COURSE   Nursing notes and initial vitals signs, including pulse oximetry, reviewed.  Vitals:   03/29/17 1840 03/29/17 2323 03/30/17 0528  BP: (!) 155/89 117/75 105/85  Pulse: 86 64 60  Resp: 20 18 18   Temp: 98.4 F (36.9 C) 98.6 F (37 C) 98.2 F (36.8 C)  TempSrc: Oral Oral Oral  SpO2: 100% 100% 100%   5:44 AM Patient advised of reassuring lab tests and CT scan.  The cause of his left upper quadrant pain and tenderness is not clear at this time.  We will replete his potassium.  He was advised that some of his chronic symptoms may be related to his thyroid and he needs to reestablish with a primary care physician to have this evaluated.  He is feeling significantly better after IV hydration.  PROCEDURES    ED DIAGNOSES     ICD-10-CM   1. Left upper quadrant pain R10.12   2. Hypokalemia E87.6   3. Malaise R53.81        Paula LibraMolpus, Brette Cast, MD 03/30/17 [phone]

## 2017-03-30 NOTE — ED Notes (Signed)
Patient transported to CT 

## 2017-03-30 NOTE — ED Notes (Signed)
Bed: WA20 Expected date:  Expected time:  Means of arrival:  Comments: 

## 2017-03-30 NOTE — ED Notes (Signed)
Returned from CT.

## 2017-03-30 NOTE — ED Notes (Signed)
ED Provider at bedside. 

## 2017-04-01 ENCOUNTER — Ambulatory Visit (HOSPITAL_COMMUNITY)
Admission: EM | Admit: 2017-04-01 | Discharge: 2017-04-01 | Disposition: A | Discharge status: Home / Self Care | Payer: Medicaid Other | Attending: Family Medicine | Admitting: Family Medicine

## 2017-04-01 ENCOUNTER — Encounter (HOSPITAL_COMMUNITY): Payer: Self-pay | Admitting: Emergency Medicine

## 2017-04-01 DIAGNOSIS — M545 Low back pain, unspecified: Secondary | ICD-10-CM

## 2017-04-01 DIAGNOSIS — M62830 Muscle spasm of back: Secondary | ICD-10-CM

## 2017-04-01 LAB — POCT URINALYSIS DIP (DEVICE)
BILIRUBIN URINE: NEGATIVE
Glucose, UA: NEGATIVE mg/dL
Hgb urine dipstick: NEGATIVE
KETONES UR: NEGATIVE mg/dL
Leukocytes, UA: NEGATIVE
Nitrite: NEGATIVE
Protein, ur: NEGATIVE mg/dL
Specific Gravity, Urine: 1.015 (ref 1.005–1.030)
Urobilinogen, UA: 0.2 mg/dL (ref 0.0–1.0)
pH: 7 (ref 5.0–8.0)

## 2017-04-01 LAB — POCT I-STAT, CHEM 8
BUN: 12 mg/dL (ref 6–20)
Calcium, Ion: 1.21 mmol/L (ref 1.15–1.40)
Chloride: 100 mmol/L — ABNORMAL LOW (ref 101–111)
Creatinine, Ser: 1.1 mg/dL (ref 0.61–1.24)
Glucose, Bld: 85 mg/dL (ref 65–99)
HEMATOCRIT: 46 % (ref 39.0–52.0)
HEMOGLOBIN: 15.6 g/dL (ref 13.0–17.0)
POTASSIUM: 3.8 mmol/L (ref 3.5–5.1)
Sodium: 139 mmol/L (ref 135–145)
TCO2: 26 mmol/L (ref 22–32)

## 2017-04-01 MED ORDER — KETOROLAC TROMETHAMINE 60 MG/2ML IM SOLN
60.0000 mg | Freq: Once | INTRAMUSCULAR | Status: AC
  Administered 2017-04-01: 60 mg via INTRAMUSCULAR

## 2017-04-01 MED ORDER — CYCLOBENZAPRINE HCL 10 MG PO TABS: 10.0000 mg | ORAL_TABLET | Freq: Three times a day (TID) | ORAL | 0 refills | Status: DC

## 2017-04-01 MED ORDER — KETOROLAC TROMETHAMINE 60 MG/2ML IM SOLN
INTRAMUSCULAR | Status: AC
  Filled 2017-04-01: qty 2

## 2017-04-01 NOTE — ED Triage Notes (Signed)
PT C/O: reports he woke up w/lower back pain onset this am.... [facility]  Sx today include frequent urination and diaphoresis, nauseas  DENIES: fevers, inj/trauma  TAKING MEDS: Potassium, Aspirin   A&O x4... NAD... Ambulatory

## 2017-04-01 NOTE — Discharge Instructions (Signed)
Be aware, your muscle relaxant medication may cause drowsiness. Please do not drive, operate heavy machinery or make important decisions while on this medication, it can cloud your judgement. You may take Aleve twice daily with food. Please return if you are not feeling better within the next 48 hours.  HOME CARE INSTRUCTIONS: For many people, back pain returns. Since low back pain is rarely dangerous, it is often a condition that people can learn to manage on their own. Please remain active. It is stressful on the back to sit or stand in one place. Do not sit, drive, or stand in one place for more than 30 minutes at a time. Take short walks on level surfaces as soon as pain allows. Try to increase the length of time you walk each day. Do not stay in bed. Resting more than 1 or 2 days can delay your recovery. Do not avoid exercise or work. Your body is made to move. It is not dangerous to be active, even though your back may hurt. Your back will likely heal faster if you return to being active before your pain is gone. Over-the-counter medicines to reduce pain and inflammation are often the most helpful.  SEEK MEDICAL CARE IF: You have pain that is not relieved with rest or medicine. You have pain that does not improve in 1 week. You have new symptoms. You are generally not feeling well.  SEEK IMMEDIATE MEDICAL CARE IF: You have pain that radiates from your back into your legs. You develop new bowel or bladder control problems. You have unusual weakness or numbness in your arms or legs. You develop nausea or vomiting. You develop abdominal pain. You feel faint.

## 2017-04-02 NOTE — ED Provider Notes (Signed)
[facility]   [id number] 04/01/17 Arrival Time: 1208  ASSESSMENT & PLAN:  1. Acute left-sided low back pain without sciatica   2. Muscle spasm of back     Meds ordered this encounter  Medications  . ketorolac (TORADOL) injection 60 mg  . cyclobenzaprine (FLEXERIL) 10 MG tablet    Sig: Take 1 tablet (10 mg total) by mouth 3 (three) times daily.    Dispense:  20 tablet    Refill:  0   Ensure mobility as tolerated. Medication sedation precautions. Continue ibuprofen for the next few days. Will f/u with PCP if not improving within 1 week, sooner if needed.  Reviewed expectations re: course of current medical issues. Questions answered. Outlined signs and symptoms indicating need for more acute intervention. Patient verbalized understanding. After Visit Summary given.   SUBJECTIVE: History from: patient.  [facility] Note reviewed. Negative CT of abdomen and pelvis.  Larry Thomas is a 42 y.o. male who presents with complaint of persistent left sided lower back discomfort. Onset abrupt beginning this morning. Injury/trama: no. History of back problems: yes. Previous back surgery: no. Discomfort described as aching and sharp without radiation. Certain movements exacerbate the described discomfort. Better with rest. Extremity sensation changes or weakness: none. Ambulatory without difficulty. Normal bowel/bladder habits. No associated abdominal pain/n/v. Self treatment: tried OTCs without relief of pain.  ROS: As per HPI.   OBJECTIVE:  Vitals:   04/01/17 1231  BP: 121/85  Pulse: (!) 54  Resp: 20  Temp: 98.3 F (36.8 C)  TempSrc: Oral  SpO2: 100%    General appearance: alert; no distress Neck: supple with FROM; without midline tenderness Lungs: unlabored respirations; symmetrical air entry Abdomen: soft, non-tender; bowel sounds normal; no masses or organomegaly; no guarding or rebound tenderness Back: left sided lower tenderness present over  left paralumbar muscles; FROM at hips with mild discomfort reported; bruising: none; without midline tenderness Extremities: no cyanosis or edema; symmetrical with no gross deformities Skin: warm and dry Neurologic: normal gait; normal symmetric reflexes; normal LE strength and sensation Psychological: alert and cooperative; normal mood and affect  Labs: Results for orders placed or performed during the hospital encounter of 04/01/17  I-STAT, chem 8  Result Value Ref Range   Sodium 139 135 - 145 mmol/L   Potassium 3.8 3.5 - 5.1 mmol/L   Chloride 100 (L) 101 - 111 mmol/L   BUN 12 6 - 20 mg/dL   Creatinine, Ser 8.461.10 0.61 - 1.24 mg/dL   Glucose, Bld 85 65 - 99 mg/dL   Calcium, Ion 9.621.21 9.521.15 - 1.40 mmol/L   TCO2 26 22 - 32 mmol/L   Hemoglobin 15.6 13.0 - 17.0 g/dL   HCT 84.146.0 32.439.0 - 40.152.0 %  POCT urinalysis dip (device)  Result Value Ref Range   Glucose, UA NEGATIVE NEGATIVE mg/dL   Bilirubin Urine NEGATIVE NEGATIVE   Ketones, ur NEGATIVE NEGATIVE mg/dL   Specific Gravity, Urine 1.015 1.005 - 1.030   Hgb urine dipstick NEGATIVE NEGATIVE   pH 7.0 5.0 - 8.0   Protein, ur NEGATIVE NEGATIVE mg/dL   Urobilinogen, UA 0.2 0.0 - 1.0 mg/dL   Nitrite NEGATIVE NEGATIVE   Leukocytes, UA NEGATIVE NEGATIVE   Labs Reviewed  POCT I-STAT, CHEM 8 - Abnormal; Notable for the following components:      Result Value   Chloride 100 (*)    All other components within normal limits  POCT URINALYSIS DIP (DEVICE)    No Known Allergies  Past Medical History:  Diagnosis Date  . Hypercholesterolemia   . Hypothyroidism   . Thyroid disease    Social History   Socioeconomic History  . Marital status: Married    Spouse name: Not on file  . Number of children: Not on file  . Years of education: Not on file  . Highest education level: Not on file  Social Needs  . Financial resource strain: Not on file  . Food insecurity - worry: Not on file  . Food insecurity - inability: Not on file  .  Transportation needs - medical: Not on file  . Transportation needs - non-medical: Not on file  Occupational History  . Not on file  Tobacco Use  . Smoking status: Former Games developer  . Smokeless tobacco: Never Used  Substance and Sexual Activity  . Alcohol use: No  . Drug use: No  . Sexual activity: Not on file  Other Topics Concern  . Not on file  Social History Narrative  . Not on file   History reviewed. No pertinent family history. Past Surgical History:  Procedure Laterality Date  . BILATERAL CARPAL TUNNEL RELEASE Bilateral 04/12/2015   Procedure: BILATERAL CARPAL TUNNEL RELEASE;  Surgeon: Betha Loa, MD;  Location: [facility];  Service: Orthopedics;  Laterality: Bilateral;     Mardella Layman, MD 04/02/17 [phone]

## 2017-05-18 ENCOUNTER — Ambulatory Visit: Payer: Self-pay | Attending: Nurse Practitioner | Admitting: Nurse Practitioner

## 2017-05-18 ENCOUNTER — Other Ambulatory Visit: Payer: Self-pay

## 2017-05-18 ENCOUNTER — Encounter: Payer: Self-pay | Admitting: Nurse Practitioner

## 2017-05-18 VITALS — BP 121/83 | HR 51 | Temp 98.2°F | Ht 69.0 in | Wt 239.0 lb

## 2017-05-18 DIAGNOSIS — E782 Mixed hyperlipidemia: Secondary | ICD-10-CM

## 2017-05-18 DIAGNOSIS — I10 Essential (primary) hypertension: Secondary | ICD-10-CM | POA: Insufficient documentation

## 2017-05-18 DIAGNOSIS — R001 Bradycardia, unspecified: Secondary | ICD-10-CM | POA: Insufficient documentation

## 2017-05-18 DIAGNOSIS — Z760 Encounter for issue of repeat prescription: Secondary | ICD-10-CM | POA: Insufficient documentation

## 2017-05-18 DIAGNOSIS — R0789 Other chest pain: Secondary | ICD-10-CM | POA: Insufficient documentation

## 2017-05-18 DIAGNOSIS — Z9889 Other specified postprocedural states: Secondary | ICD-10-CM | POA: Insufficient documentation

## 2017-05-18 DIAGNOSIS — E7800 Pure hypercholesterolemia, unspecified: Secondary | ICD-10-CM | POA: Insufficient documentation

## 2017-05-18 DIAGNOSIS — E039 Hypothyroidism, unspecified: Secondary | ICD-10-CM | POA: Insufficient documentation

## 2017-05-18 DIAGNOSIS — E876 Hypokalemia: Secondary | ICD-10-CM | POA: Insufficient documentation

## 2017-05-18 DIAGNOSIS — R0602 Shortness of breath: Secondary | ICD-10-CM | POA: Insufficient documentation

## 2017-05-18 DIAGNOSIS — Z79899 Other long term (current) drug therapy: Secondary | ICD-10-CM | POA: Insufficient documentation

## 2017-05-18 DIAGNOSIS — Z0000 Encounter for general adult medical examination without abnormal findings: Secondary | ICD-10-CM

## 2017-05-18 HISTORY — DX: Mixed hyperlipidemia: E78.2

## 2017-05-18 MED ORDER — AMLODIPINE BESYLATE 10 MG PO TABS: 10.0000 mg | ORAL_TABLET | Freq: Every day | ORAL | 3 refills | Status: DC

## 2017-05-18 MED ORDER — FLUTICASONE PROPIONATE 50 MCG/ACT NA SUSP: 1.0000 | Freq: Every day | NASAL | 6 refills | Status: DC

## 2017-05-18 MED ORDER — CYCLOBENZAPRINE HCL 10 MG PO TABS: 10.0000 mg | ORAL_TABLET | Freq: Three times a day (TID) | ORAL | 1 refills | Status: DC

## 2017-05-18 MED ORDER — AMLODIPINE BESYLATE 5 MG PO TABS: 5.0000 mg | ORAL_TABLET | Freq: Every day | ORAL | 0 refills | Status: DC

## 2017-05-18 MED FILL — ?AMLODIPINE BESYLATE 5 MG T: 5 MG | 30 days supply | Qty: 30 | Fill #0

## 2017-05-18 MED FILL — AMLODIPINE BESYLATE 10 MG T: 10 | 30 days supply | Qty: 30 | Fill #0

## 2017-05-18 MED FILL — CYCLOBENZAPRINE 10 MG TAB: 10 | 10 days supply | Qty: 30 | Fill #0

## 2017-05-18 MED FILL — FLUTICASONE PROP 50 MCG SPR: 50 | 30 days supply | Qty: 16 | Fill #0

## 2017-05-18 NOTE — Progress Notes (Signed)
;;.;;  left,kkk   Hypothyroidism Chronic. Diagnosed over 5 years ago. He had the radioactive iodine treatment. Needs thyroid medicine however I will not refill util labs  ROS  Past Medical History:  Diagnosis Date  . Hypercholesterolemia   . Hypertension   . Hypothyroidism   . Thyroid disease     Past Surgical History:  Procedure Laterality Date  . BILATERAL CARPAL TUNNEL RELEASE Bilateral 04/12/2015   Procedure: BILATERAL CARPAL TUNNEL RELEASE;  Surgeon: Betha LoaKevin Kuzma, MD;  Location: [facility];  Service: Orthopedics;  Laterality: Bilateral;    History reviewed. No pertinent family history.  Social History Reviewed with no changes to be made today.   Outpatient Medications Prior to Visit  Medication Sig Dispense Refill  . hydrochlorothiazide (HYDRODIURIL) 25 MG tablet Take 25 mg by mouth daily.  5  . levothyroxine (SYNTHROID, LEVOTHROID) 75 MCG tablet Take 75 mcg by mouth daily.  2  . cyclobenzaprine (FLEXERIL) 10 MG tablet Take 1 tablet (10 mg total) by mouth 3 (three) times daily. (Patient not taking: Reported on 05/18/2017) 20 tablet 0  . Multiple Vitamins-Minerals (MULTIVITAMIN & MINERAL PO) Take 1 tablet by mouth daily.    . potassium chloride SA (K-DUR,KLOR-CON) 20 MEQ tablet Take 1 tablet (20 mEq total) by mouth daily. (Patient not taking: Reported on 05/18/2017) 5 tablet 0   No facility-administered medications prior to visit.     No Known Allergies     Objective:    BP 121/83 (BP Location: Left Arm, Patient Position: Sitting, Cuff Size: Normal)   Pulse (!) 51   Temp 98.2 F (36.8 C) (Oral)   Ht 5\' 9"  (1.753 m)   Wt 239 lb (108.4 kg)   SpO2 100%   BMI 35.29 kg/m  Wt Readings from Last 3 Encounters:  05/18/17 239 lb (108.4 kg)  04/12/15 231 lb 6 oz (105 kg)  05/31/14 230 lb (104.3 kg)    Physical Exam       Patient has been counseled extensively about nutrition and exercise as well as the importance of adherence with medications and  regular follow-up. The patient was given clear instructions to go to ER or return to medical center if symptoms don't improve, worsen or new problems develop. The patient verbalized understanding.   Follow-up: Return for BP recheck, Needs appointment with financial representative.Larry Thomas.   Larry Leard W Lorenzo Arscott, FNP-BC [facility] [place], [place] [phone]   05/18/2017, 9:57 AM

## 2017-05-18 NOTE — Progress Notes (Addendum)
Assessment & Plan:  Larry Thomas was seen today for new patient (initial visit), chest pain and medication refill.  Diagnoses and all orders for this visit:  Essential hypertension -     Basic metabolic panel -     amLODipine (NORVASC) 5 MG tablet; Take 1 tablet (5 mg total) by mouth daily.  Continue all antihypertensives as prescribed.  Remember to bring in your blood pressure log with you for your follow up appointment.  DASH/Mediterranean Diets are healthier choices for HTN.  -     Lipid panel  Bradycardia -     Thyroid Panel With TSH   Routine adult health maintenance -     HIV antibody (with reflex) -     fluticasone (FLONASE) 50 MCG/ACT nasal spray; Place 1 spray into both nostrils daily.  Chest wall discomfort -     cyclobenzaprine (FLEXERIL) 10 MG tablet; Take 1 tablet (10 mg total) by mouth 3 (three) times daily. -     EKG 12-Lead  Patient has been counseled on age-appropriate routine health concerns for screening and prevention. These are reviewed and up-to-date. Referrals have been placed accordingly. Immunizations are up-to-date or declined.    Subjective:   Chief Complaint  Patient presents with  . New Patient (Initial Visit)    Patient is here as a new patient.   . Chest Pain    Patient stated at night sometimes he get chest pain on the left side with throbbing and shortness of breath for last 3 weeks.   . Medication Refill    Ran out of Hydrochlorothizide for 3 weeks.    HPI Larry Thomas 42 y.o. male presents to office today to establish care.  Essential Hypertension Stable on HCTZ 25mg  daily. He has been out of his blood pressure medication for a few weeks. Denies palpitations, lightheadedness, dizziness, headaches or BLE edema. He endorses right sided chest discomfort and shortness of breath. His last labs showed hyponatremia and hypokalemia. Will switch HCTZ to amlodipine.  BP Readings from Last 3 Encounters:  05/18/17 121/83  04/01/17 121/85    03/30/17 105/85   Chest Pain Patient complains of right sided non radiating chest pain. Onset was 3 weeks ago after he ran out of his HCTZ, with unchanged course since that time. The patient describes the pain as intermittent, awakening patient from sleep, pressure like in nature, does not radiate. Patient rates pain as a 5/10 in intensity.  Associated symptoms are chest pressure/discomfort and shortness of breath. Aggravating factors are lifting his right arm.  Alleviating factors are: rubbing vicks vapor rub on his chest. Patient's cardiac risk factors are hypertension, male gender and obesity (BMI >= 30 kg/m2).  Patient's risk factors for DVT/PE: none. Previous cardiac testing: electrocardiogram (ECG).   Hypothyroidism Chronic. Diagnosed over 3-4 years ago. Initially with Graves Disease which required radioiodine treatment and thereafter levothyroxine. He has not had his thyroid level checked in over a year. Will recheck today. He endorses chest discomfort, shortness of breath and "I just don't feel like myself".  Lab Results  Component Value Date   TSH 1.807 07/22/2015    Review of Systems  Constitutional: Negative for fever, malaise/fatigue and weight loss.  HENT: Negative.  Negative for nosebleeds.   Eyes: Negative.  Negative for blurred vision, double vision and photophobia.  Respiratory: Positive for shortness of breath. Negative for cough, hemoptysis, sputum production and wheezing.   Cardiovascular: Positive for chest pain. Negative for palpitations, orthopnea, claudication, leg swelling and PND.  Gastrointestinal: Negative.  Negative for abdominal pain, constipation, diarrhea, heartburn, nausea and vomiting.  Musculoskeletal: Negative.  Negative for myalgias.  Neurological: Negative.  Negative for dizziness, focal weakness, seizures and headaches.  Endo/Heme/Allergies: Negative for environmental allergies.  Psychiatric/Behavioral: Negative for suicidal ideas. The patient is  nervous/anxious.     Past Medical History:  Diagnosis Date  . Hypercholesterolemia   . Hypertension   . Hypothyroidism   . Thyroid disease     Past Surgical History:  Procedure Laterality Date  . BILATERAL CARPAL TUNNEL RELEASE Bilateral 04/12/2015   Procedure: BILATERAL CARPAL TUNNEL RELEASE;  Surgeon: Betha Loa, MD;  Location: [facility];  Service: Orthopedics;  Laterality: Bilateral;    History reviewed. No pertinent family history.  Social History Reviewed with no changes to be made today.   Outpatient Medications Prior to Visit  Medication Sig Dispense Refill  . levothyroxine (SYNTHROID, LEVOTHROID) 75 MCG tablet Take 75 mcg by mouth daily.  2  . hydrochlorothiazide (HYDRODIURIL) 25 MG tablet Take 25 mg by mouth daily.  5  . Multiple Vitamins-Minerals (MULTIVITAMIN & MINERAL PO) Take 1 tablet by mouth daily.    . cyclobenzaprine (FLEXERIL) 10 MG tablet Take 1 tablet (10 mg total) by mouth 3 (three) times daily. (Patient not taking: Reported on 05/18/2017) 20 tablet 0  . potassium chloride SA (K-DUR,KLOR-CON) 20 MEQ tablet Take 1 tablet (20 mEq total) by mouth daily. (Patient not taking: Reported on 05/18/2017) 5 tablet 0   No facility-administered medications prior to visit.     No Known Allergies     Objective:    BP 121/83 (BP Location: Left Arm, Patient Position: Sitting, Cuff Size: Normal)   Pulse (!) 51   Temp 98.2 F (36.8 C) (Oral)   Ht 5\' 9"  (1.753 m)   Wt 239 lb (108.4 kg)   SpO2 100%   BMI 35.29 kg/m  Wt Readings from Last 3 Encounters:  05/18/17 239 lb (108.4 kg)  04/12/15 231 lb 6 oz (105 kg)  05/31/14 230 lb (104.3 kg)    Physical Exam  Constitutional: He is oriented to person, place, and time. He appears well-developed and well-nourished. He is cooperative.  HENT:  Head: Normocephalic and atraumatic.  Eyes: EOM are normal.  Neck: Normal range of motion.  Cardiovascular: Regular rhythm, S1 normal, S2 normal, normal heart sounds  and intact distal pulses. Bradycardia present. Exam reveals no gallop and no friction rub.  No murmur heard. Pulmonary/Chest: Effort normal and breath sounds normal. No tachypnea. No respiratory distress. He has no decreased breath sounds. He has no wheezes. He has no rhonchi. He has no rales. He exhibits tenderness (reproducible).    Abdominal: Soft. Bowel sounds are normal.  Musculoskeletal: Normal range of motion. He exhibits no edema.  Neurological: He is alert and oriented to person, place, and time. Coordination normal.  Skin: Skin is warm and dry.  Psychiatric: He has a normal mood and affect. His behavior is normal. Judgment and thought content normal.  Nursing note and vitals reviewed.     Patient has been counseled extensively about nutrition and exercise as well as the importance of adherence with medications and regular follow-up. The patient was given clear instructions to go to ER or return to medical center if symptoms don't improve, worsen or new problems develop. The patient verbalized understanding.   Follow-up: Return for BP recheck, Needs appointment with financial representative.Claiborne Rigg, FNP-BC [facility] [place], [place] [phone]  05/18/2017, 11:57 AM

## 2017-05-18 NOTE — Patient Instructions (Addendum)
You can try nasocort, nasonex, or flonase/ fluticasone over the counter     Dyslipidemia Dyslipidemia is an imbalance of waxy, fat-like substances (lipids) in the blood. The body needs lipids in small amounts. Dyslipidemia often involves a high level of cholesterol or triglycerides, which are types of lipids. Common forms of dyslipidemia include:  High levels of bad cholesterol (LDL cholesterol). LDL is the type of cholesterol that causes fatty deposits (plaques) to build up in the blood vessels that carry blood away from your heart (arteries).  Low levels of good cholesterol (HDL cholesterol). HDL cholesterol is the type of cholesterol that protects against heart disease. High levels of HDL remove the LDL buildup from arteries.  High levels of triglycerides. Triglycerides are a fatty substance in the blood that is linked to a buildup of plaques in the arteries.  You can develop dyslipidemia because of the genes you are born with (primary dyslipidemia) or changes that occur during your life (secondary dyslipidemia), or as a side effect of certain medical treatments. What are the causes? Primary dyslipidemia is caused by changes (mutations) in genes that are passed down through families (inherited). These mutations cause several types of dyslipidemia. Mutations can result in disorders that make the body produce too much LDL cholesterol or triglycerides, or not enough HDL cholesterol. These disorders may lead to heart disease, arterial disease, or stroke at an early age. Causes of secondary dyslipidemia include certain lifestyle choices and diseases that lead to dyslipidemia, such as:  Eating a diet that is high in animal fat.  Not getting enough activity or exercise (having a sedentary lifestyle).  Having diabetes, kidney disease, liver disease, or thyroid disease.  Drinking large amounts of alcohol.  Using certain types of drugs.  What increases the risk? You may be at greater risk  for dyslipidemia if you are an older man or if you are a woman who has gone through menopause. Other risk factors include:  Having a family history of dyslipidemia.  Taking certain medicines, including birth control pills, steroids, some diuretics, beta-blockers, and some medicines forHIV.  Smoking cigarettes.  Eating a high-fat diet.  Drinking large amounts of alcohol.  Having certain medical conditions such as diabetes, polycystic ovary syndrome (PCOS), pregnancy, kidney disease, liver disease, or hypothyroidism.  Not exercising regularly.  Being overweight or obese with too much belly fat.  What are the signs or symptoms? Dyslipidemia does not usually cause any symptoms. Very high lipid levels can cause fatty bumps under the skin (xanthomas) or a white or gray ring around the black center (pupil) of the eye. Very high triglyceride levels can cause inflammation of the pancreas (pancreatitis). How is this diagnosed? Your health care provider may diagnose dyslipidemia based on a routine blood test (fasting blood test). Because most people do not have symptoms of the condition, this blood testing (lipid profile) is done on adults age 38 and older and is repeated every 5 years. This test checks:  Total cholesterol. This is a measure of the total amount of cholesterol in your blood, including LDL cholesterol, HDL cholesterol, and triglycerides. A healthy number is below 200.  LDL cholesterol. The target number for LDL cholesterol is different for each person, depending on individual risk factors. For most people, a number below 100 is healthy. Ask your health care provider what your LDL cholesterol number should be.  HDL cholesterol. An HDL level of 60 or higher is best because it helps to protect against heart disease. A number below 40 for  men or below 50 for women increases the risk for heart disease.  Triglycerides. A healthy triglyceride number is below 150.  If your lipid profile  is abnormal, your health care provider may do other blood tests to get more information about your condition. How is this treated? Treatment depends on the type of dyslipidemia that you have and your other risk factors for heart disease and stroke. Your health care provider will have a target range for your lipid levels based on this information. For many people, treatment starts with lifestyle changes, such as diet and exercise. Your health care provider may recommend that you:  Get regular exercise.  Make changes to your diet.  Quit smoking if you smoke.  If diet changes and exercise do not help you reach your goals, your health care provider may also prescribe medicine to lower lipids. The most commonly prescribed type of medicine lowers your LDL cholesterol (statin drug). If you have a high triglyceride level, your provider may prescribe another type of drug (fibrate) or an omega-3 fish oil supplement, or both. Follow these instructions at home:  Take over-the-counter and prescription medicines only as told by your health care provider. This includes supplements.  Get regular exercise. Start an aerobic exercise and strength training program as told by your health care provider. Ask your health care provider what activities are safe for you. Your health care provider may recommend: ? 30 minutes of aerobic activity 4-6 days a week. Brisk walking is an example of aerobic activity. ? Strength training 2 days a week.  Eat a healthy diet as told by your health care provider. This can help you reach and maintain a healthy weight, lower your LDL cholesterol, and raise your HDL cholesterol. It may help to work with a diet and nutrition specialist (dietitian) to make a plan that is right for you. Your dietitian or health care provider may recommend: ? Limiting your calories, if you are overweight. ? Eating more fruits, vegetables, whole grains, fish, and lean meats. ? Limiting saturated fat, trans  fat, and cholesterol.  Follow instructions from your health care provider or dietitian about eating or drinking restrictions.  Limit alcohol intake to no more than one drink per day for nonpregnant women and two drinks per day for men. One drink equals 12 oz of beer, 5 oz of wine, or 1 oz of hard liquor.  Do not use any products that contain nicotine or tobacco, such as cigarettes and e-cigarettes. If you need help quitting, ask your health care provider.  Keep all follow-up visits as told by your health care provider. This is important. Contact a health care provider if:  You are having trouble sticking to your exercise or diet plan.  You are struggling to quit smoking or control your use of alcohol. Summary  Dyslipidemia is an imbalance of waxy, fat-like substances (lipids) in the blood. The body needs lipids in small amounts. Dyslipidemia often involves a high level of cholesterol or triglycerides, which are types of lipids.  Treatment depends on the type of dyslipidemia that you have and your other risk factors for heart disease and stroke.  For many people, treatment starts with lifestyle changes, such as diet and exercise. Your health care provider may also prescribe medicine to lower lipids. This information is not intended to replace advice given to you by your health care provider. Make sure you discuss any questions you have with your health care provider. Document Released: 03/15/2013 Document Revised: 11/05/2015 Document Reviewed:  11/05/2015 Elsevier Interactive Patient Education  2018 [employer]vier Inc.  Heart Disease Prevention Heart disease is a leading cause of death. There are many things you can do to help prevent heart disease. Be physically active Physical activity is good for your heart. It helps control your blood pressure, cholesterol levels, and weight. Try to be physically active every day. Ask your health care provider what activities are best for you. Be a healthy  weight Extra weight can strain your heart and affect your blood pressure and cholesterol levels. Lose weight with diet and exercise if recommended by your health care provider. Eat heart-healthy foods Follow a healthy eating plan as recommended by your health care provider or dietitian. Heart-healthy foods include:  High-fiber foods. These include oat bran, oatmeal, and whole-grain breads and cereals.  Fruits and vegetables.  Avoid:  Alcohol.  Fried foods.  Foods high in saturated fat. These include meats, butter, whole dairy products, shortening, and coconut or palm oil.  Salty foods. These include canned food, luncheon meat, salty snacks, and fast food.  Keep your cholesterol levels under control Cholesterol is a substance that is used for many important functions. When your cholesterol levels are high, cholesterol can stick to the insides of your blood vessels, making them narrow or clog. This can lead to chest pain (angina) and a heart attack. Keep your cholesterol levels under control as recommended by your health care provider. Have your cholesterol checked at least once a year. Target cholesterol levels (in mg/dL) for most people are:  Total cholesterol below 200.  LDL cholesterol below 100.  HDL cholesterol above 40 in men and above 50 in women.  Triglycerides below 150.  Keep your blood pressure under control Having high blood pressure (hypertension) puts you at risk for stroke and other forms of heart disease. Keep your blood pressure under control as recommended by your health care provider. Ask your health care provider if you need treatment to lower your blood pressure. If you are 55-86 years of age, have your blood pressure checked every 3-5 years. If you are 34 years of age or older, have your blood pressure checked every year. Do not use tobacco products Tobacco smoke can damage your heart and blood vessels. Do not use any tobacco products including cigarettes,  chewing tobacco, or electronic cigarettes. If you need help quitting, ask your health care provider. Take medicines as directed Take medicines only as directed by your health care provider. Ask your health care provider whether you should take an aspirin every day. Taking aspirin can help reduce your risk of heart disease and stroke. Where to find more information: To find out more about heart disease, visit the American Heart Association's website at www.americanheart.org This information is not intended to replace advice given to you by your health care provider. Make sure you discuss any questions you have with your health care provider. Document Released: 10/23/2003 Document Revised: 08/08/2015 Document Reviewed: 05/04/2013 Elsevier Interactive Patient Education  2018 Elsevier Inc.  Sinus Rinse What is a sinus rinse? A sinus rinse is a home treatment. It rinses your sinuses with a mixture of salt and water (saline solution). Sinuses are air-filled spaces in your skull behind the bones of your face and forehead. They open into your nasal cavity. To do a sinus rinse, you will need:  Saline solution.  Neti pot or spray bottle. This releases the saline solution into your nose and through your sinuses. You can buy neti pots and spray bottles at: ?  Your local pharmacy. ? A health food store. ? Online.  When should I do a sinus rinse? A sinus rinse can help to clear your nasal cavity. It can clear:  Mucus.  Dirt.  Dust.  Pollen.  You may do a sinus rinse when you have:  A cold.  A virus.  Allergies.  A sinus infection.  A stuffy nose.  If you are considering a sinus rinse:  Ask your child's doctor before doing a sinus rinse on your child.  Do not do a sinus rinse if you have had: ? Ear or nasal surgery. ? An ear infection. ? Blocked ears.  How do I do a sinus rinse?  Wash your hands.  Disinfect your device using the directions that came with the device.  Dry  your device.  Use the solution that comes with your device or one that is sold separately in stores. Follow the mixing directions on the package.  Fill your device with the amount of saline solution as stated in the device instructions.  Stand over a sink and tilt your head sideways over the sink.  Place the spout of the device in your upper nostril (the one closer to the ceiling).  Gently pour or squeeze the saline solution into the nasal cavity. The liquid should drain to the lower nostril if you are not too congested.  Gently blow your nose. Blowing too hard may cause ear pain.  Repeat in the other nostril.  Clean and rinse your device with clean water.  Air-dry your device. Are there risks of a sinus rinse? Sinus rinse is normally very safe and helpful. However, there are a few risks, which include:  A burning feeling in the sinuses. This may happen if you do not make the saline solution as instructed. Make sure to follow all directions when making the saline solution.  Infection from unclean water. This is rare, but possible.  Nasal irritation.  This information is not intended to replace advice given to you by your health care provider. Make sure you discuss any questions you have with your health care provider. Document Released: 10/05/2013 Document Revised: 02/05/2016 Document Reviewed: 07/26/2013 Elsevier Interactive Patient Education  2017 [employer]

## 2017-05-19 LAB — THYROID PANEL WITH TSH
FREE THYROXINE INDEX: 0.8 — AB (ref 1.2–4.9)
T3 Uptake Ratio: 18 % — ABNORMAL LOW (ref 24–39)
T4, Total: 4.3 ug/dL — ABNORMAL LOW (ref 4.5–12.0)
TSH: 75.24 u[IU]/mL — ABNORMAL HIGH (ref 0.450–4.500)

## 2017-05-19 LAB — BASIC METABOLIC PANEL
BUN/Creatinine Ratio: 14 (ref 9–20)
BUN: 16 mg/dL (ref 6–24)
CALCIUM: 8.9 mg/dL (ref 8.7–10.2)
CO2: 25 mmol/L (ref 20–29)
Chloride: 102 mmol/L (ref 96–106)
Creatinine, Ser: 1.14 mg/dL (ref 0.76–1.27)
GFR calc Af Amer: 91 mL/min/{1.73_m2} (ref >59)
GFR, EST NON AFRICAN AMERICAN: 79 mL/min/{1.73_m2} (ref >59)
Glucose: 77 mg/dL (ref 65–99)
POTASSIUM: 4.2 mmol/L (ref 3.5–5.2)
Sodium: 138 mmol/L (ref 134–144)

## 2017-05-19 LAB — LIPID PANEL
CHOL/HDL RATIO: 3.9 ratio (ref 0.0–5.0)
Cholesterol, Total: 175 mg/dL (ref 100–199)
HDL: 45 mg/dL (ref >39)
LDL Calculated: 115 mg/dL — ABNORMAL HIGH (ref 0–99)
TRIGLYCERIDES: 74 mg/dL (ref 0–149)
VLDL Cholesterol Cal: 15 mg/dL (ref 5–40)

## 2017-05-19 LAB — HIV ANTIBODY (ROUTINE TESTING W REFLEX): HIV SCREEN 4TH GENERATION: NONREACTIVE

## 2017-05-24 ENCOUNTER — Other Ambulatory Visit: Payer: Self-pay | Admitting: Nurse Practitioner

## 2017-05-24 MED ORDER — LEVOTHYROXINE SODIUM 88 MCG PO TABS: 88.0000 ug | ORAL_TABLET | Freq: Every day | ORAL | 2 refills | Status: DC

## 2017-05-25 ENCOUNTER — Ambulatory Visit: Payer: Medicaid Other | Attending: Nurse Practitioner

## 2017-05-25 ENCOUNTER — Ambulatory Visit: Payer: Self-pay | Attending: Nurse Practitioner | Admitting: *Deleted

## 2017-05-25 VITALS — BP 129/89 | HR 67

## 2017-05-25 DIAGNOSIS — I10 Essential (primary) hypertension: Secondary | ICD-10-CM

## 2017-05-25 DIAGNOSIS — Z0130 Encounter for examination of blood pressure without abnormal findings: Secondary | ICD-10-CM | POA: Insufficient documentation

## 2017-05-25 NOTE — Progress Notes (Signed)
[facility] alert and oriented and arrives in good spirits. Pt is here for blood pressure check. At last OV on 05/18/2017 with his PCP, pt medication was changed to Amlodipine.    Pt denies chest pain, SOB, HA, dizziness, or blurred vision, BLE edema.  Verified medication with patient. He states as of today, he has not taken medication.  Pt instructed to return in 2 weeks for nurse visit while taking medication.  Pt verbalized understanding. Appointment scheduled for 06/08/2016 at 10:30.  Blood pressure reading: 131/89 and  121/89

## 2017-05-26 ENCOUNTER — Telehealth: Payer: Self-pay

## 2017-05-26 NOTE — Telephone Encounter (Signed)
CMA spoke to patient to inform on lab results and PCP advising.  Patient understood and no questions.  

## 2017-05-26 NOTE — Telephone Encounter (Signed)
-----   Message from Claiborne RiggZelda W Fleming, NP sent at 05/24/2017  7:33 PM EST ----- Thyroid levels are extremely abnormal. Will increase your synthroid. Please make sure you take synthroid at least 30 minutes before you take any of your other medications for better effectiveness. Please make a lab appointment for 6-8 weeks to repeat your thyroid levels.

## 2017-06-15 ENCOUNTER — Ambulatory Visit: Payer: Self-pay | Attending: Nurse Practitioner | Admitting: *Deleted

## 2017-06-15 VITALS — BP 125/84 | HR 70 | Resp 16

## 2017-06-15 DIAGNOSIS — I10 Essential (primary) hypertension: Secondary | ICD-10-CM

## 2017-06-15 NOTE — Progress Notes (Signed)
[facility] for [facility]. Pt alert and oriented and arrives in good spirits. At last OV on 05/18/2017  with PCP pt was off medication for several weeks.  Pt denies SOB, HA, dizziness, or blurred vision, or BLE edema.  He denies pain in chest presently however he  States he continues to notice intermittent chest tightness at night once he is relaxed. Denies SOB, dizziness, or pain with the episodes.   Verified medication with patient. He has some relief with muscle relaxer but does not take it often. Pt states medication was taken this morning.  Blood pressure reading: 125/84  Pulse: 70

## 2017-07-22 MED FILL — AMLODIPINE BESYLATE 10 MG T: 10 | 30 days supply | Qty: 30 | Fill #1

## 2017-08-21 ENCOUNTER — Other Ambulatory Visit: Payer: Self-pay | Admitting: Nurse Practitioner

## 2017-09-03 MED FILL — AMLODIPINE BESYLATE 10 MG T: 10 | 30 days supply | Qty: 30 | Fill #2

## 2017-09-26 ENCOUNTER — Other Ambulatory Visit: Payer: Self-pay | Admitting: Nurse Practitioner

## 2017-09-28 ENCOUNTER — Encounter: Payer: Self-pay | Admitting: Nurse Practitioner

## 2017-09-28 ENCOUNTER — Ambulatory Visit: Payer: Self-pay | Attending: Nurse Practitioner | Admitting: Nurse Practitioner

## 2017-09-28 VITALS — BP 116/76 | HR 59 | Temp 98.3°F | Ht 69.0 in | Wt 234.0 lb

## 2017-09-28 DIAGNOSIS — Z79890 Hormone replacement therapy (postmenopausal): Secondary | ICD-10-CM | POA: Insufficient documentation

## 2017-09-28 DIAGNOSIS — E7800 Pure hypercholesterolemia, unspecified: Secondary | ICD-10-CM | POA: Insufficient documentation

## 2017-09-28 DIAGNOSIS — R079 Chest pain, unspecified: Secondary | ICD-10-CM | POA: Insufficient documentation

## 2017-09-28 DIAGNOSIS — E039 Hypothyroidism, unspecified: Secondary | ICD-10-CM | POA: Insufficient documentation

## 2017-09-28 DIAGNOSIS — M25512 Pain in left shoulder: Secondary | ICD-10-CM | POA: Insufficient documentation

## 2017-09-28 DIAGNOSIS — I10 Essential (primary) hypertension: Secondary | ICD-10-CM | POA: Insufficient documentation

## 2017-09-28 DIAGNOSIS — Z808 Family history of malignant neoplasm of other organs or systems: Secondary | ICD-10-CM | POA: Insufficient documentation

## 2017-09-28 DIAGNOSIS — Z79899 Other long term (current) drug therapy: Secondary | ICD-10-CM | POA: Insufficient documentation

## 2017-09-28 DIAGNOSIS — E0500 Thyrotoxicosis with diffuse goiter without thyrotoxic crisis or storm: Secondary | ICD-10-CM | POA: Insufficient documentation

## 2017-09-28 MED ORDER — IBUPROFEN 800 MG PO TABS: 800.0000 mg | ORAL_TABLET | Freq: Three times a day (TID) | ORAL | 1 refills | Status: DC | PRN

## 2017-09-28 MED ORDER — AMLODIPINE BESYLATE 5 MG PO TABS: 5.0000 mg | ORAL_TABLET | Freq: Every day | ORAL | 0 refills | Status: DC

## 2017-09-28 MED FILL — AMLODIPINE BESYLATE 10 MG T: 10 | 30 days supply | Qty: 30 | Fill #3

## 2017-09-28 MED FILL — IBUPROFEN 800 MG TABLET: 800 | 20 days supply | Qty: 60 | Fill #0

## 2017-09-28 NOTE — Progress Notes (Signed)
Assessment & Plan:  Larry Thomas was seen today for left shoulder pain  Diagnoses and all orders for this visit:  Essential hypertension -   amlODipine (NORVASC) 5 MG tablet; Take 1 tablet (5 mg total) by mouth daily. Continue all antihypertensives as prescribed.  Remember to bring in your blood pressure log with you for your follow up appointment.  DASH/Mediterranean Diets are healthier choices for HTN.    Hypothyroidism, unspecified type -     Thyroid Panel With TSH Will refill synthroid based on lab results  Acute pain of left shoulder -     ibuprofen (ADVIL,MOTRIN) 800 MG tablet; Take 1 tablet (800 mg total) by mouth every 8 (eight) hours as needed. May alternate with acetaminophen for pain.  May also alternate with heat and ice application to affected area.   Patient has been counseled on age-appropriate routine health concerns for screening and prevention. These are reviewed and up-to-date. Referrals have been placed accordingly. Immunizations are up-to-date or declined.    Subjective:   Chief Complaint  Patient presents with  . Chest Pain    Pt. stated he is having a little chest pain, tightness and his left arm feels weak.    HPI Larry Thomas 42 y.o. male presents to office today for follow up to HTN and Hypothyroidism. He has complaints of left shoulder pain as well today.   Left Shoulder Pain Onset 3 weeks. Pain occurs intermittently and lasts up to 10 minutes a few times per week. Has tried Aspirin which causes pain to be relieved but then returns. Aggravating factors: none. Relieving factors: None. Describes pain as stinging and throbbing. Other symptoms include left hand weakness intermittently when attempting to grab objects. He does not have limited ROM.   Hypothyroidism: Patient presents for evaluation of thyroid function. Symptoms consist of palpitations and intermittent chest pain (most recent EKG performed in this office a few months ago was normal). Symptoms  have present for several months. The symptoms are off and on and intermittent.  The problem has been unchanged.  Previous thyroid studies include TSH, triiodothyronine total and free thyroxine. The hypothyroidism is due to hypothyroidism and Grave's disease. Patient was supposed to return in 4 weeks for lab studies 4 months ago however he did not make a lab appointment and today is the first time he is having his thyroid levels rechecked. Lab Results  Component Value Date   TSH 75.240 (H) 05/18/2017    CHRONIC HYPERTENSION Disease Monitoring  Blood pressure range:  BP Readings from Last 3 Encounters:  09/28/17 116/76  06/15/17 125/84  05/25/17 129/89   Chest pain: yes intermittently  Dyspnea: yes, intermittently with exertion   Claudication: no  Medication compliance: yes; taking amlodipine 5mg  daily Medication Side Effects  Lightheadedness: no   Urinary frequency: no   Edema: no   Impotence: no  Preventitive Healthcare:  Exercise: no   Diet Pattern: diet: general  Salt Restriction:  no    Review of Systems  Constitutional: Positive for malaise/fatigue. Negative for fever and weight loss.  HENT: Negative.  Negative for nosebleeds.   Eyes: Negative.  Negative for blurred vision, double vision and photophobia.  Respiratory: Positive for shortness of breath. Negative for cough.   Cardiovascular: Positive for chest pain and palpitations. Negative for leg swelling.  Gastrointestinal: Negative.  Negative for heartburn, nausea and vomiting.  Musculoskeletal: Negative.  Negative for myalgias.  Neurological: Negative.  Negative for dizziness, focal weakness, seizures and headaches.  Psychiatric/Behavioral: Negative.  Negative  for suicidal ideas.    Past Medical History:  Diagnosis Date  . Hypercholesterolemia   . Hypertension   . Hypothyroidism   . Thyroid disease     Past Surgical History:  Procedure Laterality Date  . BILATERAL CARPAL TUNNEL RELEASE Bilateral 04/12/2015    Procedure: BILATERAL CARPAL TUNNEL RELEASE;  Surgeon: Betha LoaKevin Kuzma, MD;  Location: [facility];  Service: Orthopedics;  Laterality: Bilateral;    Family History  Problem Relation Age of Onset  . Thyroid cancer Mother     Social History Reviewed with no changes to be made today.   Outpatient Medications Prior to Visit  Medication Sig Dispense Refill  . fluticasone (FLONASE) 50 MCG/ACT nasal spray Place 1 spray into both nostrils daily. 16 g 6  . Multiple Vitamins-Minerals (MULTIVITAMIN & MINERAL PO) Take 1 tablet by mouth daily.    Marland Kitchen. amLODipine (NORVASC) 5 MG tablet Take 1 tablet (5 mg total) by mouth daily. 30 tablet 0  . levothyroxine (SYNTHROID, LEVOTHROID) 88 MCG tablet Take 1 tablet (88 mcg total) by mouth daily before breakfast. 30 tablet 2  . cyclobenzaprine (FLEXERIL) 10 MG tablet Take 1 tablet (10 mg total) by mouth 3 (three) times daily. (Patient not taking: Reported on 09/28/2017) 30 tablet 1   No facility-administered medications prior to visit.     No Known Allergies     Objective:    BP 116/76 (BP Location: Left Arm, Patient Position: Sitting, Cuff Size: Large)   Pulse (!) 59   Temp 98.3 F (36.8 C) (Oral)   Ht 5\' 9"  (1.753 m)   Wt 234 lb (106.1 kg)   SpO2 100%   BMI 34.56 kg/m   Wt Readings from Last 3 Encounters:  09/28/17 234 lb (106.1 kg)  05/18/17 239 lb (108.4 kg)  04/12/15 231 lb 6 oz (105 kg)    Physical Exam  Constitutional: He is oriented to person, place, and time. He appears well-developed and well-nourished. He is cooperative.  HENT:  Head: Normocephalic and atraumatic.  Eyes: EOM are normal.  Neck: Normal range of motion.  Cardiovascular: Regular rhythm, normal heart sounds and intact distal pulses. Bradycardia present. Exam reveals no gallop and no friction rub.  No murmur heard. Pulmonary/Chest: Effort normal and breath sounds normal. No tachypnea. No respiratory distress. He has no decreased breath sounds. He has no wheezes. He  has no rhonchi. He has no rales. He exhibits no tenderness.  Abdominal: Soft. Bowel sounds are normal.  Musculoskeletal: Normal range of motion. He exhibits no edema.  Neurological: He is alert and oriented to person, place, and time. Coordination normal.  Skin: Skin is warm and dry.  Psychiatric: He has a normal mood and affect. His behavior is normal. Judgment and thought content normal.  Nursing note and vitals reviewed.      Patient has been counseled extensively about nutrition and exercise as well as the importance of adherence with medications and regular follow-up. The patient was given clear instructions to go to ER or return to medical center if symptoms don't improve, worsen or new problems develop. The patient verbalized understanding.   Follow-up: Return in about 1 month (around 10/26/2017) for left arm pain; thyroid .   Claiborne RiggZelda W Railynn Ballo, FNP-BC [facility] [place], [place] [phone]   09/28/2017, 5:39 PM

## 2017-09-29 ENCOUNTER — Other Ambulatory Visit: Payer: Self-pay | Admitting: Nurse Practitioner

## 2017-09-29 LAB — THYROID PANEL WITH TSH
FREE THYROXINE INDEX: 0.6 — AB (ref 1.2–4.9)
T3 Uptake Ratio: 19 % — ABNORMAL LOW (ref 24–39)
T4, Total: 3.4 ug/dL — ABNORMAL LOW (ref 4.5–12.0)
TSH: 88.14 u[IU]/mL — ABNORMAL HIGH (ref 0.450–4.500)

## 2017-09-29 MED ORDER — LEVOTHYROXINE SODIUM 112 MCG PO TABS: 112.0000 ug | ORAL_TABLET | Freq: Every day | ORAL | 1 refills | Status: DC

## 2017-09-30 MED FILL — LEVOTHYROXINE 112 MCG TAB: 112 | 30 days supply | Qty: 30 | Fill #0

## 2017-10-02 ENCOUNTER — Telehealth: Payer: Self-pay

## 2017-10-02 NOTE — Telephone Encounter (Signed)
CMA attempt to call patient to inform on lab results.  No answer and left a VM for patient to call back.  If patient call back, please inform:  Thyroid levels have worsened. I have sent a new script for synthroid to the pharmacy. Will recheck your thyroid levels at your next office visit.

## 2017-10-02 NOTE — Telephone Encounter (Signed)
-----   Message from Claiborne RiggZelda W Fleming, NP sent at 09/29/2017  7:09 PM EDT ----- Thyroid levels have worsened. I have sent a new script for synthroid to the pharmacy. Will recheck your thyroid levels at your next office visit.

## 2017-10-30 ENCOUNTER — Ambulatory Visit: Payer: Self-pay | Attending: Nurse Practitioner | Admitting: Nurse Practitioner

## 2017-10-30 ENCOUNTER — Encounter: Payer: Self-pay | Admitting: Nurse Practitioner

## 2017-10-30 VITALS — BP 113/74 | HR 59 | Temp 98.1°F | Ht 69.0 in | Wt 223.0 lb

## 2017-10-30 DIAGNOSIS — Z79899 Other long term (current) drug therapy: Secondary | ICD-10-CM | POA: Insufficient documentation

## 2017-10-30 DIAGNOSIS — R946 Abnormal results of thyroid function studies: Secondary | ICD-10-CM | POA: Insufficient documentation

## 2017-10-30 DIAGNOSIS — I10 Essential (primary) hypertension: Secondary | ICD-10-CM | POA: Insufficient documentation

## 2017-10-30 DIAGNOSIS — Z79890 Hormone replacement therapy (postmenopausal): Secondary | ICD-10-CM | POA: Insufficient documentation

## 2017-10-30 DIAGNOSIS — E039 Hypothyroidism, unspecified: Secondary | ICD-10-CM | POA: Insufficient documentation

## 2017-10-30 NOTE — Progress Notes (Signed)
Assessment & Plan:  Larry Thomas was seen today for follow-up.  Diagnoses and all orders for this visit:  Hypothyroidism, unspecified type -     TSH Last TSH elevated. Will adjust synthroid based on lab results obtained from blood work today. Awaiting results.   Essential hypertension Continue amlodipine as prescribed.  Remember to bring in your blood pressure log with you for your follow up appointment.  DASH/Mediterranean Diets are healthier choices for HTN.    Patient has been counseled on age-appropriate routine health concerns for screening and prevention. These are reviewed and up-to-date. Referrals have been placed accordingly. Immunizations are up-to-date or declined.    Subjective:   Chief Complaint  Patient presents with  . Follow-up    Pt. is here for follow-up on thyroid and arm pain. Pt. stated his arm pain comes and go.    HPI Larry GaleMichael A Thomas 42 y.o. male presents to office today for follow up to hypothyroidism.   Hypothyroidism Patient presents for evaluation of thyroid function. Symptoms consist of fatigue. Symptoms have present for several months. The symptoms are mild.  The problem has been stable.  Previous thyroid studies include TSH. The hypothyroidism is due to Grave's disease and status-post I-131 treatment. He currently endorses medication compliance taking synthroid 112mcg daily.  Lab Results  Component Value Date   TSH 88.140 (H) 09/28/2017   CHRONIC HYPERTENSION Disease Monitoring  Blood pressure range BP Readings from Last 3 Encounters:  10/30/17 113/74  09/28/17 116/76  06/15/17 125/84    Chest pain: no   Dyspnea: no   Claudication: no  Medication compliance: yes, taking amlodipine 5 mg as prescribed  Medication Side Effects  Lightheadedness: no   Urinary frequency: no   Edema: no   Impotence: no  Preventitive Healthcare:  Exercise: yes   Diet Pattern: diet: general  Salt Restriction:  no   Review of Systems  Constitutional:  Negative for fever, malaise/fatigue and weight loss.  HENT: Negative.  Negative for nosebleeds.   Eyes: Negative.  Negative for blurred vision, double vision and photophobia.  Respiratory: Negative.  Negative for cough and shortness of breath.   Cardiovascular: Negative.  Negative for chest pain, palpitations and leg swelling.  Gastrointestinal: Negative.  Negative for heartburn, nausea and vomiting.  Musculoskeletal: Negative.  Negative for myalgias.  Neurological: Negative.  Negative for dizziness, focal weakness, seizures and headaches.  Psychiatric/Behavioral: Negative.  Negative for suicidal ideas.    Past Medical History:  Diagnosis Date  . Hypercholesterolemia   . Hypertension   . Hypothyroidism   . Thyroid disease     Past Surgical History:  Procedure Laterality Date  . BILATERAL CARPAL TUNNEL RELEASE Bilateral 04/12/2015   Procedure: BILATERAL CARPAL TUNNEL RELEASE;  Surgeon: Betha LoaKevin Kuzma, MD;  Location: [facility];  Service: Orthopedics;  Laterality: Bilateral;    Family History  Problem Relation Age of Onset  . Thyroid cancer Mother     Social History Reviewed with no changes to be made today.   Outpatient Medications Prior to Visit  Medication Sig Dispense Refill  . amLODipine (NORVASC) 5 MG tablet Take 1 tablet (5 mg total) by mouth daily. 90 tablet 0  . fluticasone (FLONASE) 50 MCG/ACT nasal spray Place 1 spray into both nostrils daily. 16 g 6  . ibuprofen (ADVIL,MOTRIN) 800 MG tablet Take 1 tablet (800 mg total) by mouth every 8 (eight) hours as needed. 60 tablet 1  . levothyroxine (SYNTHROID, LEVOTHROID) 112 MCG tablet Take 1 tablet (112 mcg total)  by mouth daily. 30 tablet 1  . Multiple Vitamins-Minerals (MULTIVITAMIN & MINERAL PO) Take 1 tablet by mouth daily.    . cyclobenzaprine (FLEXERIL) 10 MG tablet Take 1 tablet (10 mg total) by mouth 3 (three) times daily. (Patient not taking: Reported on 09/28/2017) 30 tablet 1   No facility-administered  medications prior to visit.     No Known Allergies     Objective:    BP 113/74 (BP Location: Left Arm, Patient Position: Sitting, Cuff Size: Large)   Pulse (!) 59   Temp 98.1 F (36.7 C) (Oral)   Ht 5\' 9"  (1.753 m)   Wt 223 lb (101.2 kg)   SpO2 100%   BMI 32.93 kg/m  Wt Readings from Last 3 Encounters:  10/30/17 223 lb (101.2 kg)  09/28/17 234 lb (106.1 kg)  05/18/17 239 lb (108.4 kg)    Physical Exam  Constitutional: He is oriented to person, place, and time. He appears well-developed and well-nourished. He is cooperative.  HENT:  Head: Normocephalic and atraumatic.  Eyes: EOM are normal.  Neck: Normal range of motion.  Cardiovascular: Regular rhythm and normal heart sounds. Bradycardia present. Exam reveals no gallop and no friction rub.  No murmur heard. Pulmonary/Chest: Effort normal and breath sounds normal. No tachypnea. No respiratory distress. He has no decreased breath sounds. He has no wheezes. He has no rhonchi. He has no rales. He exhibits no tenderness.  Abdominal: Bowel sounds are normal.  Musculoskeletal: Normal range of motion. He exhibits no edema.  Neurological: He is alert and oriented to person, place, and time. Coordination normal.  Skin: Skin is warm and dry.  Psychiatric: He has a normal mood and affect. His behavior is normal. Judgment and thought content normal.  Nursing note and vitals reviewed.        Patient has been counseled extensively about nutrition and exercise as well as the importance of adherence with medications and regular follow-up. The patient was given clear instructions to go to ER or return to medical center if symptoms don't improve, worsen or new problems develop. The patient verbalized understanding.   Follow-up: Return in about 3 months (around 01/30/2018) for thyroid disorder.   Claiborne Rigg, FNP-BC [facility] [place], [place] [phone]   10/30/2017, 1:29 PM

## 2017-10-30 NOTE — Patient Instructions (Signed)
Hypothyroidism Hypothyroidism is a disorder of the thyroid. The thyroid is a large gland that is located in the lower front of the neck. The thyroid releases hormones that control how the body works. With hypothyroidism, the thyroid does not make enough of these hormones. What are the causes? Causes of hypothyroidism may include:  Viral infections.  Pregnancy.  Your own defense system (immune system) attacking your thyroid.  Certain medicines.  Birth defects.  Past radiation treatments to your head or neck.  Past treatment with radioactive iodine.  Past surgical removal of part or all of your thyroid.  Problems with the gland that is located in the center of your brain (pituitary).  What are the signs or symptoms? Signs and symptoms of hypothyroidism may include:  Feeling as though you have no energy (lethargy).  Inability to tolerate cold.  Weight gain that is not explained by a change in diet or exercise habits.  Dry skin.  Coarse hair.  Menstrual irregularity.  Slowing of thought processes.  Constipation.  Sadness or depression.  How is this diagnosed? Your health care provider may diagnose hypothyroidism with blood tests and ultrasound tests. How is this treated? Hypothyroidism is treated with medicine that replaces the hormones that your body does not make. After you begin treatment, it may take several weeks for symptoms to go away. Follow these instructions at home:  Take medicines only as directed by your health care provider.  If you start taking any new medicines, tell your health care provider.  Keep all follow-up visits as directed by your health care provider. This is important. As your condition improves, your dosage needs may change. You will need to have blood tests regularly so that your health care provider can watch your condition. Contact a health care provider if:  Your symptoms do not get better with treatment.  You are taking thyroid  replacement medicine and: ? You sweat excessively. ? You have tremors. ? You feel anxious. ? You lose weight rapidly. ? You cannot tolerate heat. ? You have emotional swings. ? You have diarrhea. ? You feel weak. Get help right away if:  You develop chest pain.  You develop an irregular heartbeat.  You develop a rapid heartbeat. This information is not intended to replace advice given to you by your health care provider. Make sure you discuss any questions you have with your health care provider. Document Released: 03/10/2005 Document Revised: 08/16/2015 Document Reviewed: 07/26/2013 Elsevier Interactive Patient Education  2018 [employer] Antithyroid Peroxidase Antibody Test Why am I having this test? This test is used for diagnosing different thyroid diseases. Your health care provider may perform this test along with other thyroid antibody tests to aid in specific diagnoses. What kind of sample is taken? A blood sample is required for this test. It is usually collected by inserting a needle into a vein. How do I prepare for this test? There is no preparation required for this test. What are the reference ranges? Reference rangesare considered healthy rangesestablished after testing a large group of healthy people. Reference rangesmay vary among different people, labs, and hospitals. It is your responsibility to obtain your test results. Ask the lab or department performing the test when and how you will get your results. Reference ranges are as follows:  Less than 9 international units/mL for all ages.  What do the results mean? Increased levels of antithyroid peroxidase antibody may indicate:  Hashimoto thyroiditis.  Rheumatoid arthritis (RA).  Hypothyroidism.  Thyroid cancer.  Talk  with your health care provider to discuss your results, treatment options, and if necessary, the need for more tests. Talk with your health care provider if you have any questions  about your results. Talk with your health care provider to discuss your results, treatment options, and if necessary, the need for more tests. Talk with your health care provider if you have any questions about your results. This information is not intended to replace advice given to you by your health care provider. Make sure you discuss any questions you have with your health care provider. Document Released: 04/03/2004 Document Revised: 11/12/2015 Document Reviewed: 08/31/2013 Elsevier Interactive Patient Education  [employer].

## 2017-10-31 LAB — TSH: TSH: 57.49 u[IU]/mL — AB (ref 0.450–4.500)

## 2017-11-04 ENCOUNTER — Telehealth: Payer: Self-pay | Admitting: Nurse Practitioner

## 2017-11-04 NOTE — Telephone Encounter (Signed)
Please contact patient with lab results completed on 10/30/2017, when they have been released

## 2017-11-05 ENCOUNTER — Other Ambulatory Visit: Payer: Self-pay | Admitting: Nurse Practitioner

## 2017-11-05 MED ORDER — LEVOTHYROXINE SODIUM 137 MCG PO TABS: 137.0000 ug | ORAL_TABLET | Freq: Every day | ORAL | 1 refills | Status: DC

## 2017-11-05 MED FILL — ?LEVOTHYROXINE 137 MCG TAB: 137 | 30 days supply | Qty: 30 | Fill #0

## 2017-11-05 NOTE — Telephone Encounter (Signed)
-----   Message from Claiborne RiggZelda W Fleming, NP sent at 11/05/2017  2:51 AM EDT ----- Please repeat lab work in 6-8 weeks

## 2017-11-05 NOTE — Telephone Encounter (Signed)
CMA spoke to patient to inform on lab results.  Patient verified DOB. Patient understood new Rx was sent.  Patient scheduled a Thyroid-ReCheck 12/17/2017.

## 2017-11-05 NOTE — Telephone Encounter (Signed)
-----   Message from Claiborne RiggZelda W Fleming, NP sent at 11/05/2017  2:48 AM EDT ----- Thyroid levels have improved but still elevated. Will increase thyroid medication and send to pharmacy.

## 2017-11-10 ENCOUNTER — Other Ambulatory Visit: Payer: Self-pay | Admitting: Nurse Practitioner

## 2017-11-10 DIAGNOSIS — E039 Hypothyroidism, unspecified: Secondary | ICD-10-CM

## 2017-12-01 MED FILL — AMLODIPINE BESYLATE 10 MG T: 10 | 30 days supply | Qty: 30 | Fill #4

## 2017-12-01 MED FILL — ?LEVOTHYROXINE 137 MCG TAB: 137 | 30 days supply | Qty: 30 | Fill #1

## 2017-12-17 ENCOUNTER — Other Ambulatory Visit: Payer: Medicaid Other

## 2017-12-18 ENCOUNTER — Ambulatory Visit: Payer: Self-pay | Attending: Nurse Practitioner

## 2017-12-18 DIAGNOSIS — E039 Hypothyroidism, unspecified: Secondary | ICD-10-CM | POA: Insufficient documentation

## 2017-12-18 NOTE — Progress Notes (Signed)
Patient here for lab visit only 

## 2017-12-19 LAB — TSH: TSH: 36.6 u[IU]/mL — AB (ref 0.450–4.500)

## 2017-12-20 ENCOUNTER — Other Ambulatory Visit: Payer: Self-pay | Admitting: Nurse Practitioner

## 2017-12-20 MED ORDER — LEVOTHYROXINE SODIUM 150 MCG PO TABS: 150.0000 ug | ORAL_TABLET | Freq: Every day | ORAL | 1 refills | Status: DC

## 2017-12-21 ENCOUNTER — Telehealth: Payer: Self-pay

## 2017-12-21 NOTE — Telephone Encounter (Signed)
CMA spoke to patient to inform on results.  Patient understood and is aware PCP sent a new Thyroid medication.  Patient have a OV with PCP 6 weeks from now on 02/01/2018, patient stated he will can get his thyroid re-check on the same visit.

## 2017-12-21 NOTE — Telephone Encounter (Signed)
-----   Message from Claiborne Rigg, NP sent at 12/20/2017 11:40 AM EDT ----- Thyroid levels are still elevated. Will increase synthroid. Make an appointment for you thyroid level to be rechecked in 6 weeks. Also make sure you are taking your thyroid medication by itself at least 15-30 minutes prior to food or any other medications.

## 2017-12-23 NOTE — Telephone Encounter (Signed)
noted 

## 2018-01-05 MED FILL — LEVOTHYROXINE 150 MCG TAB: 150 | 30 days supply | Qty: 30 | Fill #0

## 2018-01-15 ENCOUNTER — Ambulatory Visit: Payer: Self-pay | Attending: Nurse Practitioner

## 2018-02-01 ENCOUNTER — Ambulatory Visit: Payer: Self-pay | Attending: Nurse Practitioner | Admitting: Nurse Practitioner

## 2018-02-01 ENCOUNTER — Encounter: Payer: Self-pay | Admitting: Nurse Practitioner

## 2018-02-01 VITALS — BP 123/83 | HR 71 | Temp 98.6°F | Ht 69.0 in | Wt 237.0 lb

## 2018-02-01 DIAGNOSIS — Z808 Family history of malignant neoplasm of other organs or systems: Secondary | ICD-10-CM | POA: Insufficient documentation

## 2018-02-01 DIAGNOSIS — E7800 Pure hypercholesterolemia, unspecified: Secondary | ICD-10-CM | POA: Insufficient documentation

## 2018-02-01 DIAGNOSIS — I10 Essential (primary) hypertension: Secondary | ICD-10-CM | POA: Insufficient documentation

## 2018-02-01 DIAGNOSIS — Z79890 Hormone replacement therapy (postmenopausal): Secondary | ICD-10-CM | POA: Insufficient documentation

## 2018-02-01 DIAGNOSIS — Z79899 Other long term (current) drug therapy: Secondary | ICD-10-CM | POA: Insufficient documentation

## 2018-02-01 DIAGNOSIS — E039 Hypothyroidism, unspecified: Secondary | ICD-10-CM | POA: Insufficient documentation

## 2018-02-01 DIAGNOSIS — Z7951 Long term (current) use of inhaled steroids: Secondary | ICD-10-CM | POA: Insufficient documentation

## 2018-02-01 MED FILL — AMLODIPINE BESYLATE 10 MG T: 10 | 30 days supply | Qty: 30 | Fill #5

## 2018-02-01 MED FILL — CYCLOBENZAPRINE 10 MG TAB: 10 | 10 days supply | Qty: 30 | Fill #1

## 2018-02-01 MED FILL — IBUPROFEN 800 MG TABLET: 800 | 20 days supply | Qty: 60 | Fill #1

## 2018-02-01 NOTE — Progress Notes (Signed)
Assessment & Plan:  Larry Thomas was seen today for follow-up.  Diagnoses and all orders for this visit:  Acquired hypothyroidism -     TSH Will refill synthroid based on repeat TSH level. Lab Results  Component Value Date   TSH 36.600 (H) 12/18/2017    Patient has been counseled on age-appropriate routine health concerns for screening and prevention. These are reviewed and up-to-date. Referrals have been placed accordingly. Immunizations are up-to-date or declined.    Subjective:   Chief Complaint  Patient presents with  . Follow-up    Pt. is here to follow-up on his thyroid.    HPI Larry Thomas 42 y.o. male presents to office today for follow up to hypothyroidism  Hypothyroidism: Patient presents for evaluation of thyroid function. Symptoms consist of fatigue. Symptoms have present for several weeks. The symptoms are mild.  The problem has been stable.  Previous thyroid studies include TSH, triiodothyronine free and total T4. The hypothyroidism is due to hypothyroidism. He is taking levothyroxine 150 mcg as prescribed.    Review of Systems  Constitutional: Negative for fever, malaise/fatigue and weight loss.  HENT: Negative.  Negative for nosebleeds.   Eyes: Negative.  Negative for blurred vision, double vision and photophobia.  Respiratory: Negative.  Negative for cough and shortness of breath.   Cardiovascular: Negative.  Negative for chest pain, palpitations and leg swelling.  Gastrointestinal: Negative.  Negative for heartburn, nausea and vomiting.  Musculoskeletal: Negative.  Negative for myalgias.  Neurological: Negative.  Negative for dizziness, focal weakness, seizures and headaches.  Psychiatric/Behavioral: Negative.  Negative for suicidal ideas.    Past Medical History:  Diagnosis Date  . Hypercholesterolemia   . Hypertension   . Hypothyroidism   . Thyroid disease     Past Surgical History:  Procedure Laterality Date  . BILATERAL CARPAL TUNNEL RELEASE  Bilateral 04/12/2015   Procedure: BILATERAL CARPAL TUNNEL RELEASE;  Surgeon: Betha Loa, MD;  Location: [facility];  Service: Orthopedics;  Laterality: Bilateral;    Family History  Problem Relation Age of Onset  . Thyroid cancer Mother     Social History Reviewed with no changes to be made today.   Outpatient Medications Prior to Visit  Medication Sig Dispense Refill  . amLODipine (NORVASC) 5 MG tablet Take 1 tablet (5 mg total) by mouth daily. 90 tablet 0  . cyclobenzaprine (FLEXERIL) 10 MG tablet Take 1 tablet (10 mg total) by mouth 3 (three) times daily. 30 tablet 1  . fluticasone (FLONASE) 50 MCG/ACT nasal spray Place 1 spray into both nostrils daily. 16 g 6  . ibuprofen (ADVIL,MOTRIN) 800 MG tablet Take 1 tablet (800 mg total) by mouth every 8 (eight) hours as needed. 60 tablet 1  . Multiple Vitamins-Minerals (MULTIVITAMIN & MINERAL PO) Take 1 tablet by mouth daily.    Marland Kitchen levothyroxine (SYNTHROID, LEVOTHROID) 150 MCG tablet Take 1 tablet (150 mcg total) by mouth daily. 30 tablet 1   No facility-administered medications prior to visit.     No Known Allergies     Objective:    BP 123/83 (BP Location: Left Arm, Patient Position: Sitting, Cuff Size: Normal)   Pulse 71   Temp 98.6 F (37 C) (Oral)   Ht 5\' 9"  (1.753 m)   Wt 237 lb (107.5 kg)   SpO2 98%   BMI 35.00 kg/m  Wt Readings from Last 3 Encounters:  02/01/18 237 lb (107.5 kg)  10/30/17 223 lb (101.2 kg)  09/28/17 234 lb (106.1 kg)  Physical Exam  Constitutional: He is oriented to person, place, and time. He appears well-developed and well-nourished. He is cooperative.  HENT:  Head: Normocephalic and atraumatic.  Eyes: EOM are normal.  Neck: Normal range of motion.  Cardiovascular: Normal rate, regular rhythm, normal heart sounds and intact distal pulses. Exam reveals no gallop and no friction rub.  No murmur heard. Pulmonary/Chest: Effort normal and breath sounds normal. No tachypnea. No  respiratory distress. He has no decreased breath sounds. He has no wheezes. He has no rhonchi. He has no rales. He exhibits no tenderness.  Abdominal: Soft. Bowel sounds are normal.  Musculoskeletal: Normal range of motion. He exhibits no edema.  Neurological: He is alert and oriented to person, place, and time. Coordination normal.  Skin: Skin is warm and dry.  Psychiatric: He has a normal mood and affect. His behavior is normal. Judgment and thought content normal.  Nursing note and vitals reviewed.      Patient has been counseled extensively about nutrition and exercise as well as the importance of adherence with medications and regular follow-up. The patient was given clear instructions to go to ER or return to medical center if symptoms don't improve, worsen or new problems develop. The patient verbalized understanding.   Follow-up: Return in about 3 months (around 05/04/2018).   Claiborne Rigg, FNP-BC [facility] [place], [place] [phone]   02/01/2018, 10:19 AM

## 2018-02-01 NOTE — Patient Instructions (Signed)
Hypothyroidism Hypothyroidism is a disorder of the thyroid. The thyroid is a large gland that is located in the lower front of the neck. The thyroid releases hormones that control how the body works. With hypothyroidism, the thyroid does not make enough of these hormones. What are the causes? Causes of hypothyroidism may include:  Viral infections.  Pregnancy.  Your own defense system (immune system) attacking your thyroid.  Certain medicines.  Birth defects.  Past radiation treatments to your head or neck.  Past treatment with radioactive iodine.  Past surgical removal of part or all of your thyroid.  Problems with the gland that is located in the center of your brain (pituitary).  What are the signs or symptoms? Signs and symptoms of hypothyroidism may include:  Feeling as though you have no energy (lethargy).  Inability to tolerate cold.  Weight gain that is not explained by a change in diet or exercise habits.  Dry skin.  Coarse hair.  Menstrual irregularity.  Slowing of thought processes.  Constipation.  Sadness or depression.  How is this diagnosed? Your health care provider may diagnose hypothyroidism with blood tests and ultrasound tests. How is this treated? Hypothyroidism is treated with medicine that replaces the hormones that your body does not make. After you begin treatment, it may take several weeks for symptoms to go away. Follow these instructions at home:  Take medicines only as directed by your health care provider.  If you start taking any new medicines, tell your health care provider.  Keep all follow-up visits as directed by your health care provider. This is important. As your condition improves, your dosage needs may change. You will need to have blood tests regularly so that your health care provider can watch your condition. Contact a health care provider if:  Your symptoms do not get better with treatment.  You are taking thyroid  replacement medicine and: ? You sweat excessively. ? You have tremors. ? You feel anxious. ? You lose weight rapidly. ? You cannot tolerate heat. ? You have emotional swings. ? You have diarrhea. ? You feel weak. Get help right away if:  You develop chest pain.  You develop an irregular heartbeat.  You develop a rapid heartbeat. This information is not intended to replace advice given to you by your health care provider. Make sure you discuss any questions you have with your health care provider. Document Released: 03/10/2005 Document Revised: 08/16/2015 Document Reviewed: 07/26/2013 Elsevier Interactive Patient Education  2018 [employer] Inc.  

## 2018-02-02 ENCOUNTER — Other Ambulatory Visit: Payer: Self-pay | Admitting: Nurse Practitioner

## 2018-02-02 ENCOUNTER — Telehealth: Payer: Self-pay

## 2018-02-02 LAB — TSH: TSH: 30.82 u[IU]/mL — AB (ref 0.450–4.500)

## 2018-02-02 MED ORDER — LEVOTHYROXINE SODIUM 200 MCG PO TABS: 200.0000 ug | ORAL_TABLET | Freq: Every day | ORAL | 1 refills | Status: DC

## 2018-02-02 MED FILL — LEVOTHYROXINE 200 MCG TAB: 200 | 30 days supply | Qty: 30 | Fill #0

## 2018-02-02 NOTE — Telephone Encounter (Signed)
CMA spoke to patient to inform on results.  Patient verified DOB. Patient understood.  Patient is aware once he pick up his new thyroid medication he needs to make a 6 weeks thyroid lab appt.

## 2018-02-02 NOTE — Telephone Encounter (Signed)
-----   Message from Claiborne RiggZelda W Fleming, NP sent at 02/02/2018  9:26 AM EST ----- TSH still elevated. Will increase thyroid medication. Prescription has been sent to pharmacy. Please make a lab appointment 4-6 weeks for repeat thyroid level

## 2018-02-26 ENCOUNTER — Ambulatory Visit: Payer: Self-pay | Attending: Nurse Practitioner | Admitting: Nurse Practitioner

## 2018-02-26 ENCOUNTER — Other Ambulatory Visit: Payer: Self-pay

## 2018-02-26 ENCOUNTER — Encounter: Payer: Self-pay | Admitting: Nurse Practitioner

## 2018-02-26 VITALS — BP 116/81 | HR 73 | Temp 98.1°F | Resp 16 | Wt 231.4 lb

## 2018-02-26 DIAGNOSIS — Z9889 Other specified postprocedural states: Secondary | ICD-10-CM | POA: Insufficient documentation

## 2018-02-26 DIAGNOSIS — Z79890 Hormone replacement therapy (postmenopausal): Secondary | ICD-10-CM | POA: Insufficient documentation

## 2018-02-26 DIAGNOSIS — Z791 Long term (current) use of non-steroidal anti-inflammatories (NSAID): Secondary | ICD-10-CM | POA: Insufficient documentation

## 2018-02-26 DIAGNOSIS — I10 Essential (primary) hypertension: Secondary | ICD-10-CM | POA: Insufficient documentation

## 2018-02-26 DIAGNOSIS — E039 Hypothyroidism, unspecified: Secondary | ICD-10-CM | POA: Insufficient documentation

## 2018-02-26 MED ORDER — AMLODIPINE BESYLATE 5 MG PO TABS: 5.0000 mg | ORAL_TABLET | Freq: Every day | ORAL | 0 refills | Status: DC

## 2018-02-26 MED FILL — AMLODIPINE BESYLATE 5 MG TA: 5 | 90 days supply | Qty: 90 | Fill #0

## 2018-02-26 MED FILL — AMLODIPINE BESYLATE 10 MG T: 10 | 30 days supply | Qty: 30 | Fill #6

## 2018-02-26 MED FILL — FLUTICASONE PROP 50 MCG SPR: 50 | 30 days supply | Qty: 16 | Fill #1

## 2018-02-26 NOTE — Progress Notes (Signed)
Follow up.

## 2018-02-26 NOTE — Progress Notes (Signed)
Assessment & Plan:  Diagnoses and all orders for this visit:  Primary hypothyroidism -     TSH Chronic. Not well controlled.   Essential hypertension -     amLODipine (NORVASC) 5 MG tablet; Take 1 tablet (5 mg total) by mouth daily. Continue all antihypertensives as prescribed.  Remember to bring in your blood pressure log with you for your follow up appointment.  DASH/Mediterranean Diets are healthier choices for HTN.    Patient has been counseled on age-appropriate routine health concerns for screening and prevention. These are reviewed and up-to-date. Referrals have been placed accordingly. Immunizations are up-to-date or declined.    Subjective:  No chief complaint on file.  HPI Larry Thomas 42 y.o. male presents to office today for thyroid check.  Thyroid: Patient presents for evaluation of hypothyroidism.  Patient denies denies fatigue, weight changes, heat/cold intolerance, bowel/skin changes or CVS symptoms. He takes synthroid every morning with no other medications and per his report tries to take it around the same time.   CHRONIC HYPERTENSION Disease Monitoring  Blood pressure is well controlled. He does not monitor his blood pressure at home.   Chest pain: no   Dyspnea: no   Claudication: no  Medication compliance: yes, taking amlodipine 5mg   Medication Side Effects  Lightheadedness: no   Urinary frequency: no   Edema: no   Impotence: no  Preventitive Healthcare:  Exercise: no   Diet Pattern: diet: general  Salt Restriction:  no   Review of Systems  Constitutional: Negative for fever, malaise/fatigue and weight loss.  HENT: Negative.  Negative for nosebleeds.   Eyes: Negative.  Negative for blurred vision, double vision and photophobia.  Respiratory: Negative.  Negative for cough and shortness of breath.   Cardiovascular: Negative.  Negative for chest pain, palpitations and leg swelling.  Gastrointestinal: Negative.  Negative for heartburn, nausea  and vomiting.  Musculoskeletal: Negative.  Negative for myalgias.  Neurological: Negative.  Negative for dizziness, focal weakness, seizures and headaches.  Psychiatric/Behavioral: Negative.  Negative for suicidal ideas.    Past Medical History:  Diagnosis Date  . Hypercholesterolemia   . Hypertension   . Hypothyroidism   . Thyroid disease     Past Surgical History:  Procedure Laterality Date  . BILATERAL CARPAL TUNNEL RELEASE Bilateral 04/12/2015   Procedure: BILATERAL CARPAL TUNNEL RELEASE;  Surgeon: Betha LoaKevin Kuzma, MD;  Location: [facility];  Service: Orthopedics;  Laterality: Bilateral;    Family History  Problem Relation Age of Onset  . Thyroid cancer Mother     Social History Reviewed with no changes to be made today.   Outpatient Medications Prior to Visit  Medication Sig Dispense Refill  . cyclobenzaprine (FLEXERIL) 10 MG tablet Take 1 tablet (10 mg total) by mouth 3 (three) times daily. 30 tablet 1  . fluticasone (FLONASE) 50 MCG/ACT nasal spray Place 1 spray into both nostrils daily. 16 g 6  . ibuprofen (ADVIL,MOTRIN) 800 MG tablet Take 1 tablet (800 mg total) by mouth every 8 (eight) hours as needed. 60 tablet 1  . levothyroxine (SYNTHROID, LEVOTHROID) 200 MCG tablet Take 1 tablet (200 mcg total) by mouth daily. 30 tablet 1  . Multiple Vitamins-Minerals (MULTIVITAMIN & MINERAL PO) Take 1 tablet by mouth daily.    Marland Kitchen. amLODipine (NORVASC) 5 MG tablet Take 1 tablet (5 mg total) by mouth daily. 90 tablet 0   No facility-administered medications prior to visit.     No Known Allergies     Objective:  BP 116/81 (BP Location: Left Arm, Patient Position: Sitting, Cuff Size: Large)   Pulse 73   Temp 98.1 F (36.7 C)   Resp 16   Wt 231 lb 6.4 oz (105 kg)   SpO2 99%   BMI 34.17 kg/m  Wt Readings from Last 3 Encounters:  02/26/18 231 lb 6.4 oz (105 kg)  02/01/18 237 lb (107.5 kg)  10/30/17 223 lb (101.2 kg)    Physical Exam  Constitutional: He is  oriented to person, place, and time. He appears well-developed and well-nourished. He is cooperative.  HENT:  Head: Normocephalic and atraumatic.  Eyes: EOM are normal.  Neck: Normal range of motion.  Cardiovascular: Normal rate, regular rhythm and normal heart sounds. Exam reveals no gallop and no friction rub.  No murmur heard. Pulmonary/Chest: Effort normal and breath sounds normal. No tachypnea. No respiratory distress. He has no decreased breath sounds. He has no wheezes. He has no rhonchi. He has no rales. He exhibits no tenderness.  Abdominal: Bowel sounds are normal.  Musculoskeletal: Normal range of motion. He exhibits no edema.  Neurological: He is alert and oriented to person, place, and time. Coordination normal.  Skin: Skin is warm and dry.  Psychiatric: He has a normal mood and affect. His behavior is normal. Judgment and thought content normal.  Nursing note and vitals reviewed.        Patient has been counseled extensively about nutrition and exercise as well as the importance of adherence with medications and regular follow-up. The patient was given clear instructions to go to ER or return to medical center if symptoms don't improve, worsen or new problems develop. The patient verbalized understanding.   Follow-up: Return for make 6 week lab appt only. Already has appt with me in feb. Do not change this.   Claiborne Rigg, FNP-BC [facility] [place], [place] [phone]   02/26/2018, 9:35 AM

## 2018-02-27 LAB — TSH: TSH: 15.37 u[IU]/mL — ABNORMAL HIGH (ref 0.450–4.500)

## 2018-03-01 ENCOUNTER — Other Ambulatory Visit: Payer: Self-pay | Admitting: Nurse Practitioner

## 2018-03-01 MED ORDER — LEVOTHYROXINE SODIUM 300 MCG PO TABS: 300.0000 ug | ORAL_TABLET | Freq: Every day | ORAL | 2 refills | Status: DC

## 2018-03-05 ENCOUNTER — Other Ambulatory Visit: Payer: Self-pay | Admitting: Nurse Practitioner

## 2018-03-05 ENCOUNTER — Telehealth (INDEPENDENT_AMBULATORY_CARE_PROVIDER_SITE_OTHER): Payer: Self-pay

## 2018-03-05 MED ORDER — LEVOTHYROXINE SODIUM 150 MCG PO TABS: 300.0000 ug | ORAL_TABLET | Freq: Every day | ORAL | 1 refills | Status: DC

## 2018-03-05 MED FILL — LEVOTHYROXINE 150 MCG TAB: 150 | 30 days supply | Qty: 60 | Fill #0

## 2018-03-05 NOTE — Telephone Encounter (Signed)
Left voicemail notifying patient that thyroid levels continue to improve however thyroid medication needs to be increased at this time. Next lab appt should be in 6 weeks. No need to see PCP just go in for lab appt. Call clinic with any questions and to schedule lab appt. Maryjean Mornempestt S Roberts, CMA

## 2018-03-05 NOTE — Telephone Encounter (Signed)
-----   Message from Claiborne RiggZelda W Fleming, NP sent at 03/01/2018 10:29 PM EST ----- Thyroid levels continue to improve however will need to increase your thyroid medication at this time. Your next lab appointment should be made in 6 weeks. You do not need an appt to see me you can just go to the lab for your appt.

## 2018-04-09 ENCOUNTER — Ambulatory Visit: Payer: Medicaid Other | Attending: Nurse Practitioner

## 2018-04-09 DIAGNOSIS — E039 Hypothyroidism, unspecified: Secondary | ICD-10-CM

## 2018-04-09 MED FILL — LEVOTHYROXINE 150 MCG TAB: 150 | 30 days supply | Qty: 60 | Fill #1

## 2018-04-10 LAB — TSH: TSH: 1.18 u[IU]/mL (ref 0.450–4.500)

## 2018-04-11 ENCOUNTER — Other Ambulatory Visit: Payer: Self-pay | Admitting: Nurse Practitioner

## 2018-04-11 MED ORDER — LEVOTHYROXINE SODIUM 150 MCG PO TABS: 300.0000 ug | ORAL_TABLET | Freq: Every day | ORAL | 1 refills | Status: DC

## 2018-04-12 ENCOUNTER — Telehealth: Payer: Self-pay | Admitting: Nurse Practitioner

## 2018-04-12 NOTE — Telephone Encounter (Signed)
Pt called for lab results and his Nurse was not available, please follow up

## 2018-04-16 ENCOUNTER — Telehealth (INDEPENDENT_AMBULATORY_CARE_PROVIDER_SITE_OTHER): Payer: Self-pay

## 2018-04-16 NOTE — Telephone Encounter (Signed)
-----   Message from Claiborne Rigg, NP sent at 04/11/2018 11:56 PM EST ----- Thyroid level is normal. Will recheck in 4 weeks. Will continue on current dose of synthroid at this time. Please make a lab appointment for 4 weeks.

## 2018-04-16 NOTE — Telephone Encounter (Signed)
Patient is aware that thyroid levels are normal. Continue on current dose of synthroid at this time. Will recheck thyroid in 4 weeks. Advised patient to call clinic and schedule lab only visit for 4 weeks. Patient stated he would call and schedule. Maryjean Mornempestt S Lawonda Pretlow, CMA

## 2018-04-19 NOTE — Telephone Encounter (Signed)
Pt is aware of results of lab test. Informed of results by Cristela Felt, CMA

## 2018-05-05 ENCOUNTER — Ambulatory Visit: Payer: Medicaid Other | Admitting: Nurse Practitioner

## 2018-05-14 ENCOUNTER — Ambulatory Visit: Payer: Medicaid Other | Admitting: Nurse Practitioner

## 2018-05-17 ENCOUNTER — Ambulatory Visit: Payer: Self-pay | Attending: Nurse Practitioner | Admitting: Nurse Practitioner

## 2018-05-17 ENCOUNTER — Encounter: Payer: Self-pay | Admitting: Nurse Practitioner

## 2018-05-17 VITALS — BP 130/85 | HR 57 | Temp 98.6°F | Ht 69.0 in | Wt 240.4 lb

## 2018-05-17 DIAGNOSIS — I10 Essential (primary) hypertension: Secondary | ICD-10-CM | POA: Insufficient documentation

## 2018-05-17 DIAGNOSIS — R001 Bradycardia, unspecified: Secondary | ICD-10-CM | POA: Insufficient documentation

## 2018-05-17 DIAGNOSIS — Z79899 Other long term (current) drug therapy: Secondary | ICD-10-CM | POA: Insufficient documentation

## 2018-05-17 DIAGNOSIS — E7800 Pure hypercholesterolemia, unspecified: Secondary | ICD-10-CM | POA: Insufficient documentation

## 2018-05-17 DIAGNOSIS — J339 Nasal polyp, unspecified: Secondary | ICD-10-CM | POA: Insufficient documentation

## 2018-05-17 DIAGNOSIS — E039 Hypothyroidism, unspecified: Secondary | ICD-10-CM | POA: Insufficient documentation

## 2018-05-17 DIAGNOSIS — R0602 Shortness of breath: Secondary | ICD-10-CM | POA: Insufficient documentation

## 2018-05-17 MED ORDER — AMLODIPINE BESYLATE 5 MG PO TABS: 5.0000 mg | ORAL_TABLET | Freq: Every day | ORAL | 1 refills | Status: DC

## 2018-05-17 MED ORDER — LEVOTHYROXINE SODIUM 300 MCG PO TABS: 300.0000 ug | ORAL_TABLET | Freq: Every day | ORAL | 1 refills | Status: DC

## 2018-05-17 MED FILL — AMLODIPINE BESYLATE 5 MG TA: 5 | 30 days supply | Qty: 30 | Fill #0

## 2018-05-17 NOTE — Progress Notes (Signed)
Assessment & Plan:  Draco was seen today for follow-up.  Diagnoses and all orders for this visit:  Essential hypertension -     amLODipine (NORVASC) 5 MG tablet; Take 1 tablet (5 mg total) by mouth daily. Continue all antihypertensives as prescribed.  Remember to bring in your blood pressure log with you for your follow up appointment.  DASH/Mediterranean Diets are healthier choices for HTN.    Hypothyroidism, unspecified type -     TSH -     levothyroxine (SYNTHROID, LEVOTHROID) 300 MCG tablet; Take 1 tablet (300 mcg total) by mouth daily.  Nasal polyp -     Ambulatory referral to ENT  Bradycardia -     ECHOCARDIOGRAM COMPLETE; Future  Shortness of breath -     ECHOCARDIOGRAM COMPLETE; Future    Patient has been counseled on age-appropriate routine health concerns for screening and prevention. These are reviewed and up-to-date. Referrals have been placed accordingly. Immunizations are up-to-date or declined.    Subjective:   Chief Complaint  Patient presents with  . Follow-up    Pt. is here to follow-up on Hypertension and Thyroid.   HPI Larry Thomas 43 y.o. male presents to office today for HTN and TSH.   CHRONIC HYPERTENSION Disease Monitoring  Blood pressure range Well controlled on amlodipine 5mg  daily. BP Readings from Last 3 Encounters:  05/17/18 130/85  02/26/18 116/81  02/01/18 123/83    Chest pain: no   Dyspnea: yes, mild Shortness of breath with exertion   Claudication: no  Medication compliance: yes  Medication Side Effects  Lightheadedness: no   Urinary frequency: no   Edema: no   Impotence: no  Preventitive Healthcare:  Exercise: no   Diet Pattern: diet: general  Salt Restriction:  yes    Hypothyroidism Chronic.  Symptoms consist of denies fatigue, weight changes, heat/cold intolerance, bowel/skin changes. He endorses medication compliance taking synthroid 300mg  daily.  Lab Results  Component Value Date   TSH 4.070 05/17/2018     T4TOTAL 3.4 (L) 09/28/2017    Review of Systems  Constitutional: Negative for fever, malaise/fatigue and weight loss.  HENT: Negative for congestion, nosebleeds and sinus pain.        Nasal obstruction  Eyes: Negative.  Negative for blurred vision, double vision and photophobia.  Respiratory: Positive for shortness of breath (mild with rest and activity). Negative for cough.   Cardiovascular: Negative.  Negative for chest pain, palpitations and leg swelling.  Gastrointestinal: Negative.  Negative for heartburn, nausea and vomiting.  Musculoskeletal: Negative.  Negative for myalgias.  Neurological: Negative.  Negative for dizziness, focal weakness, seizures and headaches.  Psychiatric/Behavioral: Negative.  Negative for suicidal ideas.    Past Medical History:  Diagnosis Date  . Hypercholesterolemia   . Hypertension   . Hypothyroidism   . Thyroid disease     Past Surgical History:  Procedure Laterality Date  . BILATERAL CARPAL TUNNEL RELEASE Bilateral 04/12/2015   Procedure: BILATERAL CARPAL TUNNEL RELEASE;  Surgeon: Betha Loa, MD;  Location: [facility];  Service: Orthopedics;  Laterality: Bilateral;    Family History  Problem Relation Age of Onset  . Thyroid cancer Mother     Social History Reviewed with no changes to be made today.   Outpatient Medications Prior to Visit  Medication Sig Dispense Refill  . cyclobenzaprine (FLEXERIL) 10 MG tablet Take 1 tablet (10 mg total) by mouth 3 (three) times daily. 30 tablet 1  . fluticasone (FLONASE) 50 MCG/ACT nasal spray Place 1  spray into both nostrils daily. 16 g 6  . ibuprofen (ADVIL,MOTRIN) 800 MG tablet Take 1 tablet (800 mg total) by mouth every 8 (eight) hours as needed. 60 tablet 1  . Multiple Vitamins-Minerals (MULTIVITAMIN & MINERAL PO) Take 1 tablet by mouth daily.    Marland Kitchen amLODipine (NORVASC) 5 MG tablet Take 1 tablet (5 mg total) by mouth daily. 90 tablet 0  . levothyroxine (SYNTHROID, LEVOTHROID) 150  MCG tablet Take 2 tablets (300 mcg total) by mouth daily for 30 days. 60 tablet 1   No facility-administered medications prior to visit.     No Known Allergies     Objective:    BP 130/85 (BP Location: Left Arm, Patient Position: Sitting, Cuff Size: Normal)   Pulse (!) 57   Temp 98.6 F (37 C) (Oral)   Ht 5\' 9"  (1.753 m)   Wt 240 lb 6.4 oz (109 kg)   SpO2 100%   BMI 35.50 kg/m  Wt Readings from Last 3 Encounters:  05/17/18 240 lb 6.4 oz (109 kg)  02/26/18 231 lb 6.4 oz (105 kg)  02/01/18 237 lb (107.5 kg)    Physical Exam Vitals signs and nursing note reviewed.  Constitutional:      Appearance: He is well-developed.  HENT:     Head: Normocephalic and atraumatic.     Nose: Nasal deformity (nasal polyp left nares; partially obstruction airway) present.     Left Nostril: Occlusion (partially) present.     Right Sinus: No maxillary sinus tenderness or frontal sinus tenderness.     Left Sinus: No maxillary sinus tenderness or frontal sinus tenderness.  Neck:     Musculoskeletal: Normal range of motion.  Cardiovascular:     Rate and Rhythm: Regular rhythm. Bradycardia present.     Heart sounds: Normal heart sounds. No murmur. No friction rub. No gallop.   Pulmonary:     Effort: Pulmonary effort is normal. No tachypnea or respiratory distress.     Breath sounds: Normal breath sounds. No stridor. No decreased breath sounds, wheezing, rhonchi or rales.  Chest:     Chest wall: No tenderness.  Abdominal:     General: Bowel sounds are normal.     Palpations: Abdomen is soft.  Musculoskeletal: Normal range of motion.  Skin:    General: Skin is warm and dry.  Neurological:     Mental Status: He is alert and oriented to person, place, and time.     Coordination: Coordination normal.  Psychiatric:        Behavior: Behavior normal. Behavior is cooperative.        Thought Content: Thought content normal.        Judgment: Judgment normal.          Patient has been  counseled extensively about nutrition and exercise as well as the importance of adherence with medications and regular follow-up. The patient was given clear instructions to go to ER or return to medical center if symptoms don't improve, worsen or new problems develop. The patient verbalized understanding.   Follow-up: Return in about 3 months (around 08/15/2018) for HTN.   Claiborne Rigg, FNP-BC [facility] [place], [place] [phone]   05/20/2018, 9:28 AM

## 2018-05-18 LAB — TSH: TSH: 4.07 u[IU]/mL (ref 0.450–4.500)

## 2018-05-20 ENCOUNTER — Telehealth: Payer: Self-pay

## 2018-05-20 ENCOUNTER — Ambulatory Visit (HOSPITAL_COMMUNITY)
Admission: RE | Admit: 2018-05-20 | Discharge: 2018-05-20 | Disposition: A | Discharge status: Home / Self Care | Payer: Self-pay | Source: Ambulatory Visit | Attending: Nurse Practitioner | Admitting: Nurse Practitioner

## 2018-05-20 ENCOUNTER — Encounter: Payer: Self-pay | Admitting: Nurse Practitioner

## 2018-05-20 ENCOUNTER — Other Ambulatory Visit: Payer: Self-pay | Admitting: Nurse Practitioner

## 2018-05-20 DIAGNOSIS — R001 Bradycardia, unspecified: Secondary | ICD-10-CM

## 2018-05-20 DIAGNOSIS — R0602 Shortness of breath: Secondary | ICD-10-CM

## 2018-05-20 DIAGNOSIS — I10 Essential (primary) hypertension: Secondary | ICD-10-CM | POA: Insufficient documentation

## 2018-05-20 DIAGNOSIS — I358 Other nonrheumatic aortic valve disorders: Secondary | ICD-10-CM | POA: Insufficient documentation

## 2018-05-20 DIAGNOSIS — E785 Hyperlipidemia, unspecified: Secondary | ICD-10-CM | POA: Insufficient documentation

## 2018-05-20 DIAGNOSIS — R9431 Abnormal electrocardiogram [ECG] [EKG]: Secondary | ICD-10-CM | POA: Insufficient documentation

## 2018-05-20 NOTE — Telephone Encounter (Signed)
CMA spoke to patient to inform on results.  Pt. Verified DOB. Pt. Understood.  

## 2018-05-20 NOTE — Progress Notes (Signed)
  Echocardiogram 2D Echocardiogram has been performed.  Larry Thomas 05/20/2018, 8:45 AM

## 2018-05-20 NOTE — Telephone Encounter (Signed)
-----   Message from Claiborne Rigg, NP sent at 05/20/2018 12:01 AM EST ----- Thyroid level is normal. Continue on current dose of synthroid

## 2018-05-21 MED FILL — LEVOTHYROXINE 300 MCG TAB: 300 | 30 days supply | Qty: 30 | Fill #0

## 2018-06-24 MED FILL — LEVOTHYROXINE 300 MCG TAB: 300 | 30 days supply | Qty: 30 | Fill #1

## 2018-08-18 ENCOUNTER — Ambulatory Visit: Payer: Self-pay | Attending: Nurse Practitioner | Admitting: Nurse Practitioner

## 2018-08-18 ENCOUNTER — Encounter: Payer: Self-pay | Admitting: Nurse Practitioner

## 2018-08-18 DIAGNOSIS — E039 Hypothyroidism, unspecified: Secondary | ICD-10-CM

## 2018-08-18 DIAGNOSIS — I10 Essential (primary) hypertension: Secondary | ICD-10-CM

## 2018-08-18 MED ORDER — LEVOTHYROXINE SODIUM 300 MCG PO TABS: 300.0000 ug | ORAL_TABLET | Freq: Every day | ORAL | 0 refills | Status: DC

## 2018-08-18 MED ORDER — AMLODIPINE BESYLATE 5 MG PO TABS: 5.0000 mg | ORAL_TABLET | Freq: Every day | ORAL | 1 refills | Status: DC

## 2018-08-18 MED FILL — AMLODIPINE BESYLATE 5 MG TA: 5 | 30 days supply | Qty: 30 | Fill #0

## 2018-08-18 MED FILL — LEVOTHYROXINE 300 MCG TAB: 300 | 30 days supply | Qty: 30 | Fill #0

## 2018-08-18 NOTE — Progress Notes (Signed)
Virtual Visit via Telephone Note Due to national recommendations of social distancing due to COVID 19, telehealth visit is felt to be most appropriate for this patient at this time.  I discussed the limitations, risks, security and privacy concerns of performing an evaluation and management service by telephone and the availability of in person appointments. I also discussed with the patient that there may be a patient responsible charge related to this service. The patient expressed understanding and agreed to proceed.    I connected with Larry Thomas on 08/18/18  at   8:30 AM EDT  EDT by telephone and verified that I am speaking with the correct person using two identifiers.   Consent I discussed the limitations, risks, security and privacy concerns of performing an evaluation and management service by telephone and the availability of in person appointments. I also discussed with the patient that there may be a patient responsible charge related to this service. The patient expressed understanding and agreed to proceed.   Location of Patient: Private  Residence   [facility]    Persons participating in Telemedicine visit: Bertram DenverZelda Fleming FNP-BC YY EdgemoorBien CMA Larry GaleMichael A Schlender    History of Present Illness: Telemedicine visit for: HTN and Hypothyroidism   Hypothyroidism  Taking synthroid 300 mg daily as prescribed. Ran out of synthroid over a week ago. He denies fatigue, weight changes, heat/cold intolerance, bowel/skin changes or CVS symptoms.  Lab Results  Component Value Date   TSH 4.070 05/17/2018    Essential Hypertension Blood pressure well controlled. Taking amlodipine 5 mg daily as prescribed. He does not have a monitor at home so is unable to check his blood pressure. Denies chest pain, shortness of breath, palpitations, lightheadedness, dizziness, headaches or BLE edema.  BP Readings from Last 3 Encounters:   05/17/18 130/85  02/26/18 116/81  02/01/18 123/83     Past Medical History:  Diagnosis Date  . Hypercholesterolemia   . Hypertension   . Hypothyroidism   . Thyroid disease     Past Surgical History:  Procedure Laterality Date  . BILATERAL CARPAL TUNNEL RELEASE Bilateral 04/12/2015   Procedure: BILATERAL CARPAL TUNNEL RELEASE;  Surgeon: Betha LoaKevin Kuzma, MD;  [facility];  Service: Orthopedics;  Laterality: Bilateral;    Family History  Problem Relation Age of Onset  . Thyroid cancer Mother     Social History   Socioeconomic History  . Marital status: Married    Spouse name: Not on file  . Number of children: Not on file  . Years of education: Not on file  . Highest education level: Not on file  Occupational History  . Not on file  Social Needs  . Financial resource strain: Not on file  . Food insecurity:    Worry: Not on file    Inability: Not on file  . Transportation needs:    Medical: Not on file    Non-medical: Not on file  Tobacco Use  . Smoking status: Former Games developermoker  . Smokeless tobacco: Never Used  Substance and Sexual Activity  . Alcohol use: No  . Drug use: No  . Sexual activity: Yes  Lifestyle  . Physical activity:    Days per week: Not on file    Minutes per session: Not on file  . Stress: Not on file  Relationships  . Social connections:    Talks on phone: Not on file    Gets together: Not on file  Attends religious service: Not on file    Active member of club or organization: Not on file    Attends meetings of clubs or organizations: Not on file    Relationship status: Not on file  Other Topics Concern  . Not on file  Social History Narrative   Patient is married.      Observations/Objective: Awake, alert and oriented x 3   Review of Systems  Constitutional: Negative for fever, malaise/fatigue and weight loss.  HENT: Negative.  Negative for nosebleeds.   Eyes: Negative.  Negative for blurred vision, double  vision and photophobia.  Respiratory: Negative.  Negative for cough and shortness of breath.   Cardiovascular: Negative.  Negative for chest pain, palpitations and leg swelling.  Gastrointestinal: Negative.  Negative for heartburn, nausea and vomiting.  Musculoskeletal: Negative.  Negative for myalgias.  Neurological: Negative.  Negative for dizziness, focal weakness, seizures and headaches.  Psychiatric/Behavioral: Negative.  Negative for suicidal ideas.    Assessment and Plan: Diagnoses and all orders for this visit:  Essential hypertension -     amLODipine (NORVASC) 5 MG tablet; Take 1 tablet (5 mg total) by mouth daily. Continue all antihypertensives as prescribed.  Remember to bring in your blood pressure log with you for your follow up appointment.  DASH/Mediterranean Diets are healthier choices for HTN.    Hypothyroidism, unspecified type -     levothyroxine (SYNTHROID) 300 MCG tablet; Take 1 tablet (300 mcg total) by mouth daily.   Follow Up Instructions Return in about 3 months (around 11/18/2018).     I discussed the assessment and treatment plan with the patient. The patient was provided an opportunity to ask questions and all were answered. The patient agreed with the plan and demonstrated an understanding of the instructions.   The patient was advised to call back or seek an in-person evaluation if the symptoms worsen or if the condition fails to improve as anticipated.  I provided 18 minutes of non-face-to-face time during this encounter including median intraservice time, reviewing previous notes, labs, imaging, medications and explaining diagnosis and management.  Claiborne Rigg, FNP-BC

## 2018-09-22 MED FILL — LEVOTHYROXINE 300 MCG TAB: 300 | 30 days supply | Qty: 30 | Fill #1

## 2018-09-22 MED FILL — AMLODIPINE BESYLATE 5 MG TA: 5 | 30 days supply | Qty: 30 | Fill #1

## 2018-10-27 MED FILL — AMLODIPINE BESYLATE 5 MG TA: 5 | 30 days supply | Qty: 30 | Fill #2

## 2018-10-27 MED FILL — LEVOTHYROXINE 300 MCG TAB: 300 | 30 days supply | Qty: 30 | Fill #2

## 2018-11-19 ENCOUNTER — Other Ambulatory Visit: Payer: Self-pay

## 2018-11-19 ENCOUNTER — Encounter: Payer: Self-pay | Admitting: Nurse Practitioner

## 2018-11-19 ENCOUNTER — Ambulatory Visit: Payer: Self-pay | Attending: Nurse Practitioner | Admitting: Nurse Practitioner

## 2018-11-19 VITALS — BP 122/79 | HR 64 | Temp 98.4°F | Ht 69.0 in | Wt 233.0 lb

## 2018-11-19 DIAGNOSIS — E782 Mixed hyperlipidemia: Secondary | ICD-10-CM

## 2018-11-19 DIAGNOSIS — I10 Essential (primary) hypertension: Secondary | ICD-10-CM

## 2018-11-19 DIAGNOSIS — E039 Hypothyroidism, unspecified: Secondary | ICD-10-CM

## 2018-11-19 MED ORDER — LEVOTHYROXINE SODIUM 300 MCG PO TABS: 300.0000 ug | ORAL_TABLET | Freq: Every day | ORAL | 0 refills | Status: DC

## 2018-11-19 NOTE — Progress Notes (Signed)
Assessment & Plan:  Larry Thomas was seen today for follow-up.  Diagnoses and all orders for this visit:  Essential hypertension -     CBC -     CMP14+EGFR Continue all antihypertensives as prescribed.  Remember to bring in your blood pressure log with you for your follow up appointment.  DASH/Mediterranean Diets are healthier choices for HTN.    Mixed hyperlipidemia -     Lipid panel INSTRUCTIONS: Work on a low fat, heart healthy diet and participate in regular aerobic exercise program by working out at least 150 minutes per week; 5 days a week-30 minutes per day. Avoid red meat, fried foods. junk foods, sodas, sugary drinks, unhealthy snacking, alcohol and smoking.  Drink at least 48oz of water per day and monitor your carbohydrate intake daily.   Hypothyroidism, unspecified type -     TSH    Patient has been counseled on age-appropriate routine health concerns for screening and prevention. These are reviewed and up-to-date. Referrals have been placed accordingly. Immunizations are up-to-date or declined.    Subjective:   Chief Complaint  Patient presents with  . Follow-up    Pt. is here for HTN follow up. Pt. stated he's having shortness of breath.    HPI Larry Thomas 43 y.o. male presents to office today for follow up to HTN and hypothyroidism. He has complaints of intermittent palpitations and left shoulder pain .     Essential Hypertension Chronic and well controlled. Taking Norvasc 5 mg daily as prescribed. He does not monitor his blood pressure at home. Denies chest pain, shortness of breath, palpitations, lightheadedness, dizziness, headaches or BLE edema.  BP Readings from Last 3 Encounters:  11/19/18 122/79  05/17/18 130/85  02/26/18 116/81    Hyperlipidemia Patient presents for follow up to hyperlipidemia.  He is not taking any lipid lowering medication. He is not consistently diet compliant. Lab Results  Component Value Date   CHOL 175 05/18/2017   Lab  Results  Component Value Date   HDL 45 05/18/2017   Lab Results  Component Value Date   LDLCALC 115 (H) 05/18/2017   Lab Results  Component Value Date   TRIG 74 05/18/2017   Lab Results  Component Value Date   CHOLHDL 3.9 05/18/2017    Palpitations Chronic and intermittent. Quit smoking a few years ago after smoking for over 20 years. Palpitations "come and go" lasting seconds to minutes. Associated symptoms: shortness of breath with rest and left shoulder and left sided chest pain when lying on his left side. Relieving factors of chest pain: changing positions and lying on his right side.    Hypothyroidism Taking 300 mcg of synthroid. He denies fatigue, weight changes, heat/cold intolerance, bowel/skin changes. He does endorse intermittent palpitations.  Lab Results  Component Value Date   TSH 4.070 05/17/2018    Review of Systems  Constitutional: Negative for fever, malaise/fatigue and weight loss.  HENT: Negative.  Negative for nosebleeds.   Eyes: Negative.  Negative for blurred vision, double vision and photophobia.  Respiratory: Negative.  Negative for cough and shortness of breath.   Cardiovascular: Positive for chest pain (currently asymptomatic) and palpitations. Negative for leg swelling.  Gastrointestinal: Negative.  Negative for heartburn, nausea and vomiting.  Musculoskeletal: Negative.  Negative for myalgias.  Neurological: Negative.  Negative for dizziness, focal weakness, seizures and headaches.  Psychiatric/Behavioral: Negative.  Negative for suicidal ideas.    Past Medical History:  Diagnosis Date  . Hypercholesterolemia   . Hypertension   .  Hypothyroidism   . Thyroid disease     Past Surgical History:  Procedure Laterality Date  . BILATERAL CARPAL TUNNEL RELEASE Bilateral 04/12/2015   Procedure: BILATERAL CARPAL TUNNEL RELEASE;  Surgeon: Leanora Cover, MD;  Location: [place];  Service: Orthopedics;  Laterality: Bilateral;     Family History  Problem Relation Age of Onset  . Thyroid cancer Mother     Social History Reviewed with no changes to be made today.   Outpatient Medications Prior to Visit  Medication Sig Dispense Refill  . amLODipine (NORVASC) 5 MG tablet Take 1 tablet (5 mg total) by mouth daily. 90 tablet 1  . ibuprofen (ADVIL,MOTRIN) 800 MG tablet Take 1 tablet (800 mg total) by mouth every 8 (eight) hours as needed. 60 tablet 1  . Multiple Vitamins-Minerals (MULTIVITAMIN & MINERAL PO) Take 1 tablet by mouth daily.    . cyclobenzaprine (FLEXERIL) 10 MG tablet Take 1 tablet (10 mg total) by mouth 3 (three) times daily. (Patient not taking: Reported on 08/18/2018) 30 tablet 1  . fluticasone (FLONASE) 50 MCG/ACT nasal spray Place 1 spray into both nostrils daily. (Patient not taking: Reported on 08/18/2018) 16 g 6  . levothyroxine (SYNTHROID) 300 MCG tablet Take 1 tablet (300 mcg total) by mouth daily. 90 tablet 0   No facility-administered medications prior to visit.     No Known Allergies     Objective:    BP 122/79 (BP Location: Left Arm, Patient Position: Sitting, Cuff Size: Large)   Pulse 64   Temp 98.4 F (36.9 C) (Oral)   Ht _0  (1.753 m)   Wt 233 lb (105.7 kg)   SpO2 99%   BMI 34.41 kg/m  Wt Readings from Last 3 Encounters:  11/19/18 233 lb (105.7 kg)  05/17/18 240 lb 6.4 oz (109 kg)  02/26/18 231 lb 6.4 oz (105 kg)    Physical Exam Vitals signs and nursing note reviewed.  Constitutional:      Appearance: He is well-developed.  HENT:     Head: Normocephalic and atraumatic.  Neck:     Musculoskeletal: Normal range of motion.  Cardiovascular:     Rate and Rhythm: Normal rate and regular rhythm.     Heart sounds: Normal heart sounds. No murmur. No friction rub. No gallop.   Pulmonary:     Effort: Pulmonary effort is normal. No tachypnea or respiratory distress.     Breath sounds: Normal breath sounds. No decreased breath sounds, wheezing, rhonchi or rales.  Chest:      Chest wall: No tenderness.  Abdominal:     General: Bowel sounds are normal.     Palpations: Abdomen is soft.  Musculoskeletal: Normal range of motion.  Skin:    General: Skin is warm and dry.  Neurological:     Mental Status: He is alert and oriented to person, place, and time.     Coordination: Coordination normal.  Psychiatric:        Behavior: Behavior normal. Behavior is cooperative.        Thought Content: Thought content normal.        Judgment: Judgment normal.          Patient has been counseled extensively about nutrition and exercise as well as the importance of adherence with medications and regular follow-up. The patient was given clear instructions to go to ER or return to medical center if symptoms don't improve, worsen or new problems develop. The patient verbalized understanding.   Follow-up: No follow-ups  on file.   [name], FNP-BC [facility] [place], [place]   11/19/2018, 11:34 AM

## 2018-11-20 ENCOUNTER — Encounter: Payer: Self-pay | Admitting: Nurse Practitioner

## 2018-11-20 LAB — LIPID PANEL
Chol/HDL Ratio: 4 ratio (ref 0.0–5.0)
Cholesterol, Total: 141 mg/dL (ref 100–199)
HDL: 35 mg/dL — ABNORMAL LOW (ref >39)
LDL Calculated: 80 mg/dL (ref 0–99)
Triglycerides: 129 mg/dL (ref 0–149)
VLDL Cholesterol Cal: 26 mg/dL (ref 5–40)

## 2018-11-20 LAB — CMP14+EGFR
ALT: 26 IU/L (ref 0–44)
AST: 19 IU/L (ref 0–40)
Albumin/Globulin Ratio: 1.6 (ref 1.2–2.2)
Albumin: 4.4 g/dL (ref 4.0–5.0)
Alkaline Phosphatase: 83 IU/L (ref 39–117)
BUN/Creatinine Ratio: 14 (ref 9–20)
BUN: 13 mg/dL (ref 6–24)
Bilirubin Total: 0.3 mg/dL (ref 0.0–1.2)
CO2: 24 mmol/L (ref 20–29)
Calcium: 9.7 mg/dL (ref 8.7–10.2)
Chloride: 106 mmol/L (ref 96–106)
Creatinine, Ser: 0.9 mg/dL (ref 0.76–1.27)
GFR calc Af Amer: 121 mL/min/{1.73_m2} (ref >59)
GFR calc non Af Amer: 104 mL/min/{1.73_m2} (ref >59)
Globulin, Total: 2.7 g/dL (ref 1.5–4.5)
Glucose: 83 mg/dL (ref 65–99)
Potassium: 4.3 mmol/L (ref 3.5–5.2)
Sodium: 142 mmol/L (ref 134–144)
Total Protein: 7.1 g/dL (ref 6.0–8.5)

## 2018-11-20 LAB — TSH: TSH: 0.084 u[IU]/mL — ABNORMAL LOW (ref 0.450–4.500)

## 2018-11-20 LAB — CBC
Hematocrit: 41.9 % (ref 37.5–51.0)
Hemoglobin: 14 g/dL (ref 13.0–17.7)
MCH: 28.2 pg (ref 26.6–33.0)
MCHC: 33.4 g/dL (ref 31.5–35.7)
MCV: 84 fL (ref 79–97)
Platelets: 237 10*3/uL (ref 150–450)
RBC: 4.97 x10E6/uL (ref 4.14–5.80)
RDW: 11.8 % (ref 11.6–15.4)
WBC: 4.2 10*3/uL (ref 3.4–10.8)

## 2018-11-21 ENCOUNTER — Other Ambulatory Visit: Payer: Self-pay | Admitting: Nurse Practitioner

## 2018-11-21 DIAGNOSIS — E039 Hypothyroidism, unspecified: Secondary | ICD-10-CM

## 2018-11-21 MED ORDER — LEVOTHYROXINE SODIUM 200 MCG PO TABS: 200.0000 ug | ORAL_TABLET | Freq: Every day | ORAL | 0 refills | Status: DC

## 2018-11-22 MED FILL — ?LEVOTHYROXINE 200 MCG TAB: 200 MCG | 30 days supply | Qty: 30 | Fill #0

## 2018-12-03 MED FILL — ?LEVOTHYROXINE 200 MCG TAB: 200 MCG | 30 days supply | Qty: 30 | Fill #0

## 2018-12-07 ENCOUNTER — Ambulatory Visit: Payer: Self-pay | Attending: Family Medicine

## 2018-12-21 ENCOUNTER — Telehealth: Payer: Self-pay | Admitting: Nurse Practitioner

## 2018-12-21 NOTE — Telephone Encounter (Signed)
Pt was sent a letter from financial dept. Inform them, that the application they submitted was incomplete, since they were missing some documentation at the time of the appointment, Pt need to reschedule and resubmit all new papers and application for [employer] and [employer], P.S. old documents has been sent back by mail to the Pt and Pt. need to make a new appt. 

## 2019-01-03 MED FILL — AMLODIPINE BESYLATE 5 MG TA: 5 | 30 days supply | Qty: 30 | Fill #3

## 2019-01-03 MED FILL — ?LEVOTHYROXINE 200 MCG TAB: 200 MCG | 30 days supply | Qty: 30 | Fill #1

## 2019-02-10 MED FILL — ?LEVOTHYROXINE 200 MCG TAB: 200 MCG | 30 days supply | Qty: 30 | Fill #2

## 2019-02-10 MED FILL — AMLODIPINE BESYLATE 5 MG TA: 5 | 30 days supply | Qty: 30 | Fill #4

## 2019-02-21 ENCOUNTER — Ambulatory Visit: Payer: Medicaid Other | Attending: Nurse Practitioner | Admitting: Nurse Practitioner

## 2019-02-21 ENCOUNTER — Other Ambulatory Visit: Payer: Self-pay

## 2019-02-21 ENCOUNTER — Encounter: Payer: Self-pay | Admitting: Nurse Practitioner

## 2019-02-21 DIAGNOSIS — Z87891 Personal history of nicotine dependence: Secondary | ICD-10-CM | POA: Diagnosis not present

## 2019-02-21 DIAGNOSIS — E7800 Pure hypercholesterolemia, unspecified: Secondary | ICD-10-CM | POA: Diagnosis not present

## 2019-02-21 DIAGNOSIS — E039 Hypothyroidism, unspecified: Secondary | ICD-10-CM | POA: Diagnosis not present

## 2019-02-21 DIAGNOSIS — I10 Essential (primary) hypertension: Secondary | ICD-10-CM | POA: Diagnosis not present

## 2019-02-21 DIAGNOSIS — Z79899 Other long term (current) drug therapy: Secondary | ICD-10-CM | POA: Diagnosis not present

## 2019-02-21 DIAGNOSIS — Z9189 Other specified personal risk factors, not elsewhere classified: Secondary | ICD-10-CM

## 2019-02-21 MED ORDER — POWDER FREE NITRILE GLOVES XL MISC: 1.0000 | Freq: Every day | 99 refills | Status: DC | PRN

## 2019-02-21 MED ORDER — BLOOD PRESSURE MONITOR DEVI: 0 refills | Status: AC

## 2019-02-21 MED ORDER — MASKS MISC: 1.0000 | Freq: Every day | 99 refills | Status: DC | PRN

## 2019-02-21 NOTE — Progress Notes (Signed)
Virtual Visit via Telephone Note Due to national recommendations of social distancing due to COVID 19, telehealth visit is felt to be most appropriate for this patient at this time.  I discussed the limitations, risks, security and privacy concerns of performing an evaluation and management service by telephone and the availability of in person appointments. I also discussed with the patient that there may be a patient responsible charge related to this service. The patient expressed understanding and agreed to proceed.    I connected with Larry Thomas on 02/21/19  at  11:10 AM EST  EDT by telephone and verified that I am speaking with the correct person using two identifiers.   Consent I discussed the limitations, risks, security and privacy concerns of performing an evaluation and management service by telephone and the availability of in person appointments. I also discussed with the patient that there may be a patient responsible charge related to this service. The patient expressed understanding and agreed to proceed.   Location of Patient: Private Residence   [facility] of Provider: [facility]    Persons participating in Telemedicine visit: Larry Denver FNP-BC YY Anderson CMA Larry Thomas    History of Present Illness: Telemedicine visit for: HTN and Hypothyroidism  Essential Hypertension Chronic and well controlled.  He does not own a blood pressure device so unable to monitor his blood pressures at home. Will order a monitor through his insurance plan.Denies chest pain, shortness of breath, palpitations, lightheadedness, dizziness, headaches or BLE edema.  BP Readings from Last 3 Encounters:  11/19/18 122/79  05/17/18 130/85  02/26/18 116/81     Hypothyroidism He endorses medication compliance taking levothyroxine 200 mcg daily.  He tries to take this medication at the same time every morning and does not take it with any other  medications.  Patient denies change in energy level, diarrhea, heat / cold intolerance, nervousness, palpitations and weight changes.  Lab Results  Component Value Date   TSH 0.084 (L) 11/19/2018     Past Medical History:  Diagnosis Date  . Hypercholesterolemia   . Hypertension   . Hypothyroidism   . Thyroid disease     Past Surgical History:  Procedure Laterality Date  . BILATERAL CARPAL TUNNEL RELEASE Bilateral 04/12/2015   Procedure: BILATERAL CARPAL TUNNEL RELEASE;  Surgeon: Betha Loa, MD;  Location: [facility];  Service: Orthopedics;  Laterality: Bilateral;    Family History  Problem Relation Age of Onset  . Thyroid cancer Mother     Social History   Socioeconomic History  . Marital status: Married    Spouse name: Not on file  . Number of children: Not on file  . Years of education: Not on file  . Highest education level: Not on file  Occupational History  . Not on file  Social Needs  . Financial resource strain: Not on file  . Food insecurity    Worry: Not on file    Inability: Not on file  . Transportation needs    Medical: Not on file    Non-medical: Not on file  Tobacco Use  . Smoking status: Former Games developer  . Smokeless tobacco: Never Used  Substance and Sexual Activity  . Alcohol use: No  . Drug use: No  . Sexual activity: Yes  Lifestyle  . Physical activity    Days per week: Not on file    Minutes per session: Not on file  . Stress: Not on file  Relationships  .  Social Herbalist on phone: Not on file    Gets together: Not on file    Attends religious service: Not on file    Active member of club or organization: Not on file    Attends meetings of clubs or organizations: Not on file    Relationship status: Not on file  Other Topics Concern  . Not on file  Social History Narrative   Patient is married.      Observations/Objective: Awake, alert and oriented x 3   Review of Systems  Constitutional: Negative for  fever, malaise/fatigue and weight loss.  HENT: Negative.  Negative for nosebleeds.   Eyes: Negative.  Negative for blurred vision, double vision and photophobia.  Respiratory: Negative.  Negative for cough and shortness of breath.   Cardiovascular: Negative.  Negative for chest pain, palpitations and leg swelling.  Gastrointestinal: Negative.  Negative for heartburn, nausea and vomiting.  Musculoskeletal: Negative.  Negative for myalgias.  Neurological: Negative.  Negative for dizziness, focal weakness, seizures and headaches.  Psychiatric/Behavioral: Negative.  Negative for suicidal ideas.    Assessment and Plan: [place] was seen today for follow-up.  Diagnoses and all orders for this visit:  Essential hypertension -     Blood Pressure Monitor DEVI; Please provide patient with insurance approved blood pressure monitor ICD 10 I10.0 Continue all antihypertensives as prescribed.  Remember to bring in your blood pressure log with you for your follow up appointment.  DASH/Mediterranean Diets are healthier choices for HTN.    Acquired hypothyroidism -     TSH; Future  At increased risk of exposure to COVID-19 virus -     Disposable Gloves (POWDER FREE NITRILE GLOVES XL) MISC; 1 each by Does not apply route daily as needed. Please provide patient with insurance approved gloves due to COVID 19. Z91.89 -     Masks MISC; 1 each by Does not apply route daily as needed. Please provide patient with insurance approved masks due to COVID 19. Z91.89     Follow Up Instructions Return in about 3 months (around 05/22/2019) for HTN, Thyroid.     I discussed the assessment and treatment plan with the patient. The patient was provided an opportunity to ask questions and all were answered. The patient agreed with the plan and demonstrated an understanding of the instructions.   The patient was advised to call back or seek an in-person evaluation if the symptoms worsen or if the condition fails to  improve as anticipated.  I provided 17 minutes of non-face-to-face time during this encounter including median intraservice time, reviewing previous notes, labs, imaging, medications and explaining diagnosis and management.  [name], FNP-BC

## 2019-02-23 ENCOUNTER — Telehealth: Payer: Self-pay

## 2019-02-23 NOTE — Telephone Encounter (Signed)
CMA faxed the medical supplies to [facility]:  Supplies: Blood Pressure Monitor Device Disposable Gloves Masks

## 2019-02-25 ENCOUNTER — Ambulatory Visit: Payer: Medicaid Other | Attending: Nurse Practitioner

## 2019-02-25 ENCOUNTER — Other Ambulatory Visit: Payer: Self-pay

## 2019-02-25 DIAGNOSIS — E039 Hypothyroidism, unspecified: Secondary | ICD-10-CM

## 2019-02-26 LAB — TSH: TSH: 13.5 u[IU]/mL — ABNORMAL HIGH (ref 0.450–4.500)

## 2019-03-01 ENCOUNTER — Other Ambulatory Visit: Payer: Self-pay | Admitting: Nurse Practitioner

## 2019-03-01 DIAGNOSIS — E039 Hypothyroidism, unspecified: Secondary | ICD-10-CM

## 2019-03-01 MED ORDER — LEVOTHYROXINE SODIUM 175 MCG PO TABS: 175.0000 ug | ORAL_TABLET | Freq: Every day | ORAL | 1 refills | Status: DC

## 2019-03-02 MED FILL — ?LEVOTHYROXINE 175 MCG TAB: 175 | 30 days supply | Qty: 30 | Fill #0

## 2019-04-04 MED FILL — LEVOTHYROXINE 175 MCG TAB: 175 | 30 days supply | Qty: 30 | Fill #1

## 2019-04-04 MED FILL — AMLODIPINE BESYLATE 5 MG TA: 5 | 30 days supply | Qty: 30 | Fill #5

## 2019-04-05 ENCOUNTER — Telehealth: Payer: Self-pay | Admitting: Pharmacist

## 2019-04-05 NOTE — Telephone Encounter (Signed)
Received notice from [facility] pharmacy that we need to change manufacturers of levothyroxine d/t availability. Will forward to PCP to let her know. Recommend follow-up thyroid function tests in 6 weeks.  

## 2019-04-06 NOTE — Telephone Encounter (Signed)
Thanks [employer]. What should I order now for the patients?

## 2019-04-06 NOTE — Telephone Encounter (Signed)
You're welcome! Continue to order just generic levothyroxine.   It's one of those medications that we have to notify the provider every time we change manufacturers because it can alter levels.

## 2019-05-02 ENCOUNTER — Ambulatory Visit: Payer: Medicaid Other | Attending: Nurse Practitioner | Admitting: Nurse Practitioner

## 2019-05-02 ENCOUNTER — Other Ambulatory Visit: Payer: Self-pay

## 2019-05-02 ENCOUNTER — Other Ambulatory Visit: Payer: Self-pay | Admitting: Nurse Practitioner

## 2019-05-02 DIAGNOSIS — I10 Essential (primary) hypertension: Secondary | ICD-10-CM | POA: Diagnosis not present

## 2019-05-02 DIAGNOSIS — E039 Hypothyroidism, unspecified: Secondary | ICD-10-CM

## 2019-05-02 DIAGNOSIS — E782 Mixed hyperlipidemia: Secondary | ICD-10-CM | POA: Diagnosis not present

## 2019-05-02 MED FILL — LEVOTHYROXINE SODIUM 175 MC: 175 | 30 days supply | Qty: 30 | Fill #0

## 2019-05-02 MED FILL — AMLODIPINE BESYLATE 5 MG TA: 5 | 30 days supply | Qty: 30 | Fill #1

## 2019-05-03 LAB — CMP14+EGFR
ALT: 17 IU/L (ref 0–44)
AST: 20 IU/L (ref 0–40)
Albumin/Globulin Ratio: 1.8 (ref 1.2–2.2)
Albumin: 4.4 g/dL (ref 4.0–5.0)
Alkaline Phosphatase: 73 IU/L (ref 39–117)
BUN/Creatinine Ratio: 10 (ref 9–20)
BUN: 9 mg/dL (ref 6–24)
Bilirubin Total: 0.4 mg/dL (ref 0.0–1.2)
CO2: 21 mmol/L (ref 20–29)
Calcium: 9.3 mg/dL (ref 8.7–10.2)
Chloride: 103 mmol/L (ref 96–106)
Creatinine, Ser: 0.94 mg/dL (ref 0.76–1.27)
GFR calc Af Amer: 114 mL/min/{1.73_m2} (ref >59)
GFR calc non Af Amer: 98 mL/min/{1.73_m2} (ref >59)
Globulin, Total: 2.4 g/dL (ref 1.5–4.5)
Glucose: 78 mg/dL (ref 65–99)
Potassium: 4.1 mmol/L (ref 3.5–5.2)
Sodium: 140 mmol/L (ref 134–144)
Total Protein: 6.8 g/dL (ref 6.0–8.5)

## 2019-05-03 LAB — CBC
Hematocrit: 41.5 % (ref 37.5–51.0)
Hemoglobin: 13.8 g/dL (ref 13.0–17.7)
MCH: 28.5 pg (ref 26.6–33.0)
MCHC: 33.3 g/dL (ref 31.5–35.7)
MCV: 86 fL (ref 79–97)
Platelets: 234 x10E3/uL (ref 150–450)
RBC: 4.85 x10E6/uL (ref 4.14–5.80)
RDW: 12.2 % (ref 11.6–15.4)
WBC: 5 x10E3/uL (ref 3.4–10.8)

## 2019-05-03 LAB — LIPID PANEL
Chol/HDL Ratio: 3.8 ratio (ref 0.0–5.0)
Cholesterol, Total: 162 mg/dL (ref 100–199)
HDL: 43 mg/dL (ref >39)
LDL Chol Calc (NIH): 106 mg/dL — ABNORMAL HIGH (ref 0–99)
Triglycerides: 65 mg/dL (ref 0–149)
VLDL Cholesterol Cal: 13 mg/dL (ref 5–40)

## 2019-05-03 LAB — TSH: TSH: 11.9 u[IU]/mL — ABNORMAL HIGH (ref 0.450–4.500)

## 2019-05-04 DIAGNOSIS — I10 Essential (primary) hypertension: Secondary | ICD-10-CM | POA: Diagnosis not present

## 2019-06-03 ENCOUNTER — Emergency Department (HOSPITAL_COMMUNITY)
Admission: EM | Admit: 2019-06-03 | Discharge: 2019-06-03 | Disposition: A | Discharge status: Home / Self Care | Payer: Medicaid Other | Attending: Emergency Medicine | Admitting: Emergency Medicine

## 2019-06-03 ENCOUNTER — Emergency Department (HOSPITAL_COMMUNITY): Payer: Medicaid Other

## 2019-06-03 ENCOUNTER — Other Ambulatory Visit: Payer: Self-pay

## 2019-06-03 ENCOUNTER — Encounter (HOSPITAL_COMMUNITY): Payer: Self-pay

## 2019-06-03 ENCOUNTER — Other Ambulatory Visit: Payer: Self-pay | Admitting: Nurse Practitioner

## 2019-06-03 DIAGNOSIS — R110 Nausea: Secondary | ICD-10-CM

## 2019-06-03 DIAGNOSIS — I10 Essential (primary) hypertension: Secondary | ICD-10-CM | POA: Diagnosis not present

## 2019-06-03 DIAGNOSIS — Z87891 Personal history of nicotine dependence: Secondary | ICD-10-CM | POA: Diagnosis not present

## 2019-06-03 DIAGNOSIS — N200 Calculus of kidney: Secondary | ICD-10-CM | POA: Diagnosis not present

## 2019-06-03 DIAGNOSIS — E039 Hypothyroidism, unspecified: Secondary | ICD-10-CM | POA: Insufficient documentation

## 2019-06-03 DIAGNOSIS — R109 Unspecified abdominal pain: Secondary | ICD-10-CM | POA: Insufficient documentation

## 2019-06-03 DIAGNOSIS — R42 Dizziness and giddiness: Secondary | ICD-10-CM | POA: Insufficient documentation

## 2019-06-03 DIAGNOSIS — Z7982 Long term (current) use of aspirin: Secondary | ICD-10-CM | POA: Insufficient documentation

## 2019-06-03 DIAGNOSIS — Z79899 Other long term (current) drug therapy: Secondary | ICD-10-CM | POA: Insufficient documentation

## 2019-06-03 DIAGNOSIS — I700 Atherosclerosis of aorta: Secondary | ICD-10-CM | POA: Diagnosis not present

## 2019-06-03 DIAGNOSIS — R079 Chest pain, unspecified: Secondary | ICD-10-CM | POA: Diagnosis not present

## 2019-06-03 DIAGNOSIS — R0789 Other chest pain: Secondary | ICD-10-CM | POA: Diagnosis not present

## 2019-06-03 LAB — CBC
HCT: 43.3 % (ref 39.0–52.0)
Hemoglobin: 14.4 g/dL (ref 13.0–17.0)
MCH: 28.7 pg (ref 26.0–34.0)
MCHC: 33.3 g/dL (ref 30.0–36.0)
MCV: 86.4 fL (ref 80.0–100.0)
Platelets: 245 10*3/uL (ref 150–400)
RBC: 5.01 MIL/uL (ref 4.22–5.81)
RDW: 12 % (ref 11.5–15.5)
WBC: 6.6 10*3/uL (ref 4.0–10.5)
nRBC: 0 % (ref 0.0–0.2)

## 2019-06-03 LAB — BASIC METABOLIC PANEL
Anion gap: 9 (ref 5–15)
BUN: 15 mg/dL (ref 6–20)
CO2: 26 mmol/L (ref 22–32)
Calcium: 9.5 mg/dL (ref 8.9–10.3)
Chloride: 103 mmol/L (ref 98–111)
Creatinine, Ser: 0.96 mg/dL (ref 0.61–1.24)
GFR calc Af Amer: >60 mL/min (ref >60)
GFR calc non Af Amer: >60 mL/min (ref >60)
Glucose, Bld: 75 mg/dL (ref 70–99)
Potassium: 3.8 mmol/L (ref 3.5–5.1)
Sodium: 138 mmol/L (ref 135–145)

## 2019-06-03 LAB — URINALYSIS, ROUTINE W REFLEX MICROSCOPIC
Bilirubin Urine: NEGATIVE
Glucose, UA: NEGATIVE mg/dL
Hgb urine dipstick: NEGATIVE
Ketones, ur: NEGATIVE mg/dL
Leukocytes,Ua: NEGATIVE
Nitrite: NEGATIVE
Protein, ur: NEGATIVE mg/dL
Specific Gravity, Urine: 1.025 (ref 1.005–1.030)
pH: 7 (ref 5.0–8.0)

## 2019-06-03 LAB — D-DIMER, QUANTITATIVE: D-Dimer, Quant: <0.27 ug/mL-FEU (ref 0.00–0.50)

## 2019-06-03 LAB — TSH: TSH: 2.171 u[IU]/mL (ref 0.350–4.500)

## 2019-06-03 LAB — TROPONIN I (HIGH SENSITIVITY)
Troponin I (High Sensitivity): <2 ng/L (ref <18)
Troponin I (High Sensitivity): <2 ng/L (ref <18)

## 2019-06-03 LAB — CBG MONITORING, ED: Glucose-Capillary: 157 mg/dL — ABNORMAL HIGH (ref 70–99)

## 2019-06-03 MED ORDER — SODIUM CHLORIDE 0.9 % IV BOLUS
1000.0000 mL | Freq: Once | INTRAVENOUS | Status: AC
  Administered 2019-06-03: 1000 mL via INTRAVENOUS

## 2019-06-03 MED ORDER — ONDANSETRON HCL 4 MG/2ML IJ SOLN
4.0000 mg | Freq: Once | INTRAMUSCULAR | Status: AC
  Administered 2019-06-03: 4 mg via INTRAVENOUS
  Filled 2019-06-03: qty 2

## 2019-06-03 MED ORDER — SODIUM CHLORIDE 0.9% FLUSH: 3.0000 mL | Freq: Once | INTRAVENOUS | Status: DC

## 2019-06-03 MED ORDER — SODIUM CHLORIDE (PF) 0.9 % IJ SOLN
INTRAMUSCULAR | Status: AC
  Filled 2019-06-03: qty 50

## 2019-06-03 MED ORDER — IOHEXOL 350 MG/ML SOLN
100.0000 mL | Freq: Once | INTRAVENOUS | Status: AC | PRN
  Administered 2019-06-03: 100 mL via INTRAVENOUS

## 2019-06-03 MED FILL — LEVOTHYROXINE SODIUM 175 MC: 175 | 30 days supply | Qty: 30 | Fill #1

## 2019-06-03 MED FILL — AMLODIPINE BESYLATE 5 MG TA: 5 | 30 days supply | Qty: 30 | Fill #0

## 2019-06-03 NOTE — ED Provider Notes (Signed)
Care handoff received from [facility] abdomen PA-C at shift change please see her note for full details.  In short 44 year old male presents today for palpitations dizziness and left-sided chest pain.  Initial work-up is reassuring, at shift change CT dissection study and delta troponin is pending.  Plan of care is pending no acute findings or elevation of troponin discharge with cardiology referral. Physical Exam  BP 128/75   Pulse 83   Temp 98.3 F (36.8 C) (Oral)   Resp 17   Ht 5\' 8"  (1.727 m)   Wt 108.9 kg   SpO2 100%   BMI 36.49 kg/m   Physical Exam Constitutional:      General: He is not in acute distress.    Appearance: Normal appearance. He is well-developed. He is not ill-appearing or diaphoretic.  HENT:     Head: Normocephalic and atraumatic.     Right Ear: External ear normal.     Left Ear: External ear normal.     Nose: Nose normal.  Eyes:     General: Vision grossly intact. Gaze aligned appropriately.     Pupils: Pupils are equal, round, and reactive to light.  Neck:     Trachea: Trachea and phonation normal. No tracheal deviation.  Pulmonary:     Effort: Pulmonary effort is normal. No respiratory distress.  Chest:     Chest wall: No deformity or crepitus.  Abdominal:     General: There is no distension.     Palpations: Abdomen is soft.     Tenderness: There is no abdominal tenderness. There is no guarding or rebound.  Musculoskeletal:        General: Normal range of motion.     Cervical back: Normal range of motion.  Skin:    General: Skin is warm and dry.  Neurological:     Mental Status: He is alert.     GCS: GCS eye subscore is 4. GCS verbal subscore is 5. GCS motor subscore is 6.     Comments: Speech is clear and goal oriented, follows commands Major Cranial nerves without deficit, no facial droop Moves extremities without ataxia, coordination intact  Psychiatric:        Behavior: Behavior normal.     ED Course/Procedures   Clinical Course as of  Jun 03 1543  Fri Jun 03, 2019  218 44 year old male with history of hypertension complaining of palpitations along with some stabbing chest pain and feeling lightheaded like he might pass out starting last evening.  Worsened acutely today.  Denies any provoking incident.  Has had the palpitations before but never these other symptoms with it.  Tachycardic on arrival sats 100% on room air.  Getting labs EKG chest x-ray.  Disposition per results of testing.   [MB]  1247 Pulse Rate(!): 110 [CA]  1354 Sinus rhythm with normal intervals no acute ST-T changes.   [MB]    Clinical Course User Index [CA] 59, PA-C [MB] Mannie Stabile, MD    Procedures  ECG reviewed, rate 99, questionable right atrial enlargement.  No acute ischemic changes. Reviewed with Dr. Terrilee Files  MDM   CBC within normal limits no leukocytosis to suggest infection, no evidence of anemia.  D-dimer negative low suspicion for pulmonary embolism.  High-sensitivity troponin negative x2 indication for triple troponin.  BMP within normal limits no emergent electrolyte derangement or evidence of kidney injury.  Urinalysis within limits no evidence of infection and no hemoglobin to suggest stone disease.  CBG 137, no  evidence of DKA.  TSH within normal limits no evidence of hypo-/hyperthyroidism.  Chest x-ray shows no acute abnormalities, personally viewed patient's chest x-ray and agree with radiologist interpretation.  CT chest abdomen pelvis dissection study shows no acute abnormalities, evidence of thoracic aortic aneurysm or dissection or evidence of pulmonary emboli.  There is note of a punctate nonobstructing left renal calculus and atherosclerosis.  Additionally pancreatitis noted to be unremarkable on CT scan today.  Reassuring work-up today.  I agree with previous team that patient symptoms today are atypical.  No evidence of dissection, PE or ACS at this time. Patient reassessed he is resting comfortably no acute  distress and denies any pain.  Vital signs stable no tachycardia, hypoxia or hypotension. He is aware of work-up as above and states understanding, he is agreeable to discharge with PCP follow-up and cardiology referral.  At this time there does not appear to be any evidence of an acute emergency medical condition and the patient appears stable for discharge with appropriate outpatient follow up. Diagnosis was discussed with patient who verbalizes understanding of care plan and is agreeable to discharge. I have discussed return precautions with patient who verbalizes understanding of return precautions. Patient encouraged to follow-up with their PCP and cardiology. All questions answered.   Note: Portions of this report may have been transcribed using voice recognition software. Every effort was made to ensure accuracy; however, inadvertent computerized transcription errors may still be present.   Deliah Boston, PA-C 06/03/19 1714    Daleen Bo, MD 06/04/19 1037

## 2019-06-03 NOTE — Discharge Instructions (Addendum)
You have been diagnosed today with Atypical Chest Pain, nausea, dizziness.  At this time there does not appear to be the presence of an emergent medical condition, however there is always the potential for conditions to change. Please read and follow the below instructions.  Please return to the Emergency Department immediately for any new or worsening symptoms. Please be sure to follow up with your Primary Care Provider within one week regarding your visit today; please call their office to schedule an appointment even if you are feeling better for a follow-up visit. Your CT scan today showed aortic atherosclerosis and a small left-sided kidney stone within your kidney.  Please discuss these incidental findings with your primary care provider at your follow-up visit. You have been given referral to a cardiologist, call their office to schedule a follow-up appointment for reevaluation.  Get help right away if: Your chest pain is worse. You have a cough that gets worse, or you cough up blood. You have very bad (severe) pain in your belly (abdomen). You pass out (faint). You have either of these for no clear reason: Sudden chest discomfort. Sudden discomfort in your arms, back, neck, or jaw. You have shortness of breath at any time. You suddenly start to sweat, or your skin gets clammy. You feel sick to your stomach (nauseous). You throw up (vomit). You suddenly feel lightheaded or dizzy. You feel very weak or tired. Your heart starts to beat fast, or it feels like it is skipping beats. You have any new/concerning or worsening of symptoms  Please read the additional information packets attached to your discharge summary.  Do not take your medicine if  develop an itchy rash, swelling in your mouth or lips, or difficulty breathing; call 911 and seek immediate emergency medical attention if this occurs.  Note: Portions of this text may have been transcribed using voice recognition software.  Every effort was made to ensure accuracy; however, inadvertent computerized transcription errors may still be present.

## 2019-06-03 NOTE — ED Triage Notes (Signed)
Pt presents with c/o palpitations and dizziness that started this morning. Pt also c/o nausea. Pt reports he has been very dizzy this morning and has been having some pains on the left side of his chest that come and go.

## 2019-06-03 NOTE — ED Provider Notes (Signed)
[facility] DEPT Provider Note   CSN: [id number] Arrival date & time: 06/03/19  1104     History Chief Complaint  Patient presents with  . Dizziness  . Chest Pain    Larry Thomas is a 44 y.o. male with a past medical history significant for hyperlipidemia, hypertension, and hypothyroidism who presents to the ED due to sudden onset of dizziness and palpitations that started this morning.  Patient states whenever he stands up or makes any movement he feels "drunk".  He describes his palpitations as increase in heart rate but typically lasts around 30 seconds associated with shortness of breath.  Has a history of palpitations.  Patient also admits to left-sided chest pain which he describes as a poking sensation that eventually turns into a burning sensation.  Denies overlying rash. He notes pain sometimes radiates to his right side as well, but never centrally located. Worse with exertion. Just recently stopped smoking 1 month ago. Admits to a family history of early CAD on his mother's side. Unsure about father's side. He also admits to nausea, but denies abdominal pain, vomiting, and diarrhea. Denies headache, fever, chills.  Denies recent illness.  Denies lower extremity edema.  Denies history of blood clots, recent surgeries, recent long immobilizations, and hormonal treatments.  History obtained from patient and past medical records. No interpreter used during encounter.      Past Medical History:  Diagnosis Date  . Hypercholesterolemia   . Hypertension   . Hypothyroidism   . Thyroid disease     Patient Active Problem List   Diagnosis Date Noted  . Mixed hyperlipidemia 05/18/2017  . Essential hypertension 05/18/2017    Past Surgical History:  Procedure Laterality Date  . BILATERAL CARPAL TUNNEL RELEASE Bilateral 04/12/2015   Procedure: BILATERAL CARPAL TUNNEL RELEASE;  Surgeon: Leanora Cover, MD;  Location: [facility];  Service:  Orthopedics;  Laterality: Bilateral;       Family History  Problem Relation Age of Onset  . Thyroid cancer Mother     Social History   Tobacco Use  . Smoking status: Former Research scientist (life sciences)  . Smokeless tobacco: Never Used  Substance Use Topics  . Alcohol use: No  . Drug use: No    Home Medications Prior to Admission medications   Medication Sig Start Date End Date Taking? Authorizing Provider  amLODipine (NORVASC) 5 MG tablet TAKE 1 TABLET (5 MG TOTAL) BY MOUTH DAILY. 06/03/19  Yes Charlott Rakes, MD  aspirin 81 MG chewable tablet Chew 81 mg by mouth daily as needed for mild pain.   Yes [provider]  ibuprofen (ADVIL) 200 MG tablet Take 800 mg by mouth every 6 (six) hours as needed for moderate pain.   Yes [provider]  levothyroxine (SYNTHROID) 175 MCG tablet TAKE 1 TABLET (175 MCG TOTAL) BY MOUTH DAILY. 05/02/19 06/03/19 Yes Charlott Rakes, MD  Multiple Vitamin (MULTIVITAMIN WITH MINERALS) TABS tablet Take 1 tablet by mouth daily.   Yes [provider]  Blood Pressure Monitor DEVI Please provide patient with insurance approved blood pressure monitor ICD 10 I10.0 02/21/19   [name], NP  cyclobenzaprine (FLEXERIL) 10 MG tablet Take 1 tablet (10 mg total) by mouth 3 (three) times daily. Patient not taking: Reported on 08/18/2018 05/18/17   [name], NP  Disposable Gloves (POWDER FREE NITRILE GLOVES XL) MISC 1 each by Does not apply route daily as needed. Please provide patient with insurance approved gloves due to COVID 19.  Z91.89 02/21/19   Claiborne Rigg, NP  fluticasone (FLONASE) 50 MCG/ACT nasal spray Place 1 spray into both nostrils daily. Patient not taking: Reported on 08/18/2018 05/18/17   Claiborne Rigg, NP  ibuprofen (ADVIL,MOTRIN) 800 MG tablet Take 1 tablet (800 mg total) by mouth every 8 (eight) hours as needed. Patient not taking: Reported on 06/03/2019 09/28/17   Claiborne Rigg, NP  Masks MISC 1 each by Does not apply route daily  as needed. Please provide patient with insurance approved masks due to COVID 19. Z91.89 02/21/19   Claiborne Rigg, NP    Allergies    Patient has no known allergies.  Review of Systems   Review of Systems  Constitutional: Negative for chills and fever.  Respiratory: Positive for shortness of breath. Negative for cough.   Cardiovascular: Positive for chest pain. Negative for leg swelling.  Gastrointestinal: Positive for nausea. Negative for abdominal pain, diarrhea and vomiting.  Genitourinary: Negative for dysuria.  Musculoskeletal: Negative for back pain.  Neurological: Positive for dizziness. Negative for speech difficulty, weakness and headaches.  All other systems reviewed and are negative.   Physical Exam Updated Vital Signs BP 128/75   Pulse 83   Temp 98.3 F (36.8 C) (Oral)   Resp 17   Ht 5\' 8"  (1.727 m)   Wt 108.9 kg   SpO2 100%   BMI 36.49 kg/m   Physical Exam Vitals and nursing note reviewed.  Constitutional:      General: He is not in acute distress.    Appearance: He is not toxic-appearing.  HENT:     Head: Normocephalic.  Eyes:     Extraocular Movements: Extraocular movements intact.     Pupils: Pupils are equal, round, and reactive to light.  Cardiovascular:     Rate and Rhythm: Normal rate and regular rhythm.     Pulses: Normal pulses.     Heart sounds: Normal heart sounds. No murmur. No friction rub. No gallop.   Pulmonary:     Effort: Pulmonary effort is normal.     Breath sounds: Normal breath sounds.  Chest:     Comments: No tenderness to palpation throughout anterior chest wall Abdominal:     General: Abdomen is flat. Bowel sounds are normal. There is no distension.     Palpations: Abdomen is soft.     Tenderness: There is no abdominal tenderness. There is no guarding or rebound.  Musculoskeletal:     Cervical back: Neck supple.     Comments: Able to move all 4 extremities without difficulty.  No lower extremity edema.  Negative Homans  sign bilaterally.  Skin:    General: Skin is warm and dry.     Findings: No rash.  Neurological:     General: No focal deficit present.     Mental Status: He is alert.     Comments: Speech is clear, able to follow commands CN III-XII intact Normal strength in upper and lower extremities bilaterally including dorsiflexion and plantar flexion, strong and equal grip strength Sensation grossly intact throughout Moves extremities without ataxia, coordination intact No pronator drift   Psychiatric:        Mood and Affect: Mood normal.        Behavior: Behavior normal.     ED Results / Procedures / Treatments   Labs (all labs ordered are listed, but only abnormal results are displayed) Labs Reviewed  CBG MONITORING, ED - Abnormal; Notable for the following components:  Result Value   Glucose-Capillary 157 (*)    All other components within normal limits  BASIC METABOLIC PANEL  CBC  URINALYSIS, ROUTINE W REFLEX MICROSCOPIC  D-DIMER, QUANTITATIVE (NOT AT [facility])  TSH  TROPONIN I (HIGH SENSITIVITY)  TROPONIN I (HIGH SENSITIVITY)    EKG None  Radiology DG Chest 2 View  Result Date: 06/03/2019 CLINICAL DATA:  Chest pain EXAM: CHEST - 2 VIEW COMPARISON:  March 29, 2017 FINDINGS: Lungs are clear. The heart size and pulmonary vascularity are normal. No adenopathy. No pneumothorax. No bone lesions. IMPRESSION: No abnormality noted. Electronically Signed   By: Bretta Bang III M.D.   On: 06/03/2019 12:19    Procedures Procedures (including critical care time)  Medications Ordered in ED Medications  sodium chloride flush (NS) 0.9 % injection 3 mL (has no administration in time range)  sodium chloride flush (NS) 0.9 % injection 3 mL (has no administration in time range)  sodium chloride 0.9 % bolus 1,000 mL (0 mLs Intravenous Stopped 06/03/19 1521)  ondansetron (ZOFRAN) injection 4 mg (4 mg Intravenous Given 06/03/19 1320)    ED Course  I have reviewed the triage vital  signs and the nursing notes.  Pertinent labs & imaging results that were available during my care of the patient were reviewed by me and considered in my medical decision making (see chart for details).  Clinical Course as of Jun 03 1527  Fri Jun 03, 2019  [id number] 44 year old male with history of hypertension complaining of palpitations along with some stabbing chest pain and feeling lightheaded like he might pass out starting last evening.  Worsened acutely today.  Denies any provoking incident.  Has had the palpitations before but never these other symptoms with it.  Tachycardic on arrival sats 100% on room air.  Getting labs EKG chest x-ray.  Disposition per results of testing.   [MB]  1247 Pulse Rate(!): 110 [CA]  1354 Sinus rhythm with normal intervals no acute ST-T changes.   [MB]    Clinical Course User Index [CA] Mannie Stabile, PA-C [MB] Terrilee Files, MD   MDM Rules/Calculators/A&P                     44 year old male with a past medical history significant for hypertension, hypothyroidism, and hyperlipidemia who presents to the ED for an evaluation of palpitations associated with left-sided chest pain and dizziness.  Previous history of palpitations.  Chart reviewed.  Patient had an echocardiogram on 05/20/2018 which demonstrated normal EF.  Upon arrival, patient afebrile, tachycardic at 110 with O2 saturation at 100% on room air. Patient in no acute distress and non-toxic appearing. Will obtain cardiac labs and d-dimer to rule out cardiac etiology. Chest pain appears atypical in nature.   Chest x-ray personally reviewed which is negative for signs of pneumonia, pneumothorax, or widened mediastinum.  He unremarkable normal renal function and no electrolyte derangements.  Normal D-dimer.  Doubt PE.  CBC unremarkable with no leukocytosis.  UA unremarkable with no signs of infection or hematuria.  EKG personally reviewed which demonstrates normal sinus rhythm with no signs of acute  ischemia. Initial troponin normal. Will obtain delta troponin.  3:23 PM reassessed patient at bedside. He notes he still has intermittent chest pain. Given patient is still symptomatic, will obtain CTA chest to rule out dissection. Patient also just informed RN that his thyroid medication has recently been changed. Will place order for TSH.  Patient handed off to Harlene Salts, [place] at  shift change who will follow-up on CTA chest and delta troponin. If normal, patient may be discharged home with cardiology referral.    Discussed case with Dr. Charm Barges who evaluated patient at bedside and agrees with assessment and plan.  Final Clinical Impression(s) / ED Diagnoses Final diagnoses:  Atypical chest pain  Dizziness  Nausea    Rx / DC Orders ED Discharge Orders    None       Jesusita Oka 06/03/19 1536    Terrilee Files, MD 06/03/19 1725

## 2019-06-05 ENCOUNTER — Other Ambulatory Visit: Payer: Self-pay

## 2019-06-05 ENCOUNTER — Encounter (HOSPITAL_COMMUNITY): Payer: Self-pay | Admitting: Emergency Medicine

## 2019-06-05 ENCOUNTER — Emergency Department (HOSPITAL_COMMUNITY)
Admission: EM | Admit: 2019-06-05 | Discharge: 2019-06-05 | Disposition: A | Discharge status: Home / Self Care | Payer: Medicaid Other | Attending: Emergency Medicine | Admitting: Emergency Medicine

## 2019-06-05 DIAGNOSIS — R42 Dizziness and giddiness: Secondary | ICD-10-CM | POA: Diagnosis not present

## 2019-06-05 DIAGNOSIS — E039 Hypothyroidism, unspecified: Secondary | ICD-10-CM | POA: Insufficient documentation

## 2019-06-05 DIAGNOSIS — I10 Essential (primary) hypertension: Secondary | ICD-10-CM | POA: Insufficient documentation

## 2019-06-05 DIAGNOSIS — R110 Nausea: Secondary | ICD-10-CM | POA: Diagnosis not present

## 2019-06-05 DIAGNOSIS — Z79899 Other long term (current) drug therapy: Secondary | ICD-10-CM | POA: Diagnosis not present

## 2019-06-05 DIAGNOSIS — R112 Nausea with vomiting, unspecified: Secondary | ICD-10-CM | POA: Diagnosis not present

## 2019-06-05 DIAGNOSIS — Z87891 Personal history of nicotine dependence: Secondary | ICD-10-CM | POA: Insufficient documentation

## 2019-06-05 LAB — CBC WITH DIFFERENTIAL/PLATELET
Abs Immature Granulocytes: 0.01 10*3/uL (ref 0.00–0.07)
Basophils Absolute: 0.1 10*3/uL (ref 0.0–0.1)
Basophils Relative: 1 %
Eosinophils Absolute: 0 10*3/uL (ref 0.0–0.5)
Eosinophils Relative: 1 %
HCT: 47.6 % (ref 39.0–52.0)
Hemoglobin: 15.8 g/dL (ref 13.0–17.0)
Immature Granulocytes: 0 %
Lymphocytes Relative: 27 %
Lymphs Abs: 1.8 10*3/uL (ref 0.7–4.0)
MCH: 28.4 pg (ref 26.0–34.0)
MCHC: 33.2 g/dL (ref 30.0–36.0)
MCV: 85.5 fL (ref 80.0–100.0)
Monocytes Absolute: 0.6 10*3/uL (ref 0.1–1.0)
Monocytes Relative: 9 %
Neutro Abs: 4.2 10*3/uL (ref 1.7–7.7)
Neutrophils Relative %: 62 %
Platelets: 267 10*3/uL (ref 150–400)
RBC: 5.57 MIL/uL (ref 4.22–5.81)
RDW: 11.9 % (ref 11.5–15.5)
WBC: 6.6 10*3/uL (ref 4.0–10.5)
nRBC: 0 % (ref 0.0–0.2)

## 2019-06-05 LAB — COMPREHENSIVE METABOLIC PANEL
ALT: 26 U/L (ref 0–44)
AST: 24 U/L (ref 15–41)
Albumin: 5 g/dL (ref 3.5–5.0)
Alkaline Phosphatase: 74 U/L (ref 38–126)
Anion gap: 13 (ref 5–15)
BUN: 10 mg/dL (ref 6–20)
CO2: 25 mmol/L (ref 22–32)
Calcium: 10.1 mg/dL (ref 8.9–10.3)
Chloride: 101 mmol/L (ref 98–111)
Creatinine, Ser: 0.93 mg/dL (ref 0.61–1.24)
GFR calc Af Amer: >60 mL/min (ref >60)
GFR calc non Af Amer: >60 mL/min (ref >60)
Glucose, Bld: 70 mg/dL (ref 70–99)
Potassium: 3.6 mmol/L (ref 3.5–5.1)
Sodium: 139 mmol/L (ref 135–145)
Total Bilirubin: 0.8 mg/dL (ref 0.3–1.2)
Total Protein: 8.6 g/dL — ABNORMAL HIGH (ref 6.5–8.1)

## 2019-06-05 MED ORDER — ONDANSETRON HCL 4 MG/2ML IJ SOLN
4.0000 mg | Freq: Once | INTRAMUSCULAR | Status: AC
  Administered 2019-06-05: 4 mg via INTRAVENOUS
  Filled 2019-06-05: qty 2

## 2019-06-05 MED ORDER — SODIUM CHLORIDE 0.9 % IV BOLUS
1000.0000 mL | Freq: Once | INTRAVENOUS | Status: AC
  Administered 2019-06-05: 1000 mL via INTRAVENOUS

## 2019-06-05 MED ORDER — ONDANSETRON 4 MG PO TBDP: 4.0000 mg | ORAL_TABLET | Freq: Three times a day (TID) | ORAL | 0 refills | Status: DC | PRN

## 2019-06-05 NOTE — ED Provider Notes (Signed)
[facility]-EMERGENCY DEPT Provider Note   CSN: [id number] Arrival date & time: 06/05/19  1334     History Chief Complaint  Patient presents with  . Nausea  . Dizziness    Larry Thomas is a 44 y.o. male.  HPI Patient is a 44 year old male with history of hypothyroidism, hypertension, hypercholesterolemia presented today with nausea, lightheadedness, clammy palms, fatigue.   Patient states that he was seen  in emergency department 2 days ago and was extensively evaluated at that time.  He states that his last visit he was having chest pain and some shortness of breath however states that the symptoms resolved with the lightheadedness and nausea and weakness remain.  He states that he was told to return to ED if he had any new or concerning symptoms and is returned to ED today because he felt that his nausea and lightheadedness got worse this morning.  He denies any true aggravating or mitigating factors however.  He states that he plans to follow-up with cardiology as was recommended at his last discharge however it being the weekend he was unable to do so.  On my review of EMR Patient's work-up 2 days ago had reassuring CBC and BMP.  Dimer was negative and high-sensitivity troponin was negative.  UA within normal limits and CBG within normal limits.  TSH was also tested with no evidence of hypo or hyperthyroidism.  Chest x-ray was unremarkable.  Chest CT abdomen pelvis dissection study was ordered to evaluate for thoracic aortic dissection and was negative as well.  His vital signs are within normal limits at the entirety of his visit and he was discharged with cardiology follow-up.      Past Medical History:  Diagnosis Date  . Hypercholesterolemia   . Hypertension   . Hypothyroidism   . Thyroid disease     Patient Active Problem List   Diagnosis Date Noted  . Mixed hyperlipidemia 05/18/2017  . Essential hypertension 05/18/2017    Past Surgical  History:  Procedure Laterality Date  . BILATERAL CARPAL TUNNEL RELEASE Bilateral 04/12/2015   Procedure: BILATERAL CARPAL TUNNEL RELEASE;  Surgeon: Betha Loa, MD;  Location: [facility];  Service: Orthopedics;  Laterality: Bilateral;       Family History  Problem Relation Age of Onset  . Thyroid cancer Mother     Social History   Tobacco Use  . Smoking status: Former Games developer  . Smokeless tobacco: Never Used  Substance Use Topics  . Alcohol use: No  . Drug use: No    Home Medications Prior to Admission medications   Medication Sig Start Date End Date Taking? Authorizing Provider  amLODipine (NORVASC) 5 MG tablet TAKE 1 TABLET (5 MG TOTAL) BY MOUTH DAILY. 06/03/19   Hoy Register, MD  aspirin 81 MG chewable tablet Chew 81 mg by mouth daily as needed for mild pain.    [provider]  Blood Pressure Monitor DEVI Please provide patient with insurance approved blood pressure monitor ICD 10 I10.0 02/21/19   Claiborne Rigg, NP  cyclobenzaprine (FLEXERIL) 10 MG tablet Take 1 tablet (10 mg total) by mouth 3 (three) times daily. Patient not taking: Reported on 08/18/2018 05/18/17   Claiborne Rigg, NP  Disposable Gloves (POWDER FREE NITRILE GLOVES XL) MISC 1 each by Does not apply route daily as needed. Please provide patient with insurance approved gloves due to COVID 19. Z91.89 02/21/19   Claiborne Rigg, NP  fluticasone (FLONASE) 50 MCG/ACT nasal spray  Place 1 spray into both nostrils daily. Patient not taking: Reported on 08/18/2018 05/18/17   [name], NP  ibuprofen (ADVIL) 200 MG tablet Take 800 mg by mouth every 6 (six) hours as needed for moderate pain.    [provider]  ibuprofen (ADVIL,MOTRIN) 800 MG tablet Take 1 tablet (800 mg total) by mouth every 8 (eight) hours as needed. Patient not taking: Reported on 06/03/2019 09/28/17   [name], NP  levothyroxine (SYNTHROID) 175 MCG tablet TAKE 1 TABLET (175 MCG TOTAL) BY MOUTH DAILY.  05/02/19 06/03/19  Charlott Rakes, MD  Masks MISC 1 each by Does not apply route daily as needed. Please provide patient with insurance approved masks due to COVID 19. Z91.89 02/21/19   [name], NP  Multiple Vitamin (MULTIVITAMIN WITH MINERALS) TABS tablet Take 1 tablet by mouth daily.    [provider]  ondansetron (ZOFRAN ODT) 4 MG disintegrating tablet Take 1 tablet (4 mg total) by mouth every 8 (eight) hours as needed for nausea or vomiting. 06/05/19   Tedd Sias, PA    Allergies    Patient has no known allergies.  Review of Systems   Review of Systems  Constitutional: Positive for chills and fatigue. Negative for fever.  HENT: Negative for congestion.   Eyes: Negative for pain.  Respiratory: Negative for cough and shortness of breath.   Cardiovascular: Negative for chest pain and leg swelling.  Gastrointestinal: Positive for nausea. Negative for abdominal pain, diarrhea and vomiting.  Genitourinary: Negative for dysuria.  Musculoskeletal: Negative for myalgias.  Skin: Negative for rash.  Neurological: Positive for light-headedness. Negative for dizziness and headaches.    Physical Exam Updated Vital Signs BP (!) 137/93 (BP Location: Left Arm)   Pulse 62   Temp 98 F (36.7 C) (Oral)   Resp 18   SpO2 100%   Physical Exam Vitals and nursing note reviewed.  Constitutional:      General: He is not in acute distress.    Comments: Patient does not appear to be in any discomfort or pain.  Is not ill-appearing is not diaphoretic or toxic appearing.  He is pleasant 44 year old male appears stated age.  Able answer questions appropriately follow command.  HENT:     Head: Normocephalic and atraumatic.     Nose: Nose normal.     Mouth/Throat:     Mouth: Mucous membranes are dry.  Eyes:     General: No scleral icterus.    Extraocular Movements: Extraocular movements intact.  Cardiovascular:     Rate and Rhythm: Normal rate and regular rhythm.     Pulses:  Normal pulses.     Heart sounds: Normal heart sounds.  Pulmonary:     Effort: Pulmonary effort is normal. No respiratory distress.     Breath sounds: No wheezing.  Abdominal:     Palpations: Abdomen is soft.     Tenderness: There is no abdominal tenderness.  Musculoskeletal:     Cervical back: Normal range of motion.     Right lower leg: No edema.     Left lower leg: No edema.     Comments: Strength 5/5 bilateral upper and lower extremities.  Skin:    General: Skin is warm and dry.     Capillary Refill: Capillary refill takes less than 2 seconds.     Comments: Cap refill within normal limits.  Hand was somewhat clammy and cool to touch.  No diaphoresis.  Neurological:     Mental  Status: He is alert. Mental status is at baseline.     Comments: Alert and oriented to self, place, time and event.   Speech is fluent, clear without dysarthria or dysphasia.   Strength 5/5 in upper/lower extremities  Sensation intact in upper/lower extremities   Normal gait --patient ambulated around room 3 times and was able to ambulate without holding onto a cane for support. Negative Romberg. No pronator drift.  Normal finger-to-nose and feet tapping.  CN I not tested  CN II grossly intact visual fields bilaterally. Did not visualize posterior eye.   CN III, IV, VI PERRLA and EOMs intact bilaterally  CN V Intact sensation to sharp and light touch to the face  CN VII facial movements symmetric  CN VIII not tested  CN IX, X no uvula deviation, symmetric rise of soft palate  CN XI 5/5 SCM and trapezius strength bilaterally  CN XII Midline tongue protrusion, symmetric L/R movements   Psychiatric:        Mood and Affect: Mood normal.        Behavior: Behavior normal.     ED Results / Procedures / Treatments   Labs (all labs ordered are listed, but only abnormal results are displayed) Labs Reviewed  COMPREHENSIVE METABOLIC PANEL - Abnormal; Notable for the following components:      Result  Value   Total Protein 8.6 (*)    All other components within normal limits  CBC WITH DIFFERENTIAL/PLATELET    EKG EKG Interpretation  Date/Time:  Sunday June 05 2019 14:21:15 EDT Ventricular Rate:  67 PR Interval:    QRS Duration: 87 QT Interval:  354 QTC Calculation: 374 R Axis:   59 Text Interpretation: Sinus rhythm Probable anteroseptal infarct, old No significant change since last tracing Confirmed by Linwood Dibbles [phone]) on 06/05/2019 4:19:23 PM   Radiology No results found.  Procedures Procedures (including critical care time)  Medications Ordered in ED Medications  ondansetron (ZOFRAN) injection 4 mg (4 mg Intravenous Given 06/05/19 1636)  sodium chloride 0.9 % bolus 1,000 mL (0 mLs Intravenous Stopped 06/05/19 1818)    ED Course  I have reviewed the triage vital signs and the nursing notes.  Pertinent labs & imaging results that were available during my care of the patient were reviewed by me and considered in my medical decision making (see chart for details).  Clinical Course as of Jun 06 1247  Sun Jun 05, 2019  1702 EKG independently read by myself.  Has no acute abnormalities. NSR, normal axis, good R-wave progression. Non-specific TWI.    [WF]    Clinical Course User Index [WF] Gailen Shelter, [place]   MDM Rules/Calculators/A&P                      Patient is 44 year old male presented today with nausea and lightheadedness that is worse when he stands up his exam.  Patient is neurologically intact.  No focal weakness he is able to ambulate without difficulty he has no exertional symptoms he does seem to have some positional worsening of his nausea especially with looking to the left which may represent a BPD.  He has no recent URI or specific reasons to be expensing labyrinthitis however this is considered as well.  Low suspicion for Mnire's as he has no groin tenderness or auditory issues.  Doubt posterior cerebellar stroke.  He has no disequilibrium no  balance issues and his dizziness is more consistent with lightheadedness than true vertiginous symptoms  although he does at times endorse room spinning.  Patient has had no syncopal episodes and has no current chest pain or shortness of breath.  He also has no headache.  Doubt subarachnoid, PE, aortic dissection, AAA, he also denies any neck trauma.  Doubt cerebral or vertebral artery dissection.  CMP and CBC without abnormality.  EKG unchanged from prior visit.  No acute abnormality.  Doubt arrhythmia causing his symptoms but  follow-up with cardiology would be reasonable to evaluate for arrhythmia potentially causing his symptoms.  Patient on cardiac monitor during entirety of ED visit --I personally reviewed the cardiac monitor and on no periods of sustained arrhythmias, some PVCs however they were nonsustained and isolated.  She decision-making conversation with patient.  He has had recent extensive work-up.  Would prefer to be discharged this time states that he will follow up tomorrow morning with his primary care doctor and arrange for follow-up appointment as well as follow-up with ENT and cardiology.    Larry Thomas was evaluated in Emergency Department on 06/06/2019 for the symptoms described in the history of present illness. He was evaluated in the context of the global COVID-19 pandemic, which necessitated consideration that the patient might be at risk for infection with the SARS-CoV-2 virus that causes COVID-19. Institutional protocols and algorithms that pertain to the evaluation of patients at risk for COVID-19 are in a state of rapid change based on information released by regulatory bodies including the CDC and federal and state organizations. These policies and algorithms were followed during the patient's care in the ED.  The medical records were personally reviewed by myself. I personally reviewed all lab results and interpreted all imaging studies and either concurred with  their official read or contacted radiology for clarification.   This patient appears reasonably screened and I doubt any other medical condition requiring further workup, evaluation, or treatment in the ED at this time prior to discharge.   Patient's vitals are WNL apart from vital sign abnormalities discussed above, patient is in NAD, and able to ambulate in the ED at their baseline and able to tolerate PO.  Pain has been managed or a plan has been made for home management and has no complaints prior to discharge. Patient is comfortable with above plan and for discharge at this time. All questions were answered prior to disposition. Results from the ER workup discussed with the patient face to face and all questions answered to the best of my ability. The patient is safe for discharge with strict return precautions. Patient appears safe for discharge with appropriate follow-up. Conveyed my impression with the patient and they voiced understanding and are agreeable to plan.   An After Visit Summary was printed and given to the patient.  Portions of this note were generated with Scientist, clinical (histocompatibility and immunogenetics). Dictation errors may occur despite best attempts at proofreading.   I discussed this case with my attending physician who cosigned this note including patient's presenting symptoms, physical exam, and planned diagnostics and interventions. Attending physician stated agreement with plan or made changes to plan which were implemented.   Attending physician assessed patient at bedside.  Final Clinical Impression(s) / ED Diagnoses Final diagnoses:  Dizziness  Nausea    Rx / DC Orders ED Discharge Orders         Ordered    ondansetron (ZOFRAN ODT) 4 MG disintegrating tablet  Every 8 hours PRN     06/05/19 1808  Solon AugustaFondaw, Aleen [place], [place] 06/06/19 1249    Linwood DibblesKnapp, Jon, MD 06/08/19 [phone]

## 2019-06-05 NOTE — ED Notes (Signed)
Provided pt with crackers and ginger-ale

## 2019-06-05 NOTE — ED Triage Notes (Signed)
Per pt, states he was seen here on Friday for the same symptoms-states he is dizzy and nauseated

## 2019-06-05 NOTE — ED Notes (Signed)
Pt states he attempted to drink the ginger ale and eat the crackers but was unable to do so. Pt was asked if he was having increased nausea but pt denies. He states he is just having trouble eating.

## 2019-06-05 NOTE — Discharge Instructions (Signed)
Please use Zofran for nausea.  Please call your primary care doctor tomorrow morning and call cardiology to make an appointment.  Please return to ED for any new or concerning symptoms such as those that we discussed.  Or any anything new or concerning.  Please drink plenty of water.

## 2019-06-05 NOTE — ED Notes (Signed)
Pt states he is not actively having chest pain, but feel like his heart is racing. Pts pulse currently ranging between 55-69 on monitor. Pt denies any fever, but admits to some nausea no vomiting. Pt is also complaining of oliguria. He states that he is drinking plenty fof fluid but has noticed that whenever he goes to use the restroom there is only a small amount of urine which is not normal for him. Pt denies hematuria or dysuria at this time.

## 2019-06-06 ENCOUNTER — Telehealth: Payer: Self-pay | Admitting: Nurse Practitioner

## 2019-06-06 MED FILL — ONDANSETRON ODT 4 MG TABLET: 4 | 5 days supply | Qty: 20 | Fill #0

## 2019-06-06 NOTE — Telephone Encounter (Signed)
Patient called and requested to speak with pcp regarding his most recent Hospital follow up, patient stated that he needed a referral to a ENT and a Cardiologist. HFU Appt Set for 06/15/2019 @ 10:10am.

## 2019-06-09 ENCOUNTER — Other Ambulatory Visit: Payer: Self-pay | Admitting: Nurse Practitioner

## 2019-06-09 DIAGNOSIS — J339 Nasal polyp, unspecified: Secondary | ICD-10-CM

## 2019-06-09 NOTE — Telephone Encounter (Signed)
Referral was already placed by the ED for Cardiology to DR. Bridgette Christopher. He can call their office to schedule Jodelle Red, MD, PhD Cardiologist [place] Address: [address], [place], [place] [address] Phone: [phone] Check insurance info   I will place another referral to Dr. Suszanne Conners ENT as there was one placed last year as well.

## 2019-06-10 NOTE — Telephone Encounter (Signed)
Spoke to patient and informed on referrals. Pt. Understood.  Pt. Would like to ask PCP if it is okay for him to drink Pedialyte for hydration while taking medication Amlodipine.

## 2019-06-13 NOTE — Telephone Encounter (Signed)
Im not clear why he can't drink water instead of pedialyte and what this would have to do with taking amlodipine as this is not a diuretic medication.

## 2019-06-15 ENCOUNTER — Encounter: Payer: Self-pay | Admitting: Nurse Practitioner

## 2019-06-15 ENCOUNTER — Ambulatory Visit: Payer: Medicaid Other | Attending: Nurse Practitioner | Admitting: Nurse Practitioner

## 2019-06-15 ENCOUNTER — Other Ambulatory Visit: Payer: Self-pay

## 2019-06-15 DIAGNOSIS — I10 Essential (primary) hypertension: Secondary | ICD-10-CM | POA: Insufficient documentation

## 2019-06-15 DIAGNOSIS — R42 Dizziness and giddiness: Secondary | ICD-10-CM | POA: Diagnosis not present

## 2019-06-15 DIAGNOSIS — E7800 Pure hypercholesterolemia, unspecified: Secondary | ICD-10-CM | POA: Insufficient documentation

## 2019-06-15 DIAGNOSIS — G4700 Insomnia, unspecified: Secondary | ICD-10-CM | POA: Insufficient documentation

## 2019-06-15 DIAGNOSIS — Z87891 Personal history of nicotine dependence: Secondary | ICD-10-CM | POA: Insufficient documentation

## 2019-06-15 DIAGNOSIS — Z09 Encounter for follow-up examination after completed treatment for conditions other than malignant neoplasm: Secondary | ICD-10-CM | POA: Diagnosis not present

## 2019-06-15 DIAGNOSIS — E039 Hypothyroidism, unspecified: Secondary | ICD-10-CM | POA: Insufficient documentation

## 2019-06-15 NOTE — Progress Notes (Signed)
Virtual Visit via Telephone Note Due to national recommendations of social distancing due to COVID 19, telehealth visit is felt to be most appropriate for this patient at this time.  I discussed the limitations, risks, security and privacy concerns of performing an evaluation and management service by telephone and the availability of in person appointments. I also discussed with the patient that there may be a patient responsible charge related to this service. The patient expressed understanding and agreed to proceed.    I connected with Larry Thomas on 06/15/19  at  10:10 AM EDT  EDT by telephone and verified that I am speaking with the correct person using two identifiers.   Consent I discussed the limitations, risks, security and privacy concerns of performing an evaluation and management service by telephone and the availability of in person appointments. I also discussed with the patient that there may be a patient responsible charge related to this service. The patient expressed understanding and agreed to proceed.   Location of Patient: Private Residence    Location of Provider: [facility]    Persons participating in Telemedicine visit: [name] FNP-BC [facility] CMA Larry Thomas    History of Present Illness: Telemedicine visit for: HFU  Seen in the ED on 06/05/2019 with complaints of nausea, lightheadedness, fatigue and clammy palms.  He had also been seen in the emergency department 2 days prior and reported being extensively evaluated at that time due to symptoms of chest pain and shortness of breath. Work up was essentially normal.  Today he reports symptoms have currently improved. Still experiencing mild tinnitus and light headedness intermittently. Has been referred to ENT and has upcoming Cardiology appt.   Insomnia Drinking camomille tea. Having difficulty falling asleep. Onset recently. Instructed to start melatonin at this  time.   Past Medical History:  Diagnosis Date  . Hypercholesterolemia   . Hypertension   . Hypothyroidism   . Thyroid disease     Past Surgical History:  Procedure Laterality Date  . BILATERAL CARPAL TUNNEL RELEASE Bilateral 04/12/2015   Procedure: BILATERAL CARPAL TUNNEL RELEASE;  Surgeon: Betha Loa, MD;  Location: [facility];  Service: Orthopedics;  Laterality: Bilateral;    Family History  Problem Relation Age of Onset  . Thyroid cancer Mother     Social History   Socioeconomic History  . Marital status: Married    Spouse name: Not on file  . Number of children: Not on file  . Years of education: Not on file  . Highest education level: Not on file  Occupational History  . Not on file  Tobacco Use  . Smoking status: Former Games developer  . Smokeless tobacco: Never Used  Substance and Sexual Activity  . Alcohol use: No  . Drug use: No  . Sexual activity: Yes  Other Topics Concern  . Not on file  Social History Narrative   Patient is married.    Social Determinants of Health   Financial Resource Strain:   . Difficulty of Paying Living Expenses:   Food Insecurity:   . Worried About Programme researcher, broadcasting/film/video in the Last Year:   . Barista in the Last Year:   Transportation Needs:   . Freight forwarder (Medical):   Marland Kitchen Lack of Transportation (Non-Medical):   Physical Activity:   . Days of Exercise per Week:   . Minutes of Exercise per Session:   Stress:   . Feeling of Stress :  Social Connections:   . Frequency of Communication with Friends and Family:   . Frequency of Social Gatherings with Friends and Family:   . Attends Religious Services:   . Active Member of Clubs or Organizations:   . Attends Archivist Meetings:   Marland Kitchen Marital Status:      Observations/Objective: Awake, alert and oriented x 3   Review of Systems  Constitutional: Negative for fever, malaise/fatigue and weight loss.  HENT: Positive for tinnitus. Negative for  nosebleeds.   Eyes: Negative.  Negative for blurred vision, double vision and photophobia.  Respiratory: Negative.  Negative for cough and shortness of breath.   Cardiovascular: Negative.  Negative for chest pain, palpitations and leg swelling.  Gastrointestinal: Negative.  Negative for heartburn, nausea and vomiting.  Musculoskeletal: Negative.  Negative for myalgias.  Neurological: Negative for dizziness, focal weakness, seizures and headaches.  Psychiatric/Behavioral: Negative for suicidal ideas. The patient is nervous/anxious and has insomnia.     Assessment and Plan: [name] was seen today for hospitalization follow-up.  Diagnoses and all orders for this visit:  Hospital discharge follow-up Follow up with Cardiology and ENT as instructed   Follow Up Instructions Return in about 3 months (around 09/15/2019).     I discussed the assessment and treatment plan with the patient. The patient was provided an opportunity to ask questions and all were answered. The patient agreed with the plan and demonstrated an understanding of the instructions.   The patient was advised to call back or seek an in-person evaluation if the symptoms worsen or if the condition fails to improve as anticipated.  I provided 14 minutes of non-face-to-face time during this encounter including median intraservice time, reviewing previous notes, labs, imaging, medications and explaining diagnosis and management.  [name], FNP-BC

## 2019-06-15 NOTE — Telephone Encounter (Signed)
Pt. Was informed on PCP advising. Pt. Had a televisit with PCP.

## 2019-06-20 ENCOUNTER — Ambulatory Visit (INDEPENDENT_AMBULATORY_CARE_PROVIDER_SITE_OTHER): Payer: Medicaid Other | Admitting: Cardiology

## 2019-06-20 ENCOUNTER — Encounter: Payer: Self-pay | Admitting: Cardiology

## 2019-06-20 ENCOUNTER — Other Ambulatory Visit: Payer: Self-pay

## 2019-06-20 VITALS — Ht 69.0 in | Wt 220.6 lb

## 2019-06-20 DIAGNOSIS — Z6832 Body mass index (BMI) 32.0-32.9, adult: Secondary | ICD-10-CM

## 2019-06-20 DIAGNOSIS — Z7189 Other specified counseling: Secondary | ICD-10-CM

## 2019-06-20 DIAGNOSIS — I10 Essential (primary) hypertension: Secondary | ICD-10-CM

## 2019-06-20 DIAGNOSIS — I700 Atherosclerosis of aorta: Secondary | ICD-10-CM

## 2019-06-20 DIAGNOSIS — E6609 Other obesity due to excess calories: Secondary | ICD-10-CM

## 2019-06-20 DIAGNOSIS — R42 Dizziness and giddiness: Secondary | ICD-10-CM

## 2019-06-20 NOTE — Progress Notes (Signed)
Cardiology Office Note:    Date:  06/20/2019   ID:  Larry Thomas, DOB 1975-06-03, MRN [id number]  PCP:  [name], NP  Cardiologist:  Buford Dresser, MD  Referring MD: [name], NP   CC: new patient evaluation for history of lightheadedness, prior chest pain/shortness of breath  History of Present Illness:    Larry Thomas is a 44 y.o. male with a hx of hypertension, hypercholesterolemia who is seen as a new consult at the request of [name], NP for the evaluation and management of lightheadedness, prior chest pain/shortness of breath.  Note from Geryl Rankins reviewed from 06/15/19. Also reviewed ER notes from 06/05/19 and workup. D dimer normal, hsTn <2, <2.   He notes that intermittently he gets upper left chest pain, "from time to time." Comes and goes, feels like electricity, sometimes feels like pressure. Nothing severe. Lasts only briefly. No clear triggers, no alleviating factors.  Has occasional ringing in his ears, nausea. Was being evaluated for vertigo. Only rare lightheadedness now.   Cardiovascular risk factors: Prior clinical ASCVD: none Comorbid conditions: Yes--hypertension (at least 5 years), hyperlipidemia. No--diabetes, chronic kidney disease (only history of kidney stones) Metabolic syndrome/Obesity: highest adult weight 280 lbs, lost weight intentionally, became more active Chronic inflammatory conditions: none Tobacco use history: stopped smoking about a year ago. Since he was a teenager, peak 2 ppd. Alcohol use: stopped drinking 5 years ago.  Family history: no known CVA or MI that he knows of Prior cardiac testing and/or incidental findings on other testing (ie coronary calcium): aortic atherosclerosis/calcification on CT Exercise level: lift weights (mild weights), exercise bike, treadmill.  Current diet: made big changes, cut out red meat, eating more fruits, vegetables, nuts, lean white meat  Hypertension: has BP  cuff at home, notices when he is stress it can be elevated (~267 systolic), but usually ~124P/80D at home.  High cholesterol: reviewed lipids with him. Ldl 106, HDL 43, TG 65. Discussed prevention strategy, statins today. Will discuss at his 3 mos follow up with Geryl Rankins.  Denies shortness of breath at rest or with normal exertion. No PND, orthopnea, LE edema or unexpected weight gain. No syncope or palpitations.  Past Medical History:  Diagnosis Date  . Hypercholesterolemia   . Hypertension   . Hypothyroidism   . Mixed hyperlipidemia 05/18/2017  . Thyroid disease     Past Surgical History:  Procedure Laterality Date  . BILATERAL CARPAL TUNNEL RELEASE Bilateral 04/12/2015   Procedure: BILATERAL CARPAL TUNNEL RELEASE;  Surgeon: Leanora Cover, MD;  Location: [facility];  Service: Orthopedics;  Laterality: Bilateral;    Current Medications: Current Outpatient Medications on File Prior to Visit  Medication Sig  . amLODipine (NORVASC) 5 MG tablet TAKE 1 TABLET (5 MG TOTAL) BY MOUTH DAILY.  Marland Kitchen aspirin 81 MG chewable tablet Chew 81 mg by mouth daily as needed for mild pain.  . Blood Pressure Monitor DEVI Please provide patient with insurance approved blood pressure monitor ICD 10 I10.0  . Disposable Gloves (POWDER FREE NITRILE GLOVES XL) MISC 1 each by Does not apply route daily as needed. Please provide patient with insurance approved gloves due to COVID 19. Z91.89  . ibuprofen (ADVIL) 200 MG tablet Take 800 mg by mouth every 6 (six) hours as needed for moderate pain.  Marland Kitchen levothyroxine (SYNTHROID) 175 MCG tablet TAKE 1 TABLET (175 MCG TOTAL) BY MOUTH DAILY.  . Masks MISC 1 each by Does not apply route daily as  needed. Please provide patient with insurance approved masks due to COVID 19. Z91.89  . Multiple Vitamin (MULTIVITAMIN WITH MINERALS) TABS tablet Take 1 tablet by mouth daily.   No current facility-administered medications on file prior to visit.     Allergies:    Patient has no known allergies.   Social History   Tobacco Use  . Smoking status: Former Games developer  . Smokeless tobacco: Never Used  Substance Use Topics  . Alcohol use: No  . Drug use: No    Family History: family history includes Thyroid cancer in his mother.  ROS:   Please see the history of present illness.  Additional pertinent ROS: Constitutional: Negative for chills, fever, night sweats, unintentional weight loss  HENT: Negative for ear pain and hearing loss.   Eyes: Negative for loss of vision and eye pain.  Respiratory: Negative for cough, sputum, wheezing.   Cardiovascular: See HPI. Gastrointestinal: Negative for abdominal pain, melena, and hematochezia.  Genitourinary: Negative for dysuria and hematuria.  Musculoskeletal: Negative for falls and myalgias.  Skin: Negative for itching and rash.  Neurological: Negative for focal weakness, focal sensory changes and loss of consciousness.  Endo/Heme/Allergies: Does not bruise/bleed easily.     EKGs/Labs/Other Studies Reviewed:    The following studies were reviewed today: CT angio 06/03/19 Cardiovascular: Preferential opacification of the thoracic aorta. No evidence of thoracic aortic aneurysm or dissection. Normal heart size. No pericardial effusion. No pulmonary emboli are identified. Aorta: Mild aortic atherosclerotic calcifications noted. Normal caliber aorta without aneurysm, dissection, vasculitis or significant stenosis.  EKG:  EKG is personally reviewed.  The ekg ordered today demonstrates NSR  Recent Labs: 06/03/2019: TSH 2.171 06/05/2019: ALT 26; BUN 10; Creatinine, Ser 0.93; Hemoglobin 15.8; Platelets 267; Potassium 3.6; Sodium 139  Recent Lipid Panel    Component Value Date/Time   CHOL 162 05/02/2019 0915   TRIG 65 05/02/2019 0915   HDL 43 05/02/2019 0915   CHOLHDL 3.8 05/02/2019 0915   LDLCALC 106 (H) 05/02/2019 0915    Physical Exam:    VS:  Ht 5\' 9"  (1.753 m)   Wt 220 lb 9.6 oz (100.1 kg)    SpO2 100%   BMI 32.58 kg/m    Orthostatic VS for the past 24 hrs (Last 3 readings):  BP- Lying Pulse- Lying BP- Sitting Pulse- Sitting BP- Standing at 0 minutes Pulse- Standing at 0 minutes BP- Standing at 3 minutes Pulse- Standing at 3 minutes  06/20/19 1001 135/81 89 139/85 89 143/89 92 (!) 140/91 93    Wt Readings from Last 3 Encounters:  06/20/19 220 lb 9.6 oz (100.1 kg)  06/03/19 240 lb (108.9 kg)  11/19/18 233 lb (105.7 kg)    GEN: Well nourished, well developed in no acute distress HEENT: Normal, moist mucous membranes NECK: No JVD CARDIAC: regular rhythm, normal S1 and S2, no rubs or gallops. No murmurs. VASCULAR: Radial and DP pulses 2+ bilaterally. No carotid bruits RESPIRATORY:  Clear to auscultation without rales, wheezing or rhonchi  ABDOMEN: Soft, non-tender, non-distended MUSCULOSKELETAL:  Ambulates independently SKIN: Warm and dry, no edema NEUROLOGIC:  Alert and oriented x 3. No focal neuro deficits noted. PSYCHIATRIC:  Normal affect    ASSESSMENT:    1. Dizziness   2. Aortic atherosclerosis (HCC)   3. Essential hypertension   4. Cardiac risk counseling   5. Counseling on health promotion and disease prevention   6. Class 1 obesity due to excess calories without serious comorbidity with body mass index (BMI) of 32.0 to  32.9 in adult    PLAN:    Atypical chest pain, low cardiovascular risk We spent significant time today reviewing different parts of the cardiovascular system. We discussed how each of these systems can present with different symptoms. We reviewed that there are different ways we evaluate these symptoms with tests. We reviewed that there are many noncardiac causes as well that can contribute to symptoms.  -we reviewed recommended guidelines for testing; given he is low risk with atypical symptoms, not recommended for further testing at this time -did review red flag warning signs that need immediate medical attention  Dizziness/lightheadedness:  improving, orthostatics negative, ECG normal. Low suspicion for cardiac cause  Aortic calcification/atherosclerosis: he is low CV risk, but this incidental finding suggests there may be early atherosclerosis.  -he has quit smoking, which is the best long term prevention -he is taking an aspirin. Discussed that statin recommended over aspirin for prevention. He would like to continue to work on diet, exercise, and weight loss prior to considering a statin. He will follow up with [name] in three months. If he is interested in statin at that time, would recommend pravastatin 20 mg daily to start  Class 1 obesity, BMI 32: has already lost significant weight, congratulated. He plans to continue to work on diet and exercise, as above  Cardiac risk counseling and prevention recommendations: -recommend heart healthy/Mediterranean diet, with whole grains, fruits, vegetable, fish, lean meats, nuts, and olive oil. Limit salt. -recommend moderate walking, 3-5 times/week for 30-50 minutes each session. Aim for at least 150 minutes.week. Goal should be pace of 3 miles/hours, or walking 1.5 miles in 30 minutes -recommend avoidance of tobacco products. Avoid excess alcohol. -ASCVD risk score: The 10-year ASCVD risk score Denman George DC Montez Hageman., et al., 2013) is: 7.2%   Values used to calculate the score:     Age: 85 years     Sex: Male     Is Non-Hispanic African American: Yes     Diabetic: No     Tobacco smoker: No     Systolic Blood Pressure: 137 mmHg     Is BP treated: Yes     HDL Cholesterol: 43 mg/dL     Total Cholesterol: 162 mg/dL    Plan for follow up: as needed  Jodelle Red, MD, PhD [place]  [facility]    Medication Adjustments/Labs and Tests Ordered: Current medicines are reviewed at length with the patient today.  Concerns regarding medicines are outlined above.  Orders Placed This Encounter  Procedures  . EKG 12-Lead   No orders of the defined types were placed in this  encounter.   Patient Instructions  Medication Instructions:  Your Physician recommend you continue on your current medication as directed.    *If you need a refill on your cardiac medications before your next appointment, please call your pharmacy*   Lab Work: None  Testing/Procedures: None   Follow-Up: At [facility], you and your health needs are our priority.  As part of our continuing mission to provide you with exceptional heart care, we have created designated Provider Care Teams.  These Care Teams include your primary Cardiologist (physician) and Advanced Practice Providers (APPs -  Physician Assistants and Nurse Practitioners) who all work together to provide you with the care you need, when you need it.  We recommend signing up for the patient portal called "MyChart".  Sign up information is provided on this After Visit Summary.  MyChart is used to connect with patients  for Virtual Visits (Telemedicine).  Patients are able to view lab/test results, encounter notes, upcoming appointments, etc.  Non-urgent messages can be sent to your provider as well.   To learn more about what you can do with MyChart, go to [email or web address].    Your next appointment:   As Needed  The format for your next appointment:   Either In Person or Virtual  Provider:   Jodelle Red, MD     Signed, Jodelle Red, MD PhD 06/20/2019 1:57 PM    [facility]

## 2019-06-20 NOTE — Patient Instructions (Signed)
Medication Instructions:  Your Physician recommend you continue on your current medication as directed.    *If you need a refill on your cardiac medications before your next appointment, please call your pharmacy*   Lab Work: None  Testing/Procedures: None   Follow-Up: At [facility], you and your health needs are our priority.  As part of our continuing mission to provide you with exceptional heart care, we have created designated Provider Care Teams.  These Care Teams include your primary Cardiologist (physician) and Advanced Practice Providers (APPs -  Physician Assistants and Nurse Practitioners) who all work together to provide you with the care you need, when you need it.  We recommend signing up for the patient portal called "MyChart".  Sign up information is provided on this After Visit Summary.  MyChart is used to connect with patients for Virtual Visits (Telemedicine).  Patients are able to view lab/test results, encounter notes, upcoming appointments, etc.  Non-urgent messages can be sent to your provider as well.   To learn more about what you can do with MyChart, go to [email or web address].    Your next appointment:   As Needed  The format for your next appointment:   Either In Person or Virtual  Provider:   Jodelle Red, MD

## 2019-07-01 ENCOUNTER — Other Ambulatory Visit: Payer: Self-pay | Admitting: Family Medicine

## 2019-07-01 DIAGNOSIS — E039 Hypothyroidism, unspecified: Secondary | ICD-10-CM

## 2019-07-01 MED FILL — AMLODIPINE BESYLATE 5 MG TA: 5 | 30 days supply | Qty: 30 | Fill #1

## 2019-07-01 MED FILL — LEVOTHYROXINE SODIUM 175 MC: 175 | 30 days supply | Qty: 30 | Fill #0

## 2019-07-18 DIAGNOSIS — J342 Deviated nasal septum: Secondary | ICD-10-CM | POA: Diagnosis not present

## 2019-07-18 DIAGNOSIS — H9313 Tinnitus, bilateral: Secondary | ICD-10-CM | POA: Diagnosis not present

## 2019-07-18 DIAGNOSIS — J343 Hypertrophy of nasal turbinates: Secondary | ICD-10-CM | POA: Diagnosis not present

## 2019-07-18 DIAGNOSIS — R42 Dizziness and giddiness: Secondary | ICD-10-CM | POA: Diagnosis not present

## 2019-07-18 DIAGNOSIS — J310 Chronic rhinitis: Secondary | ICD-10-CM | POA: Diagnosis not present

## 2019-07-21 ENCOUNTER — Telehealth: Payer: Self-pay

## 2019-07-21 NOTE — Telephone Encounter (Signed)
I do not have any medical information in his chart regarding kidney stones. Or any imaging verifying kidney stones. Last urine was normal and did not show blood.  He needs a televisit to discuss his symptoms. If his pain is 10/10 he needs to be seen in the emergency room

## 2019-07-21 NOTE — Telephone Encounter (Signed)
Pt contacted the office and is requesting something to help his kidney stones. PT states he has been having kidney stones since early March. PT would like rx sent to [facility]

## 2019-07-22 NOTE — Telephone Encounter (Signed)
Attempt to reach patient to inform on PCP advising. No answer and LVM for call back.  

## 2019-08-02 ENCOUNTER — Other Ambulatory Visit: Payer: Self-pay | Admitting: Family Medicine

## 2019-08-02 DIAGNOSIS — I10 Essential (primary) hypertension: Secondary | ICD-10-CM

## 2019-08-12 DIAGNOSIS — R42 Dizziness and giddiness: Secondary | ICD-10-CM | POA: Diagnosis not present

## 2019-08-18 DIAGNOSIS — R42 Dizziness and giddiness: Secondary | ICD-10-CM | POA: Diagnosis not present

## 2019-08-18 DIAGNOSIS — J310 Chronic rhinitis: Secondary | ICD-10-CM | POA: Diagnosis not present

## 2019-08-18 DIAGNOSIS — J342 Deviated nasal septum: Secondary | ICD-10-CM | POA: Diagnosis not present

## 2019-08-18 DIAGNOSIS — H832X2 Labyrinthine dysfunction, left ear: Secondary | ICD-10-CM | POA: Diagnosis not present

## 2019-09-05 ENCOUNTER — Other Ambulatory Visit: Payer: Self-pay | Admitting: Family Medicine

## 2019-09-05 DIAGNOSIS — E039 Hypothyroidism, unspecified: Secondary | ICD-10-CM

## 2019-09-05 MED FILL — LEVOTHYROXINE SODIUM 175 MC: 175 | 60 days supply | Qty: 60 | Fill #0

## 2019-09-05 MED FILL — AMLODIPINE BESYLATE 5 MG TA: 5 | 30 days supply | Qty: 30 | Fill #1

## 2019-09-16 ENCOUNTER — Other Ambulatory Visit: Payer: Self-pay

## 2019-09-16 ENCOUNTER — Encounter: Payer: Self-pay | Admitting: Nurse Practitioner

## 2019-09-16 ENCOUNTER — Ambulatory Visit: Payer: Medicaid Other | Attending: Nurse Practitioner | Admitting: Nurse Practitioner

## 2019-09-16 ENCOUNTER — Other Ambulatory Visit (HOSPITAL_COMMUNITY)
Admission: RE | Admit: 2019-09-16 | Discharge: 2019-09-16 | Disposition: A | Discharge status: Home / Self Care | Payer: Medicaid Other | Source: Ambulatory Visit | Attending: Nurse Practitioner | Admitting: Nurse Practitioner

## 2019-09-16 VITALS — BP 120/77 | HR 62 | Temp 97.7°F | Wt 223.0 lb

## 2019-09-16 DIAGNOSIS — R5383 Other fatigue: Secondary | ICD-10-CM | POA: Diagnosis not present

## 2019-09-16 DIAGNOSIS — Z79899 Other long term (current) drug therapy: Secondary | ICD-10-CM | POA: Diagnosis not present

## 2019-09-16 DIAGNOSIS — E039 Hypothyroidism, unspecified: Secondary | ICD-10-CM | POA: Diagnosis not present

## 2019-09-16 DIAGNOSIS — Z791 Long term (current) use of non-steroidal anti-inflammatories (NSAID): Secondary | ICD-10-CM | POA: Diagnosis not present

## 2019-09-16 DIAGNOSIS — E782 Mixed hyperlipidemia: Secondary | ICD-10-CM | POA: Insufficient documentation

## 2019-09-16 DIAGNOSIS — Z7982 Long term (current) use of aspirin: Secondary | ICD-10-CM | POA: Diagnosis not present

## 2019-09-16 DIAGNOSIS — I10 Essential (primary) hypertension: Secondary | ICD-10-CM | POA: Insufficient documentation

## 2019-09-16 DIAGNOSIS — R82998 Other abnormal findings in urine: Secondary | ICD-10-CM

## 2019-09-16 DIAGNOSIS — Z79890 Hormone replacement therapy (postmenopausal): Secondary | ICD-10-CM | POA: Insufficient documentation

## 2019-09-16 LAB — POCT URINALYSIS DIP (CLINITEK)
Bilirubin, UA: NEGATIVE
Blood, UA: NEGATIVE
Glucose, UA: NEGATIVE mg/dL
Ketones, POC UA: NEGATIVE mg/dL
Leukocytes, UA: NEGATIVE
Nitrite, UA: NEGATIVE
POC PROTEIN,UA: NEGATIVE
Spec Grav, UA: >=1.03 — AB (ref 1.010–1.025)
Urobilinogen, UA: 0.2 E.U./dL
pH, UA: 6 (ref 5.0–8.0)

## 2019-09-16 NOTE — Progress Notes (Signed)
Assessment & Plan:  Reznor was seen today for follow-up.  Diagnoses and all orders for this visit:  Essential hypertension -     CMP14+EGFR Continue all antihypertensives as prescribed.  Remember to bring in your blood pressure log with you for your follow up appointment.  DASH/Mediterranean Diets are healthier choices for HTN.    Fatigue, unspecified type -     Testosterone  Acquired hypothyroidism -     TSH  Dark urine -     Urine cytology ancillary only -     POCT URINALYSIS DIP (CLINITEK)    Patient has been counseled on age-appropriate routine health concerns for screening and prevention. These are reviewed and up-to-date. Referrals have been placed accordingly. Immunizations are up-to-date or declined.    Subjective:   Chief Complaint  Patient presents with  . Follow-up    Pt. is here for 3 months F.U. Pt. stated he's been having lack of energy.    HPI Larry Thomas 44 y.o. male presents to office today for follow up to HTN.   has a past medical history of Hypercholesterolemia, Hypertension, Hypothyroidism, Mixed hyperlipidemia (05/18/2017), and Thyroid disease.   Essential Hypertension Blood pressure is well controlled today. He endorses medication adherence taking amlodipine 5 mg daily. Denies chest pain, shortness of breath, palpitations, lightheadedness, dizziness, headaches or BLE edema.  BP Readings from Last 3 Encounters:  09/16/19 120/77  06/05/19 (!) 137/93  06/03/19 122/84   States urine has a strong odor. Denies any other GU symptoms. States he is trying to increase his water intake. Denies any bilateral flank pain.   Endorses intermittent fatigue. Takes a daily MVI. He does exercise. .  Riding exercise bike every other day. Using weights. Trys to work out "10 min here and there" in intervals.  Sleeps 6-8 hours a night. Does not drink caffeine in the evening     Review of Systems  Constitutional: Positive for malaise/fatigue. Negative for  fever and weight loss.  HENT: Negative.  Negative for nosebleeds.   Eyes: Negative.  Negative for blurred vision, double vision and photophobia.  Respiratory: Negative.  Negative for cough and shortness of breath.   Cardiovascular: Negative.  Negative for chest pain, palpitations and leg swelling.  Gastrointestinal: Negative.  Negative for heartburn, nausea and vomiting.  Genitourinary: Negative for dysuria, flank pain, frequency, hematuria and urgency.       SEE HPI  Musculoskeletal: Negative.  Negative for myalgias.  Neurological: Negative.  Negative for dizziness, focal weakness, seizures and headaches.  Psychiatric/Behavioral: Negative.  Negative for suicidal ideas.    Past Medical History:  Diagnosis Date  . Hypercholesterolemia   . Hypertension   . Hypothyroidism   . Mixed hyperlipidemia 05/18/2017  . Thyroid disease     Past Surgical History:  Procedure Laterality Date  . BILATERAL CARPAL TUNNEL RELEASE Bilateral 04/12/2015   Procedure: BILATERAL CARPAL TUNNEL RELEASE;  Surgeon: Leanora Cover, MD;  Location: [facility];  Service: Orthopedics;  Laterality: Bilateral;    Family History  Problem Relation Age of Onset  . Thyroid cancer Mother     Social History Reviewed with no changes to be made today.   Outpatient Medications Prior to Visit  Medication Sig Dispense Refill  . amLODipine (NORVASC) 5 MG tablet TAKE 1 TABLET (5 MG TOTAL) BY MOUTH DAILY. 30 tablet 2  . aspirin 81 MG chewable tablet Chew 81 mg by mouth daily as needed for mild pain.    . Blood Pressure Monitor DEVI  Please provide patient with insurance approved blood pressure monitor ICD 10 I10.0 1 each 0  . Disposable Gloves (POWDER FREE NITRILE GLOVES XL) MISC 1 each by Does not apply route daily as needed. Please provide patient with insurance approved gloves due to COVID 19. Z91.89 100 each prn  . ibuprofen (ADVIL) 200 MG tablet Take 800 mg by mouth every 6 (six) hours as needed for moderate  pain.    Marland Kitchen levothyroxine (SYNTHROID) 175 MCG tablet TAKE 1 TABLET (175 MCG TOTAL) BY MOUTH DAILY. 30 tablet 1  . Masks MISC 1 each by Does not apply route daily as needed. Please provide patient with insurance approved masks due to COVID 19. Z91.89 100 each prn  . Multiple Vitamin (MULTIVITAMIN WITH MINERALS) TABS tablet Take 1 tablet by mouth daily.     No facility-administered medications prior to visit.    No Known Allergies     Objective:    BP 120/77 (BP Location: Left Arm, Patient Position: Sitting, Cuff Size: Normal)   Pulse 62   Temp 97.7 F (36.5 C) (Temporal)   Wt 223 lb (101.2 kg)   SpO2 100%   BMI 32.93 kg/m  Wt Readings from Last 3 Encounters:  09/16/19 223 lb (101.2 kg)  06/20/19 220 lb 9.6 oz (100.1 kg)  06/03/19 240 lb (108.9 kg)    Physical Exam Vitals and nursing note reviewed.  Constitutional:      Appearance: He is well-developed.  HENT:     Head: Normocephalic and atraumatic.  Cardiovascular:     Rate and Rhythm: Normal rate and regular rhythm.     Heart sounds: Normal heart sounds. No murmur heard.  No friction rub. No gallop.   Pulmonary:     Effort: Pulmonary effort is normal. No tachypnea or respiratory distress.     Breath sounds: Normal breath sounds. No decreased breath sounds, wheezing, rhonchi or rales.  Chest:     Chest wall: No tenderness.  Abdominal:     General: Bowel sounds are normal.     Palpations: Abdomen is soft.  Musculoskeletal:        General: Normal range of motion.     Cervical back: Normal range of motion.  Skin:    General: Skin is warm and dry.  Neurological:     Mental Status: He is alert and oriented to person, place, and time.     Coordination: Coordination normal.  Psychiatric:        Behavior: Behavior normal. Behavior is cooperative.        Thought Content: Thought content normal.        Judgment: Judgment normal.          Patient has been counseled extensively about nutrition and exercise as well as  the importance of adherence with medications and regular follow-up. The patient was given clear instructions to go to ER or return to medical center if symptoms don't improve, worsen or new problems develop. The patient verbalized understanding.   Follow-up: Return in about 3 months (around 12/17/2019).   [name], FNP-BC [facility] [place], [place]   09/16/2019, 8:07 PM

## 2019-09-17 LAB — CMP14+EGFR
ALT: 25 IU/L (ref 0–44)
AST: 30 IU/L (ref 0–40)
Albumin/Globulin Ratio: 1.7 (ref 1.2–2.2)
Albumin: 4.5 g/dL (ref 4.0–5.0)
Alkaline Phosphatase: 80 IU/L (ref 48–121)
BUN/Creatinine Ratio: 15 (ref 9–20)
BUN: 14 mg/dL (ref 6–24)
Bilirubin Total: 0.3 mg/dL (ref 0.0–1.2)
CO2: 21 mmol/L (ref 20–29)
Calcium: 9.6 mg/dL (ref 8.7–10.2)
Chloride: 102 mmol/L (ref 96–106)
Creatinine, Ser: 0.92 mg/dL (ref 0.76–1.27)
GFR calc Af Amer: 117 mL/min/{1.73_m2} (ref >59)
GFR calc non Af Amer: 101 mL/min/{1.73_m2} (ref >59)
Globulin, Total: 2.6 g/dL (ref 1.5–4.5)
Glucose: 85 mg/dL (ref 65–99)
Potassium: 4.2 mmol/L (ref 3.5–5.2)
Sodium: 138 mmol/L (ref 134–144)
Total Protein: 7.1 g/dL (ref 6.0–8.5)

## 2019-09-17 LAB — TESTOSTERONE: Testosterone: 308 ng/dL (ref 264–916)

## 2019-09-17 LAB — TSH: TSH: 6.74 u[IU]/mL — ABNORMAL HIGH (ref 0.450–4.500)

## 2019-09-19 LAB — URINE CYTOLOGY ANCILLARY ONLY
Candida Urine: NEGATIVE
Chlamydia: NEGATIVE
Comment: NEGATIVE
Comment: NEGATIVE
Comment: NORMAL
Neisseria Gonorrhea: NEGATIVE
Trichomonas: NEGATIVE

## 2019-09-22 ENCOUNTER — Other Ambulatory Visit: Payer: Self-pay | Admitting: Nurse Practitioner

## 2019-09-22 DIAGNOSIS — E039 Hypothyroidism, unspecified: Secondary | ICD-10-CM

## 2019-09-22 MED ORDER — LEVOTHYROXINE SODIUM 200 MCG PO TABS: 200.0000 ug | ORAL_TABLET | Freq: Every day | ORAL | 1 refills | Status: DC

## 2019-09-23 MED FILL — LEVOTHYROXINE SODIUM 200 MC: 200 | 30 days supply | Qty: 30 | Fill #0

## 2019-10-06 MED FILL — AMLODIPINE BESYLATE 5 MG TA: 5 | 30 days supply | Qty: 30 | Fill #2

## 2019-10-31 ENCOUNTER — Other Ambulatory Visit: Payer: Self-pay | Admitting: Nurse Practitioner

## 2019-10-31 ENCOUNTER — Other Ambulatory Visit: Payer: Self-pay | Admitting: Family Medicine

## 2019-10-31 DIAGNOSIS — I10 Essential (primary) hypertension: Secondary | ICD-10-CM

## 2019-10-31 MED FILL — LEVOTHYROXINE SODIUM 200 MC: 200 | 30 days supply | Qty: 30 | Fill #1

## 2019-11-09 IMAGING — CT CT ABD-PELV W/ CM
2 of 5 series · 16 of 46 positions shown, 18 images · IV contrast (agent unspecified)
Comparison: CT 07/16/2011

CLINICAL DATA: Left upper quadrant pain.

EXAM:
CT ABDOMEN AND PELVIS WITH CONTRAST
TECHNIQUE: Multidetector CT imaging of the abdomen and pelvis was performed
using the standard protocol following bolus administration of
intravenous contrast.
CONTRAST:  100 cc [id number] IV

[Series 2: axial st · axial · 0.72mm/px · z∈[+1118,+1488]mm · 13 of 86 slices shown, 15 images]
[im 6/86  soft-tissue]
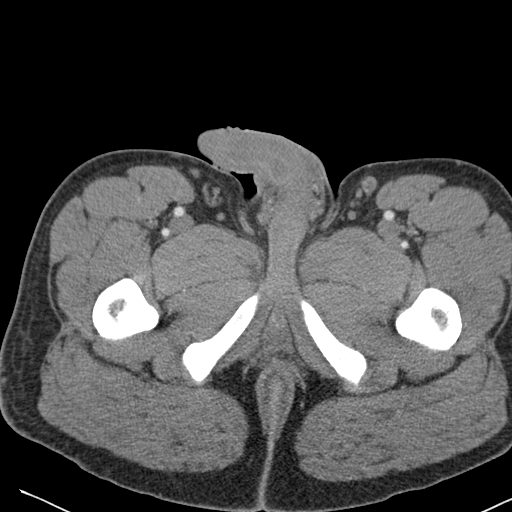
[im 6/86  bone]
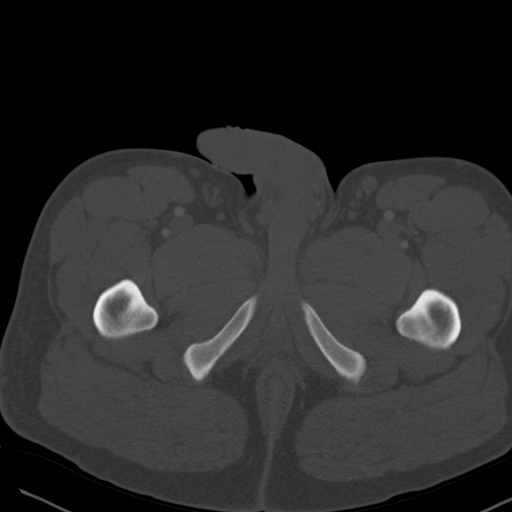
[im 12/86  soft-tissue]
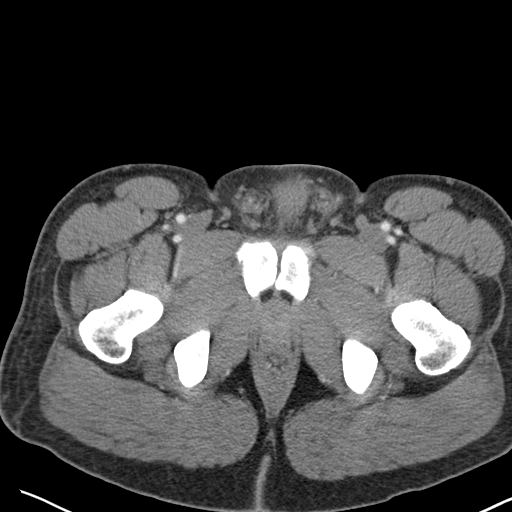
[im 18/86  soft-tissue]
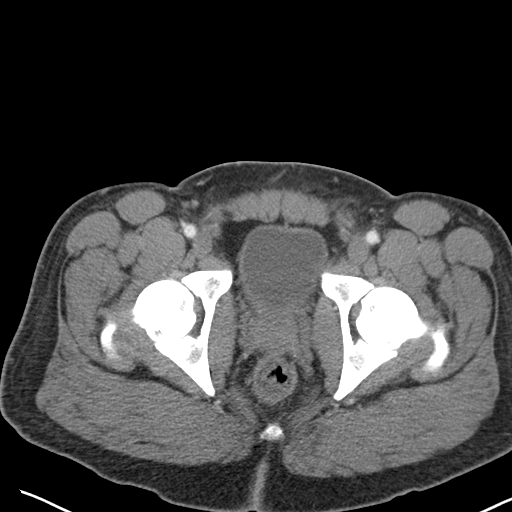
[im 23/86  soft-tissue]
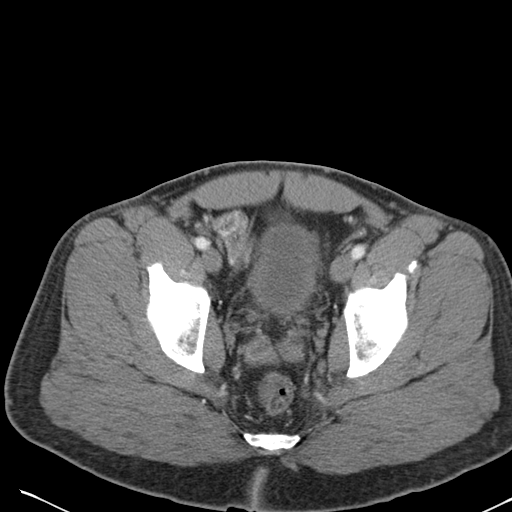
[im 29/86  soft-tissue]
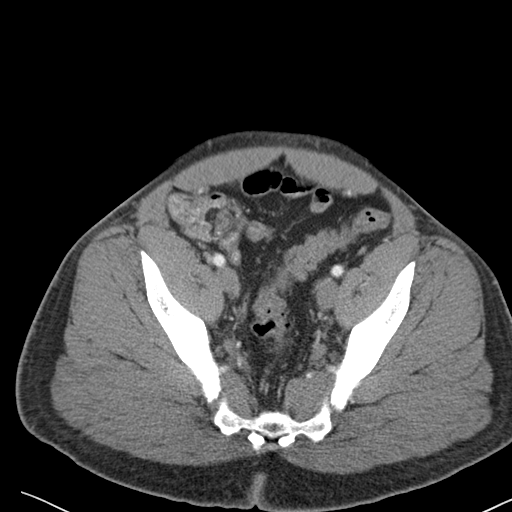
[im 35/86  soft-tissue]
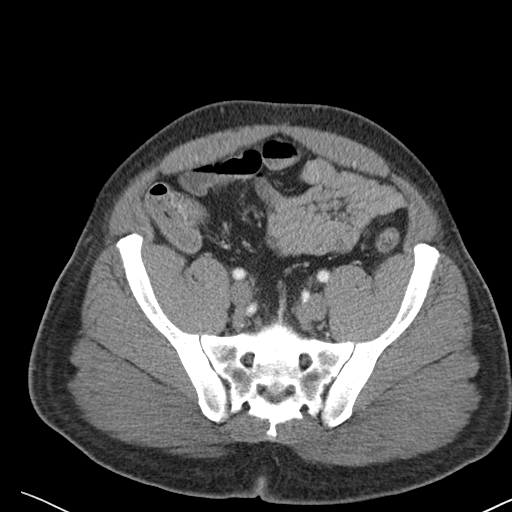
[im 46/86  soft-tissue]
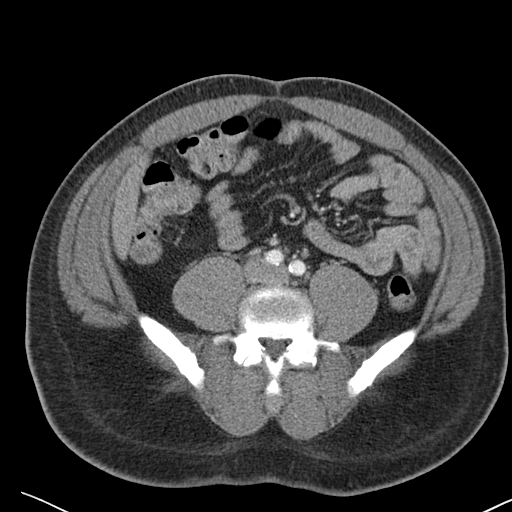
[im 52/86  soft-tissue]
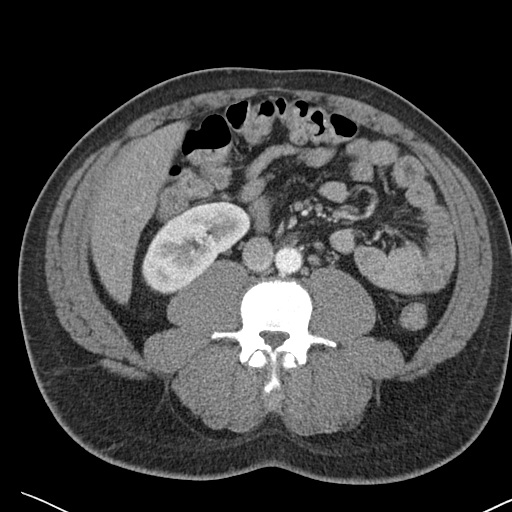
[im 57/86  soft-tissue]
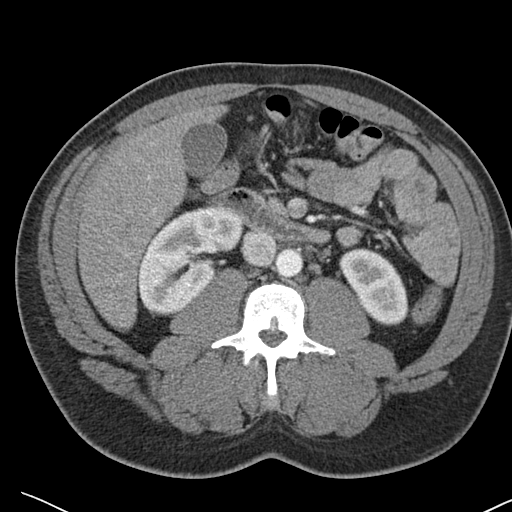
[im 57/86  bone]
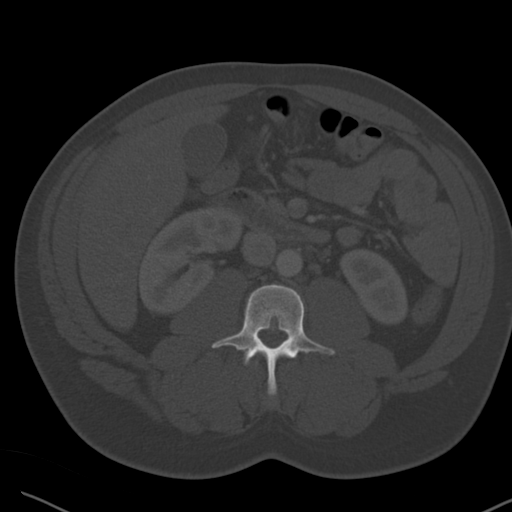
[im 63/86  soft-tissue]
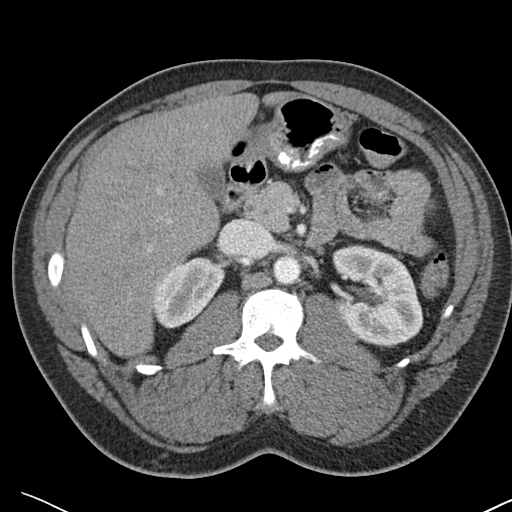
[im 69/86  soft-tissue]
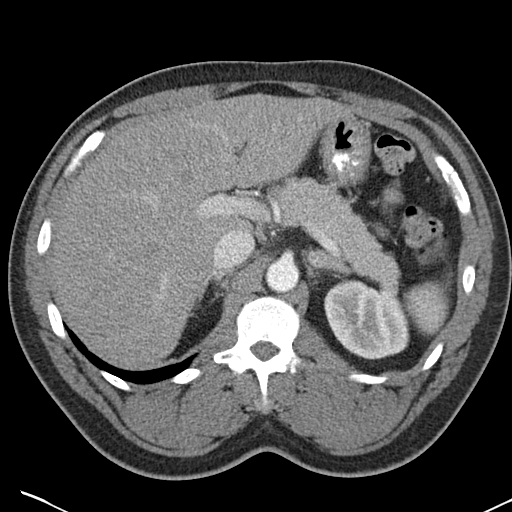
[im 74/86  soft-tissue]
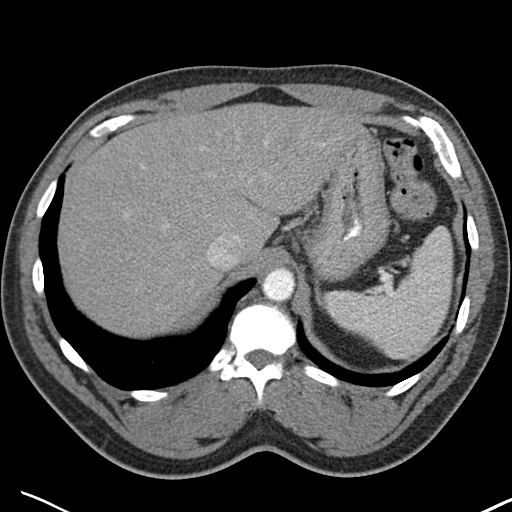
[im 80/86  soft-tissue]
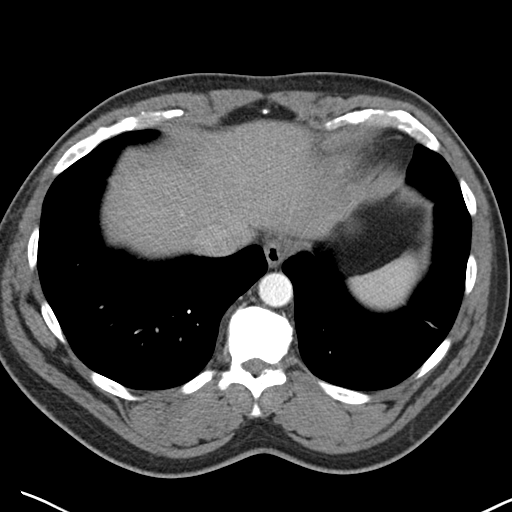

[Series 4: coronal st · coronal · 0.70mm/px · 3 of 100 slices shown]
[im 34/100  soft-tissue]
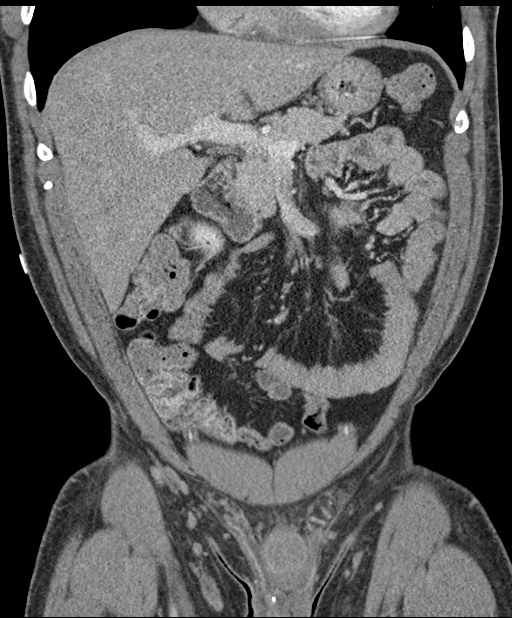
[im 45/100  soft-tissue]
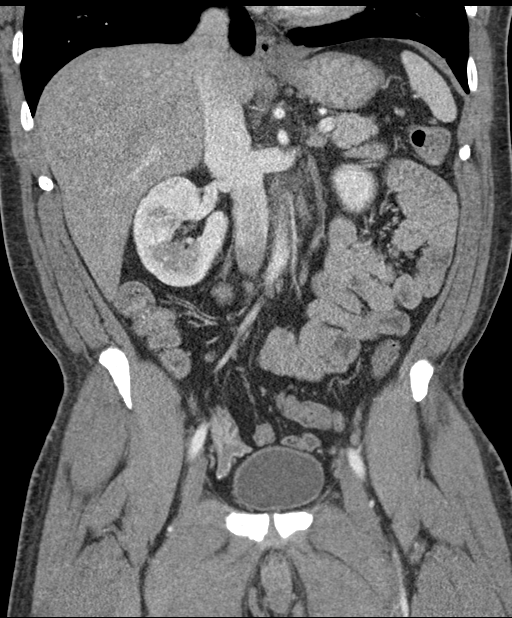
[im 56/100  soft-tissue]
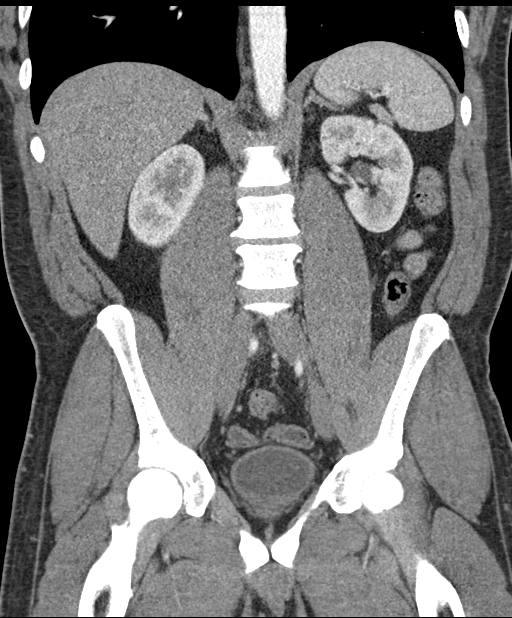

[16 of 46 positions shown; findings below may reference images not displayed]

FINDINGS: Lower chest: Linear left lower lobe scarring, unchanged. No
consolidation or pleural effusion.

Hepatobiliary: Hepatic steatosis without focal lesion. Gallbladder
physiologically distended, no calcified stone. No biliary
dilatation.

Pancreas: No ductal dilatation or inflammation.

Spleen: Normal in size without focal abnormality.

Adrenals/Urinary Tract: Normal adrenal glands. No hydronephrosis or
perinephric edema. Homogeneous renal enhancement with symmetric
excretion on delayed phase imaging. Urinary bladder is
physiologically distended without wall thickening.

Stomach/Bowel: Stomach is nondistended, no gastric wall thickening.
No evidence of bowel wall thickening, distention or inflammatory
change. Normal appendix, for example image 48 series 4. Small to
moderate colonic stool burden without colonic wall thickening or
inflammation.

Vascular/Lymphatic: Mild aortic atherosclerosis. No acute vascular
finding. No enlarged abdominal or pelvic lymph nodes.

Reproductive: Prostate is unremarkable.

Other: No free air, free fluid, or intra-abdominal fluid collection.

Musculoskeletal: Mild degenerative disc disease in the lumbar spine.
There are no acute or suspicious osseous abnormalities.
IMPRESSION: 1. No acute abnormality or explanation for abdominal pain.
2. Hepatic steatosis.
3. Mild aortic atherosclerosis ([id number]).

## 2019-11-24 MED FILL — AMLODIPINE BESYLATE 5 MG TA: 5 | 30 days supply | Qty: 30 | Fill #0

## 2019-12-05 ENCOUNTER — Other Ambulatory Visit: Payer: Self-pay

## 2019-12-05 ENCOUNTER — Ambulatory Visit: Payer: Medicaid Other | Attending: Nurse Practitioner

## 2019-12-05 DIAGNOSIS — E039 Hypothyroidism, unspecified: Secondary | ICD-10-CM | POA: Diagnosis not present

## 2019-12-06 ENCOUNTER — Other Ambulatory Visit: Payer: Self-pay | Admitting: Nurse Practitioner

## 2019-12-06 DIAGNOSIS — E039 Hypothyroidism, unspecified: Secondary | ICD-10-CM

## 2019-12-06 LAB — TSH: TSH: 1.23 u[IU]/mL (ref 0.450–4.500)

## 2019-12-06 MED ORDER — LEVOTHYROXINE SODIUM 200 MCG PO TABS: 200.0000 ug | ORAL_TABLET | Freq: Every day | ORAL | 1 refills | Status: DC

## 2019-12-06 MED FILL — LEVOTHYROXINE SODIUM 200 MC: 200 | 30 days supply | Qty: 30 | Fill #0

## 2020-01-27 MED FILL — LEVOTHYROXINE SODIUM 200 MC: 200 | 30 days supply | Qty: 30 | Fill #1

## 2020-01-27 MED FILL — AMLODIPINE BESYLATE 5 MG TA: 5 | 30 days supply | Qty: 30 | Fill #1

## 2020-01-30 ENCOUNTER — Ambulatory Visit: Payer: Medicaid Other | Admitting: Nurse Practitioner

## 2020-02-20 ENCOUNTER — Other Ambulatory Visit: Payer: Self-pay | Admitting: Nurse Practitioner

## 2020-02-20 ENCOUNTER — Encounter: Payer: Self-pay | Admitting: Nurse Practitioner

## 2020-02-20 ENCOUNTER — Other Ambulatory Visit: Payer: Self-pay

## 2020-02-20 ENCOUNTER — Ambulatory Visit: Payer: Medicaid Other | Attending: Nurse Practitioner | Admitting: Nurse Practitioner

## 2020-02-20 DIAGNOSIS — E039 Hypothyroidism, unspecified: Secondary | ICD-10-CM

## 2020-02-20 DIAGNOSIS — I10 Essential (primary) hypertension: Secondary | ICD-10-CM | POA: Diagnosis not present

## 2020-02-20 MED ORDER — LEVOTHYROXINE SODIUM 200 MCG PO TABS: 200.0000 ug | ORAL_TABLET | Freq: Every day | ORAL | 1 refills | Status: DC

## 2020-02-20 MED ORDER — AMLODIPINE BESYLATE 5 MG PO TABS: 5.0000 mg | ORAL_TABLET | Freq: Every day | ORAL | 1 refills | Status: DC

## 2020-02-20 MED FILL — AMLODIPINE BESYLATE 5 MG TA: 5 | 90 days supply | Qty: 90 | Fill #0

## 2020-02-20 MED FILL — LEVOTHYROXINE SODIUM 200 MC: 200 | 30 days supply | Qty: 30 | Fill #0

## 2020-02-20 NOTE — Progress Notes (Signed)
Virtual Visit via Telephone Note Due to national recommendations of social distancing due to COVID 19, telehealth visit is felt to be most appropriate for this patient at this time.  I discussed the limitations, risks, security and privacy concerns of performing an evaluation and management service by telephone and the availability of in person appointments. I also discussed with the patient that there may be a patient responsible charge related to this service. The patient expressed understanding and agreed to proceed.    I connected with Larry Thomas on 02/20/20  at   3:10 PM EST  EDT by telephone and verified that I am speaking with the correct person using two identifiers.   Consent I discussed the limitations, risks, security and privacy concerns of performing an evaluation and management service by telephone and the availability of in person appointments. I also discussed with the patient that there may be a patient responsible charge related to this service. The patient expressed understanding and agreed to proceed.   Location of Patient: Private Residence   [facility] Provider[facility]    Persons participating in Telemedicine visit: [name] FNP-BC YY Kimball CMA Larry Thomas    History of Present Illness: Telemedicine visit for: Follow Up  has a past medical history of Hypercholesterolemia, Hypertension, Hypothyroidism, and Thyroid disease.   Essential Hypertension Last home reading was a few weeks ago. He believes it was around 140/90. He is currently taking amlodipine 5 mg.  Denies chest pain, shortness of breath, palpitations, lightheadedness, dizziness, headaches or BLE edema.  BP Readings from Last 3 Encounters:  09/16/19 120/77  06/05/19 (!) 137/93  06/03/19 122/84   Hypothyroidism Taking levothyroxine 200 mg daily as prescribed. Endorses fatigue and lack of motivation over the past few weeks.  Lab Results  Component  Value Date   TSH 1.230 12/05/2019    Past Medical History:  Diagnosis Date   Hypercholesterolemia    Hypertension    Hypothyroidism    Mixed hyperlipidemia 05/18/2017   Thyroid disease     Past Surgical History:  Procedure Laterality Date   BILATERAL CARPAL TUNNEL RELEASE Bilateral 04/12/2015   Procedure: BILATERAL CARPAL TUNNEL RELEASE;  Surgeon: Betha Loa, MD;  Location: [facility];  Service: Orthopedics;  Laterality: Bilateral;    Family History  Problem Relation Age of Onset   Thyroid cancer Mother     Social History   Socioeconomic History   Marital status: Married    Spouse name: Not on file   Number of children: Not on file   Years of education: Not on file   Highest education level: Not on file  Occupational History   Not on file  Tobacco Use   Smoking status: Former Smoker   Smokeless tobacco: Never Used  Substance and Sexual Activity   Alcohol use: No   Drug use: No   Sexual activity: Yes  Other Topics Concern   Not on file  Social History Narrative   Patient is married.    Social Determinants of Health   Financial Resource Strain:    Difficulty of Paying Living Expenses: Not on file  Food Insecurity:    Worried About Programme researcher, broadcasting/film/video in the Last Year: Not on file   [employer] of Food in the Last Year: Not on file  Transportation Needs:    Lack of Transportation (Medical): Not on file   Lack of Transportation (Non-Medical): Not on file  Physical Activity:  Days of Exercise per Week: Not on file   Minutes of Exercise per Session: Not on file  Stress:    Feeling of Stress : Not on file  Social Connections:    Frequency of Communication with Friends and Family: Not on file   Frequency of Social Gatherings with Friends and Family: Not on file   Attends Religious Services: Not on file   Active Member of Clubs or Organizations: Not on file   Attends Banker Meetings: Not on file   Marital  Status: Not on file     Observations/Objective: Awake, alert and oriented x 3   Review of Systems  Constitutional: Positive for malaise/fatigue. Negative for fever and weight loss.  HENT: Negative.  Negative for nosebleeds.   Eyes: Negative.  Negative for blurred vision, double vision and photophobia.  Respiratory: Negative.  Negative for cough and shortness of breath.   Cardiovascular: Negative.  Negative for chest pain, palpitations and leg swelling.  Gastrointestinal: Negative.  Negative for heartburn, nausea and vomiting.  Musculoskeletal: Negative.  Negative for myalgias.  Neurological: Negative.  Negative for dizziness, focal weakness, seizures and headaches.  Psychiatric/Behavioral: Negative.  Negative for suicidal ideas.    Assessment and Plan: Larry Thomas was seen today for hypothyroidism.  Diagnoses and all orders for this visit:  Essential hypertension -     amLODipine (NORVASC) 5 MG tablet; Take 1 tablet (5 mg total) by mouth daily. Continue all antihypertensives as prescribed.  Remember to bring in your blood pressure log with you for your follow up appointment.  DASH/Mediterranean Diets are healthier choices for HTN.  BMP pending   Acquired hypothyroidism -     levothyroxine (SYNTHROID) 200 MCG tablet; Take 1 tablet (200 mcg total) by mouth daily. TSH PENDING  Follow Up Instructions Return in about 3 months (around 05/21/2020).     I discussed the assessment and treatment plan with the patient. The patient was provided an opportunity to ask questions and all were answered. The patient agreed with the plan and demonstrated an understanding of the instructions.   The patient was advised to call back or seek an in-person evaluation if the symptoms worsen or if the condition fails to improve as anticipated.  I provided 18 minutes of non-face-to-face time during this encounter including median intraservice time, reviewing previous notes, labs, imaging, medications and  explaining diagnosis and management.  Claiborne Rigg, FNP-BC

## 2020-02-23 ENCOUNTER — Ambulatory Visit: Payer: Medicaid Other | Attending: Nurse Practitioner

## 2020-02-23 ENCOUNTER — Other Ambulatory Visit: Payer: Self-pay

## 2020-02-23 DIAGNOSIS — I10 Essential (primary) hypertension: Secondary | ICD-10-CM | POA: Diagnosis not present

## 2020-02-23 DIAGNOSIS — E039 Hypothyroidism, unspecified: Secondary | ICD-10-CM | POA: Diagnosis not present

## 2020-02-24 LAB — BASIC METABOLIC PANEL
BUN/Creatinine Ratio: 10 (ref 9–20)
BUN: 10 mg/dL (ref 6–24)
CO2: 23 mmol/L (ref 20–29)
Calcium: 9.3 mg/dL (ref 8.7–10.2)
Chloride: 103 mmol/L (ref 96–106)
Creatinine, Ser: 0.97 mg/dL (ref 0.76–1.27)
GFR calc Af Amer: 109 mL/min/{1.73_m2} (ref >59)
GFR calc non Af Amer: 94 mL/min/{1.73_m2} (ref >59)
Glucose: 73 mg/dL (ref 65–99)
Potassium: 3.7 mmol/L (ref 3.5–5.2)
Sodium: 141 mmol/L (ref 134–144)

## 2020-02-24 LAB — TSH: TSH: 0.678 u[IU]/mL (ref 0.450–4.500)

## 2020-04-02 MED FILL — LEVOTHYROXINE SODIUM 200 MC: 200 | 30 days supply | Qty: 30 | Fill #1

## 2020-04-13 ENCOUNTER — Ambulatory Visit: Payer: Medicaid Other | Attending: Nurse Practitioner | Admitting: Nurse Practitioner

## 2020-04-13 ENCOUNTER — Other Ambulatory Visit: Payer: Self-pay

## 2020-04-13 ENCOUNTER — Other Ambulatory Visit: Payer: Self-pay | Admitting: Nurse Practitioner

## 2020-04-13 ENCOUNTER — Encounter: Payer: Self-pay | Admitting: Nurse Practitioner

## 2020-04-13 VITALS — BP 120/79 | HR 68 | Temp 98.7°F | Ht 69.0 in | Wt 257.0 lb

## 2020-04-13 DIAGNOSIS — I10 Essential (primary) hypertension: Secondary | ICD-10-CM | POA: Diagnosis not present

## 2020-04-13 DIAGNOSIS — E039 Hypothyroidism, unspecified: Secondary | ICD-10-CM

## 2020-04-13 DIAGNOSIS — Z1211 Encounter for screening for malignant neoplasm of colon: Secondary | ICD-10-CM

## 2020-04-13 MED ORDER — AMLODIPINE BESYLATE 5 MG PO TABS: 5.0000 mg | ORAL_TABLET | Freq: Every day | ORAL | 1 refills | Status: DC

## 2020-04-13 MED ORDER — LEVOTHYROXINE SODIUM 175 MCG PO TABS: 175.0000 ug | ORAL_TABLET | Freq: Every day | ORAL | 1 refills | Status: DC

## 2020-04-13 NOTE — Progress Notes (Signed)
Assessment & Plan:  [name] was seen today for blood pressure check.  Diagnoses and all orders for this visit:  Essential hypertension -     amLODipine (NORVASC) 5 MG tablet; Take 1 tablet (5 mg total) by mouth daily. Continue all antihypertensives as prescribed.  Remember to bring in your blood pressure log with you for your follow up appointment.  DASH/Mediterranean Diets are healthier choices for HTN.   Acquired hypothyroidism -     levothyroxine (SYNTHROID) 175 MCG tablet; Take 1 tablet (175 mcg total) by mouth daily.  Colon cancer screening -     Fecal occult blood, imunochemical([facility])    Patient has been counseled on age-appropriate routine health concerns for screening and prevention. These are reviewed and up-to-date. Referrals have been placed accordingly. Immunizations are up-to-date or declined.    Subjective:   Chief Complaint  Patient presents with   Blood Pressure Check    Patient is here for blood pressure check.    HPI Larry Thomas 45 y.o. male presents to office today for follow up.   has a past medical history of Hypercholesterolemia, Hypertension, Hypothyroidism, Mixed hyperlipidemia (05/18/2017), and Thyroid disease.   Essential Hypertension Blood pressure is well controlled with amlodipine 5 mg daily. Denies chest pain, shortness of breath, palpitations, lightheadedness, dizziness, headaches or BLE edema.  BP Readings from Last 3 Encounters:  04/13/20 120/79  09/16/19 120/77  06/05/19 (!) 137/93   Hypothyroidism Decreasing synthroid from 200 to 175. TSH has significantly decreased.  Denies fatigue,  heat/cold intolerance, bowel/skin changes or CVS symptoms. Weight has significantly increased.  Lab Results  Component Value Date   TSH 0.678 02/23/2020    The 10-year ASCVD risk score Denman George DC Montez Hageman., et al., 2013) is: 6%   Values used to calculate the score:     Age: 35 years     Sex: Male     Is Non-Hispanic African American: Yes      Diabetic: No     Tobacco smoker: No     Systolic Blood Pressure: 120 mmHg     Is BP treated: Yes     HDL Cholesterol: 43 mg/dL     Total Cholesterol: 162 mg/dL  Review of Systems  Constitutional: Negative for fever, malaise/fatigue and weight loss.       Weight gain  HENT: Negative.  Negative for nosebleeds.   Eyes: Negative.  Negative for blurred vision, double vision and photophobia.  Respiratory: Negative.  Negative for cough and shortness of breath.   Cardiovascular: Negative.  Negative for chest pain, palpitations and leg swelling.  Gastrointestinal: Negative.  Negative for heartburn, nausea and vomiting.  Musculoskeletal: Negative.  Negative for myalgias.  Neurological: Negative.  Negative for dizziness, focal weakness, seizures and headaches.  Psychiatric/Behavioral: Negative for suicidal ideas.    Past Medical History:  Diagnosis Date   Hypercholesterolemia    Hypertension    Hypothyroidism    Mixed hyperlipidemia 05/18/2017   Thyroid disease     Past Surgical History:  Procedure Laterality Date   BILATERAL CARPAL TUNNEL RELEASE Bilateral 04/12/2015   Procedure: BILATERAL CARPAL TUNNEL RELEASE;  Surgeon: Betha Loa, MD;  Location: [facility];  Service: Orthopedics;  Laterality: Bilateral;    Family History  Problem Relation Age of Onset   Thyroid cancer Mother     Social History Reviewed with no changes to be made today.   Outpatient Medications Prior to Visit  Medication Sig Dispense Refill   aspirin 81 MG chewable tablet Chew  81 mg by mouth daily as needed for mild pain.     Blood Pressure Monitor DEVI Please provide patient with insurance approved blood pressure monitor ICD 10 I10.0 1 each 0   Disposable Gloves (POWDER FREE NITRILE GLOVES XL) MISC 1 each by Does not apply route daily as needed. Please provide patient with insurance approved gloves due to COVID 19. Z91.89 100 each prn   ibuprofen (ADVIL) 200 MG tablet Take 800 mg by  mouth every 6 (six) hours as needed for moderate pain.     Masks MISC 1 each by Does not apply route daily as needed. Please provide patient with insurance approved masks due to COVID 19. Z91.89 100 each prn   Multiple Vitamin (MULTIVITAMIN WITH MINERALS) TABS tablet Take 1 tablet by mouth daily.     amLODipine (NORVASC) 5 MG tablet Take 1 tablet (5 mg total) by mouth daily. 90 tablet 1   levothyroxine (SYNTHROID) 200 MCG tablet Take 1 tablet (200 mcg total) by mouth daily. 30 tablet 1   No facility-administered medications prior to visit.    No Known Allergies     Objective:    BP 120/79 (BP Location: Left Arm, Patient Position: Sitting, Cuff Size: Large)    Pulse 68    Temp 98.7 F (37.1 C) (Oral)    Ht 5\' 9"  (1.753 m)    Wt 257 lb (116.6 kg)    SpO2 100%    BMI 37.95 kg/m  Wt Readings from Last 3 Encounters:  04/13/20 257 lb (116.6 kg)  09/16/19 223 lb (101.2 kg)  06/20/19 220 lb 9.6 oz (100.1 kg)    Physical Exam Vitals and nursing note reviewed.  Constitutional:      Appearance: He is well-developed and well-nourished.  HENT:     Head: Normocephalic and atraumatic.  Eyes:     Extraocular Movements: EOM normal.  Cardiovascular:     Rate and Rhythm: Normal rate and regular rhythm.     Pulses: Intact distal pulses.     Heart sounds: Normal heart sounds. No murmur heard. No friction rub. No gallop.   Pulmonary:     Effort: Pulmonary effort is normal. No tachypnea or respiratory distress.     Breath sounds: Normal breath sounds. No decreased breath sounds, wheezing, rhonchi or rales.  Chest:     Chest wall: No tenderness.  Abdominal:     General: Bowel sounds are normal.     Palpations: Abdomen is soft.  Musculoskeletal:        General: No edema. Normal range of motion.     Cervical back: Normal range of motion.  Skin:    General: Skin is warm and dry.  Neurological:     Mental Status: He is alert and oriented to person, place, and time.     Coordination:  Coordination normal.  Psychiatric:        Mood and Affect: Mood and affect normal.        Behavior: Behavior normal. Behavior is cooperative.        Thought Content: Thought content normal.        Judgment: Judgment normal.          Patient has been counseled extensively about nutrition and exercise as well as the importance of adherence with medications and regular follow-up. The patient was given clear instructions to go to ER or return to medical center if symptoms don't improve, worsen or new problems develop. The patient verbalized understanding.   Follow-up: Return for  6 weeks lab. See me in 3 months.   Claiborne Rigg, FNP-BC [facility] [place], [place] [phone]   04/13/2020, 7:44 PM

## 2020-04-16 MED FILL — LEVOTHYROXINE SODIUM 175 MC: 175 | 30 days supply | Qty: 30 | Fill #0

## 2020-04-27 MED FILL — LEVOTHYROXINE SODIUM 175 MC: 175 | 30 days supply | Qty: 30 | Fill #0

## 2020-05-28 ENCOUNTER — Other Ambulatory Visit: Payer: Self-pay | Admitting: Nurse Practitioner

## 2020-05-28 ENCOUNTER — Ambulatory Visit: Payer: Medicaid Other | Attending: Nurse Practitioner

## 2020-05-28 ENCOUNTER — Other Ambulatory Visit: Payer: Self-pay

## 2020-05-28 DIAGNOSIS — E782 Mixed hyperlipidemia: Secondary | ICD-10-CM

## 2020-05-28 DIAGNOSIS — I10 Essential (primary) hypertension: Secondary | ICD-10-CM

## 2020-05-28 DIAGNOSIS — E039 Hypothyroidism, unspecified: Secondary | ICD-10-CM

## 2020-05-28 MED FILL — LEVOTHYROXINE SODIUM 175 MC: 175 | 30 days supply | Qty: 30 | Fill #1

## 2020-05-28 MED FILL — AMLODIPINE BESYLATE 5 MG TA: 5 | 30 days supply | Qty: 30 | Fill #1

## 2020-05-29 LAB — CMP14+EGFR
ALT: 21 IU/L (ref 0–44)
AST: 21 IU/L (ref 0–40)
Albumin/Globulin Ratio: 1.4 (ref 1.2–2.2)
Albumin: 4.5 g/dL (ref 4.0–5.0)
Alkaline Phosphatase: 68 IU/L (ref 44–121)
BUN/Creatinine Ratio: 9 (ref 9–20)
BUN: 9 mg/dL (ref 6–24)
Bilirubin Total: 0.4 mg/dL (ref 0.0–1.2)
CO2: 20 mmol/L (ref 20–29)
Calcium: 9.2 mg/dL (ref 8.7–10.2)
Chloride: 103 mmol/L (ref 96–106)
Creatinine, Ser: 0.97 mg/dL (ref 0.76–1.27)
Globulin, Total: 3.3 g/dL (ref 1.5–4.5)
Glucose: 92 mg/dL (ref 65–99)
Potassium: 4.3 mmol/L (ref 3.5–5.2)
Sodium: 141 mmol/L (ref 134–144)
Total Protein: 7.8 g/dL (ref 6.0–8.5)
eGFR: 98 mL/min/{1.73_m2} (ref >59)

## 2020-05-29 LAB — LIPID PANEL
Chol/HDL Ratio: 4.2 ratio (ref 0.0–5.0)
Cholesterol, Total: 172 mg/dL (ref 100–199)
HDL: 41 mg/dL (ref >39)
LDL Chol Calc (NIH): 117 mg/dL — ABNORMAL HIGH (ref 0–99)
Triglycerides: 74 mg/dL (ref 0–149)
VLDL Cholesterol Cal: 14 mg/dL (ref 5–40)

## 2020-05-29 LAB — TSH: TSH: 3.84 u[IU]/mL (ref 0.450–4.500)

## 2020-06-04 ENCOUNTER — Other Ambulatory Visit: Payer: Self-pay | Admitting: Nurse Practitioner

## 2020-06-04 DIAGNOSIS — E039 Hypothyroidism, unspecified: Secondary | ICD-10-CM

## 2020-06-04 MED ORDER — LEVOTHYROXINE SODIUM 175 MCG PO TABS: 175.0000 ug | ORAL_TABLET | Freq: Every day | ORAL | 0 refills | Status: DC

## 2020-06-23 ENCOUNTER — Other Ambulatory Visit: Payer: Self-pay

## 2020-07-04 MED FILL — Levothyroxine Sodium Tab 175 MCG: ORAL | 31 days supply | Qty: 31 | Fill #0 | Status: CN

## 2020-07-04 MED FILL — Amlodipine Besylate Tab 5 MG (Base Equivalent): ORAL | 31 days supply | Qty: 31 | Fill #0 | Status: CN

## 2020-07-05 ENCOUNTER — Other Ambulatory Visit: Payer: Self-pay

## 2020-07-09 ENCOUNTER — Other Ambulatory Visit: Payer: Self-pay

## 2020-07-09 ENCOUNTER — Ambulatory Visit: Payer: Medicaid Other | Attending: Nurse Practitioner | Admitting: Nurse Practitioner

## 2020-07-09 ENCOUNTER — Encounter: Payer: Self-pay | Admitting: Nurse Practitioner

## 2020-07-09 VITALS — BP 145/90 | HR 69 | Resp 17 | Ht 69.0 in | Wt 268.6 lb

## 2020-07-09 DIAGNOSIS — Z1211 Encounter for screening for malignant neoplasm of colon: Secondary | ICD-10-CM

## 2020-07-09 DIAGNOSIS — I10 Essential (primary) hypertension: Secondary | ICD-10-CM

## 2020-07-09 DIAGNOSIS — E782 Mixed hyperlipidemia: Secondary | ICD-10-CM | POA: Diagnosis not present

## 2020-07-09 DIAGNOSIS — E039 Hypothyroidism, unspecified: Secondary | ICD-10-CM

## 2020-07-09 MED ORDER — AMLODIPINE BESYLATE 5 MG PO TABS
ORAL_TABLET | Freq: Every day | ORAL | 1 refills | Status: DC
  Filled 2020-07-09: qty 30, 30d supply, fill #0
  Filled 2020-07-09: qty 90, fill #0
  Filled 2020-08-03: qty 30, 30d supply, fill #1
  Filled 2020-09-03: qty 30, 30d supply, fill #2
  Filled 2020-10-03: qty 30, 30d supply, fill #3

## 2020-07-09 MED ORDER — LEVOTHYROXINE SODIUM 175 MCG PO TABS
ORAL_TABLET | ORAL | 0 refills | Status: DC
  Filled 2020-07-09: qty 30, 30d supply, fill #0
  Filled 2020-07-09: qty 90, fill #0
  Filled 2020-08-03: qty 30, 30d supply, fill #1
  Filled 2020-09-03: qty 30, 30d supply, fill #2

## 2020-07-09 NOTE — Progress Notes (Signed)
Assessment & Plan:  [name] was seen today for 3 month follow up .  Diagnoses and all orders for this visit:  Essential hypertension -     amLODipine (NORVASC) 5 MG tablet; TAKE 1 TABLET (5 MG TOTAL) BY MOUTH DAILY Continue all antihypertensives as prescribed.  Remember to bring in your blood pressure log with you for your follow up appointment.  DASH/Mediterranean Diets are healthier choices for HTN.  .  Mixed hyperlipidemia INSTRUCTIONS: Work on a low fat, heart healthy diet and participate in regular aerobic exercise program by working out at least 150 minutes per week; 5 days a week-30 minutes per day. Avoid red meat/beef/steak,  fried foods. junk foods, sodas, sugary drinks, unhealthy snacking, alcohol and smoking.  Drink at least 80 oz of water per day and monitor your carbohydrate intake daily.    Acquired hypothyroidism -     levothyroxine (SYNTHROID) 175 MCG tablet; TAKE 1 TABLET (175 MCG TOTAL) BY MOUTH DAILY.  Colon cancer screening -     Fecal occult blood, imunochemical([facility])    Patient has been counseled on age-appropriate routine health concerns for screening and prevention. These are reviewed and up-to-date. Referrals have been placed accordingly. Immunizations are up-to-date or declined.    Subjective:   Chief Complaint  Patient presents with  . 3 Month follow up    HPI Larry Thomas 45 y.o. male presents to office today for follow up.  has a past medical history of Hypercholesterolemia, Hypertension, Hypothyroidism, Mixed hyperlipidemia (05/18/2017), and Thyroid disease.    Essential Hypertension He has been out of his medication for 4 days (amlodipine 5mg ). Blood pressure is elevated today. Denies chest pain, shortness of breath, palpitations, lightheadedness, dizziness, headaches or BLE edema.  BP Readings from Last 3 Encounters:  07/09/20 (!) 145/90  04/13/20 120/79  09/16/19 120/77   Dyslipidemia The 10-year ASCVD risk score 09/18/19  DC Jr., et al., 2013) is: 6.2%   Values used to calculate the score:     Age: 58 years     Sex: Male     Is Non-Hispanic African American: Yes     Diabetic: No     Tobacco smoker: No     Systolic Blood Pressure: 120 mmHg     Is BP treated: Yes     HDL Cholesterol: 41 mg/dL     Total Cholesterol: 172 mg/dL    Review of Systems  Constitutional: Negative for fever, malaise/fatigue and weight loss.  HENT: Negative.  Negative for nosebleeds.   Eyes: Negative.  Negative for blurred vision, double vision and photophobia.  Respiratory: Negative.  Negative for cough and shortness of breath.   Cardiovascular: Negative.  Negative for chest pain, palpitations and leg swelling.  Gastrointestinal: Negative.  Negative for heartburn, nausea and vomiting.  Musculoskeletal: Negative.  Negative for myalgias.  Neurological: Negative.  Negative for dizziness, focal weakness, seizures and headaches.  Psychiatric/Behavioral: Negative.  Negative for suicidal ideas.    Past Medical History:  Diagnosis Date  . Hypercholesterolemia   . Hypertension   . Hypothyroidism   . Mixed hyperlipidemia 05/18/2017  . Thyroid disease     Past Surgical History:  Procedure Laterality Date  . BILATERAL CARPAL TUNNEL RELEASE Bilateral 04/12/2015   Procedure: BILATERAL CARPAL TUNNEL RELEASE;  Surgeon: 04/14/2015, MD;  Location: [facility];  Service: Orthopedics;  Laterality: Bilateral;    Family History  Problem Relation Age of Onset  . Thyroid cancer Mother     Social History Reviewed with  no changes to be made today.   Outpatient Medications Prior to Visit  Medication Sig Dispense Refill  . aspirin 81 MG chewable tablet Chew 81 mg by mouth daily as needed for mild pain.    . Blood Pressure Monitor DEVI Please provide patient with insurance approved blood pressure monitor ICD 10 I10.0 1 each 0  . Disposable Gloves (POWDER FREE NITRILE GLOVES XL) MISC 1 each by Does not apply route daily as  needed. Please provide patient with insurance approved gloves due to COVID 19. Z91.89 100 each prn  . ibuprofen (ADVIL) 200 MG tablet Take 800 mg by mouth every 6 (six) hours as needed for moderate pain.    . Masks MISC 1 each by Does not apply route daily as needed. Please provide patient with insurance approved masks due to COVID 19. Z91.89 100 each prn  . Multiple Vitamin (MULTIVITAMIN WITH MINERALS) TABS tablet Take 1 tablet by mouth daily.    Marland Kitchen amLODipine (NORVASC) 5 MG tablet TAKE 1 TABLET (5 MG TOTAL) BY MOUTH DAILY. 90 tablet 1  . levothyroxine (SYNTHROID) 175 MCG tablet TAKE 1 TABLET (175 MCG TOTAL) BY MOUTH DAILY. 90 tablet 0   No facility-administered medications prior to visit.    No Known Allergies     Objective:    BP (!) 145/90   Pulse 69   Resp 17   Ht 5\' 9"  (1.753 m)   Wt 268 lb 9.6 oz (121.8 kg)   SpO2 97%   BMI 39.67 kg/m  Wt Readings from Last 3 Encounters:  07/09/20 268 lb 9.6 oz (121.8 kg)  04/13/20 257 lb (116.6 kg)  09/16/19 223 lb (101.2 kg)    Physical Exam Vitals and nursing note reviewed.  Constitutional:      Appearance: He is well-developed.  HENT:     Head: Normocephalic and atraumatic.  Cardiovascular:     Rate and Rhythm: Normal rate and regular rhythm.     Heart sounds: Normal heart sounds. No murmur heard. No friction rub. No gallop.   Pulmonary:     Effort: Pulmonary effort is normal. No tachypnea or respiratory distress.     Breath sounds: Normal breath sounds. No decreased breath sounds, wheezing, rhonchi or rales.  Chest:     Chest wall: No tenderness.  Abdominal:     General: Bowel sounds are normal.     Palpations: Abdomen is soft.  Musculoskeletal:        General: Normal range of motion.     Cervical back: Normal range of motion.  Skin:    General: Skin is warm and dry.  Neurological:     Mental Status: He is alert and oriented to person, place, and time.     Coordination: Coordination normal.  Psychiatric:         Behavior: Behavior normal. Behavior is cooperative.        Thought Content: Thought content normal.        Judgment: Judgment normal.          Patient has been counseled extensively about nutrition and exercise as well as the importance of adherence with medications and regular follow-up. The patient was given clear instructions to go to ER or return to medical center if symptoms don't improve, worsen or new problems develop. The patient verbalized understanding.   Follow-up: Return in about 3 months (around 10/08/2020).   10/10/2020, FNP-BC [facility] and [facility] [place], [place]   07/09/2020, 10:33 AM

## 2020-07-12 ENCOUNTER — Other Ambulatory Visit: Payer: Self-pay

## 2020-07-12 LAB — FECAL OCCULT BLOOD, IMMUNOCHEMICAL: Fecal Occult Bld: NEGATIVE

## 2020-08-06 ENCOUNTER — Other Ambulatory Visit: Payer: Self-pay

## 2020-09-04 ENCOUNTER — Other Ambulatory Visit: Payer: Self-pay

## 2020-09-05 ENCOUNTER — Other Ambulatory Visit: Payer: Self-pay

## 2020-10-03 ENCOUNTER — Other Ambulatory Visit: Payer: Self-pay | Admitting: Nurse Practitioner

## 2020-10-03 ENCOUNTER — Other Ambulatory Visit: Payer: Self-pay

## 2020-10-03 DIAGNOSIS — E039 Hypothyroidism, unspecified: Secondary | ICD-10-CM

## 2020-10-03 MED ORDER — LEVOTHYROXINE SODIUM 175 MCG PO TABS
ORAL_TABLET | ORAL | 0 refills | Status: DC
  Filled 2020-10-03: qty 30, 30d supply, fill #0

## 2020-10-05 ENCOUNTER — Other Ambulatory Visit: Payer: Self-pay

## 2020-10-12 ENCOUNTER — Ambulatory Visit: Payer: 59 | Attending: Nurse Practitioner | Admitting: Family Medicine

## 2020-10-12 ENCOUNTER — Other Ambulatory Visit: Payer: Self-pay

## 2020-10-12 VITALS — BP 136/82 | HR 63 | Resp 16 | Wt 259.0 lb

## 2020-10-12 DIAGNOSIS — E039 Hypothyroidism, unspecified: Secondary | ICD-10-CM

## 2020-10-12 DIAGNOSIS — M85642 Other cyst of bone, left hand: Secondary | ICD-10-CM

## 2020-10-12 DIAGNOSIS — I10 Essential (primary) hypertension: Secondary | ICD-10-CM | POA: Diagnosis not present

## 2020-10-12 DIAGNOSIS — E0590 Thyrotoxicosis, unspecified without thyrotoxic crisis or storm: Secondary | ICD-10-CM | POA: Insufficient documentation

## 2020-10-12 DIAGNOSIS — Z6835 Body mass index (BMI) 35.0-35.9, adult: Secondary | ICD-10-CM | POA: Diagnosis not present

## 2020-10-12 MED ORDER — LEVOTHYROXINE SODIUM 175 MCG PO TABS
175.0000 ug | ORAL_TABLET | Freq: Every day | ORAL | 1 refills | Status: DC
  Filled 2020-10-12: qty 90, 90d supply, fill #0
  Filled 2020-10-30: qty 30, 30d supply, fill #0
  Filled 2020-11-29: qty 30, 30d supply, fill #1
  Filled 2021-01-01: qty 30, 30d supply, fill #2
  Filled 2021-01-31: qty 30, 30d supply, fill #3
  Filled 2021-03-05: qty 30, 30d supply, fill #4
  Filled 2021-03-28: qty 30, 30d supply, fill #5
  Filled 2021-03-29: qty 30, 30d supply, fill #0

## 2020-10-12 MED ORDER — AMLODIPINE BESYLATE 5 MG PO TABS
5.0000 mg | ORAL_TABLET | Freq: Every day | ORAL | 1 refills | Status: DC
  Filled 2020-10-12: qty 90, 90d supply, fill #0
  Filled 2020-10-30: qty 30, 30d supply, fill #0
  Filled 2020-11-29: qty 30, 30d supply, fill #1
  Filled 2021-01-01: qty 30, 30d supply, fill #2
  Filled 2021-01-31: qty 30, 30d supply, fill #3
  Filled 2021-03-05: qty 30, 30d supply, fill #4
  Filled 2021-03-28: qty 30, 30d supply, fill #5
  Filled 2021-03-29: qty 30, 30d supply, fill #0

## 2020-10-12 NOTE — Progress Notes (Signed)
Established Patient Office Visit  Subjective:  Patient ID: Larry Thomas, male    DOB: [birth year]  Age: 45 y.o. MRN: [id number]  CC:  Chief Complaint  Patient presents with   Hypertension    HPI Larry Thomas presents for hypertension and hypothyroidism follow-up.  Hypertension- monitors readings at home.  Per patient home readings have ranged similar to in office reading today which is less than 140/90. Denies SOB, swelling, CP, or dizziness. Tolerating Amlodipine without A.E.  Hypothyroidism- stable Levothyroxine 175 mcg. Last TSH x 4 months prior  3.8 Denies overly excessive fatigue, hair thinning, or unintentional weight gain.  Left hand cyst Onset : > 1 year ago. Location: Distal left palmar services  He is requesting evaluation to have the mass removed and needs a speciality referral.  Past Medical History:  Diagnosis Date   Hypercholesterolemia    Hypertension    Hypothyroidism    Mixed hyperlipidemia 05/18/2017   Thyroid disease     Past Surgical History:  Procedure Laterality Date   BILATERAL CARPAL TUNNEL RELEASE Bilateral 04/12/2015   Procedure: BILATERAL CARPAL TUNNEL RELEASE;  Surgeon: Leanora Cover, MD;  Location: [facility];  Service: Orthopedics;  Laterality: Bilateral;    Family History  Problem Relation Age of Onset   Thyroid cancer Mother     Social History   Socioeconomic History   Marital status: Married    Spouse name: Not on file   Number of children: Not on file   Years of education: Not on file   Highest education level: Not on file  Occupational History   Not on file  Tobacco Use   Smoking status: Former   Smokeless tobacco: Never  Substance and Sexual Activity   Alcohol use: No   Drug use: No   Sexual activity: Yes  Other Topics Concern   Not on file  Social History Narrative   Patient is married.    Social Determinants of Health   Financial Resource Strain: Not on file  Food Insecurity: Not on file   Transportation Needs: Not on file  Physical Activity: Not on file  Stress: Not on file  Social Connections: Not on file  Intimate Partner Violence: Not on file    Outpatient Medications Prior to Visit  Medication Sig Dispense Refill   amLODipine (NORVASC) 5 MG tablet TAKE 1 TABLET (5 MG TOTAL) BY MOUTH DAILY. 90 tablet 1   aspirin 81 MG chewable tablet Chew 81 mg by mouth daily as needed for mild pain.     Blood Pressure Monitor DEVI Please provide patient with insurance approved blood pressure monitor ICD 10 I10.0 1 each 0   Disposable Gloves (POWDER FREE NITRILE GLOVES XL) MISC 1 each by Does not apply route daily as needed. Please provide patient with insurance approved gloves due to COVID 19. Z91.89 100 each prn   ibuprofen (ADVIL) 200 MG tablet Take 800 mg by mouth every 6 (six) hours as needed for moderate pain.     levothyroxine (SYNTHROID) 175 MCG tablet TAKE 1 TABLET (175 MCG TOTAL) BY MOUTH DAILY. 90 tablet 0   Masks MISC 1 each by Does not apply route daily as needed. Please provide patient with insurance approved masks due to COVID 19. Z91.89 100 each prn   Multiple Vitamin (MULTIVITAMIN WITH MINERALS) TABS tablet Take 1 tablet by mouth daily.     No facility-administered medications prior to visit.    No Known Allergies  ROS Review of Systems Pertinent negatives  listed in HPI    Objective:    Physical Exam BP 136/82   Pulse 63   Resp 16   Wt 259 lb (117.5 kg)   SpO2 97%   BMI 38.25 kg/m    Physical Exam: Constitutional: Patient appears well-developed and well-nourished. No distress.  Eyes: Conjunctivae and EOM are normal. PERRLA, no scleral icterus. Neck: Normal ROM. Neck supple. No JVD. No tracheal deviation. No thyromegaly. CVS: RRR, S1/S2 +, no murmurs, no gallops, no carotid bruit.  Pulmonary: Effort and breath sounds normal, no stridor, rhonchi, wheezes, rales.  Abdominal: Soft. BS +, no distension, tenderness, rebound or guarding.  Musculoskeletal:  Left hand hardened cyst-like mass (lower left medial lateral palm),  Full ROM. No gross deformity. No edema and no tenderness.  Neuro: Alert. Normal reflexes, muscle tone coordination. No cranial nerve deficit. Skin: Skin is warm and dry. No rash noted. Not diaphoretic. No erythema. No pallor. Psychiatric: Normal mood and affect. Behavior, judgment, thought content normal.       Wt Readings from Last 3 Encounters:  10/12/20 259 lb (117.5 kg)  07/09/20 268 lb 9.6 oz (121.8 kg)  04/13/20 257 lb (116.6 kg)     Health Maintenance Due  Topic Date Due   COVID-19 Vaccine (1) Never done   Hepatitis C Screening  Never done   TETANUS/TDAP  Never done    There are no preventive care reminders to display for this patient.  Lab Results  Component Value Date   TSH 3.840 05/28/2020   Lab Results  Component Value Date   WBC 6.6 06/05/2019   HGB 15.8 06/05/2019   HCT 47.6 06/05/2019   MCV 85.5 06/05/2019   PLT 267 06/05/2019   Lab Results  Component Value Date   NA 141 05/28/2020   K 4.3 05/28/2020   CO2 20 05/28/2020   GLUCOSE 92 05/28/2020   BUN 9 05/28/2020   CREATININE 0.97 05/28/2020   BILITOT 0.4 05/28/2020   ALKPHOS 68 05/28/2020   AST 21 05/28/2020   ALT 21 05/28/2020   PROT 7.8 05/28/2020   ALBUMIN 4.5 05/28/2020   CALCIUM 9.2 05/28/2020   ANIONGAP 13 06/05/2019   EGFR 98 05/28/2020   Lab Results  Component Value Date   CHOL 172 05/28/2020   Lab Results  Component Value Date   HDL 41 05/28/2020   Lab Results  Component Value Date   LDLCALC 117 (H) 05/28/2020   Lab Results  Component Value Date   TRIG 74 05/28/2020   Lab Results  Component Value Date   CHOLHDL 4.2 05/28/2020      Assessment & Plan:   Problem List Items Addressed This Visit   None Visit Diagnoses     Hyperthyroidism    -  Primary   Relevant Orders   TSH   BMI 35.0-35.9,adult       Relevant Orders   Hemoglobin A1c       Meds ordered this encounter  Medications    levothyroxine (SYNTHROID) 175 MCG tablet    Sig: Take 1 tablet (175 mcg total) by mouth daily before breakfast.    Dispense:  90 tablet    Refill:  1    Please fill as a 90 day supply   amLODipine (NORVASC) 5 MG tablet    Sig: Take 1 tablet (5 mg total) by mouth daily.    Dispense:  90 tablet    Refill:  1    Please fill as a 90 day supply  Follow-up:  6 month follow-up repeat lipid and hypertension follow-up   Molli Barrows, FNP

## 2020-10-12 NOTE — Patient Instructions (Addendum)
You have been referred to hand specialist, Dr.Gramig, [facility]   [address], [place], [place] [address] [phone]  Someone from their office will call to schedule your visit.  Blood pressure looks great.  We will notify you of any abnormal labs.

## 2020-10-13 LAB — HEMOGLOBIN A1C
Est. average glucose Bld gHb Est-mCnc: 105 mg/dL
Hgb A1c MFr Bld: 5.3 % (ref 4.8–5.6)

## 2020-10-13 LAB — TSH: TSH: 2.34 u[IU]/mL (ref 0.450–4.500)

## 2020-10-31 ENCOUNTER — Other Ambulatory Visit: Payer: Self-pay

## 2020-11-01 ENCOUNTER — Other Ambulatory Visit: Payer: Self-pay

## 2020-11-29 ENCOUNTER — Other Ambulatory Visit: Payer: Self-pay

## 2020-12-10 DIAGNOSIS — M67432 Ganglion, left wrist: Secondary | ICD-10-CM | POA: Diagnosis not present

## 2020-12-25 DIAGNOSIS — M67432 Ganglion, left wrist: Secondary | ICD-10-CM | POA: Diagnosis not present

## 2021-01-01 ENCOUNTER — Other Ambulatory Visit: Payer: Self-pay

## 2021-01-03 ENCOUNTER — Other Ambulatory Visit: Payer: Self-pay

## 2021-01-31 ENCOUNTER — Other Ambulatory Visit: Payer: Self-pay

## 2021-03-05 ENCOUNTER — Other Ambulatory Visit: Payer: Self-pay

## 2021-03-29 ENCOUNTER — Other Ambulatory Visit: Payer: Self-pay

## 2021-03-30 ENCOUNTER — Other Ambulatory Visit: Payer: Self-pay

## 2021-04-01 ENCOUNTER — Other Ambulatory Visit: Payer: Self-pay

## 2021-04-12 ENCOUNTER — Ambulatory Visit: Payer: 59 | Attending: Nurse Practitioner | Admitting: Nurse Practitioner

## 2021-04-12 ENCOUNTER — Other Ambulatory Visit: Payer: Self-pay

## 2021-04-12 ENCOUNTER — Encounter: Payer: Self-pay | Admitting: Nurse Practitioner

## 2021-04-12 DIAGNOSIS — I10 Essential (primary) hypertension: Secondary | ICD-10-CM | POA: Diagnosis not present

## 2021-04-12 DIAGNOSIS — E039 Hypothyroidism, unspecified: Secondary | ICD-10-CM

## 2021-04-12 MED ORDER — AMLODIPINE BESYLATE 5 MG PO TABS
5.0000 mg | ORAL_TABLET | Freq: Every day | ORAL | 1 refills | Status: DC
  Filled 2021-04-12 – 2021-05-03 (×2): qty 30, 30d supply, fill #0
  Filled 2021-06-03: qty 30, 30d supply, fill #1
  Filled 2021-07-03: qty 30, 30d supply, fill #2
  Filled 2021-08-01: qty 30, 30d supply, fill #3
  Filled 2021-08-29: qty 30, 30d supply, fill #4
  Filled 2021-09-30: qty 30, 30d supply, fill #5

## 2021-04-12 MED ORDER — LEVOTHYROXINE SODIUM 175 MCG PO TABS
175.0000 ug | ORAL_TABLET | Freq: Every day | ORAL | 1 refills | Status: DC
  Filled 2021-04-12: qty 90, 90d supply, fill #0
  Filled 2021-05-03: qty 30, 30d supply, fill #0
  Filled 2021-06-03: qty 30, 30d supply, fill #1
  Filled 2021-07-03: qty 30, 30d supply, fill #2
  Filled 2021-08-01: qty 30, 30d supply, fill #3

## 2021-04-12 NOTE — Progress Notes (Signed)
Assessment & Plan:  Larry Thomas was seen today for hypertension.  Diagnoses and all orders for this visit:  Acquired hypothyroidism -     levothyroxine (SYNTHROID) 175 MCG tablet; Take 1 tablet (175 mcg total) by mouth daily before breakfast. -     Thyroid Panel With TSH  Essential hypertension -     amLODipine (NORVASC) 5 MG tablet; Take 1 tablet (5 mg total) by mouth daily. -     CMP14+EGFR    Patient has been counseled on age-appropriate routine health concerns for screening and prevention. These are reviewed and up-to-date. Referrals have been placed accordingly. Immunizations are up-to-date or declined.    Subjective:   Chief Complaint  Patient presents with   Hypertension   HPI Larry Thomas 46 y.o. male presents to office today for follow up to HTN and Hypothyroidism. He has a past medical history of Hypercholesterolemia, Hypertension, Hypothyroidism, Mixed hyperlipidemia (05/18/2017), and Thyroid disease.     HTN Blood pressure is well controlled. He is taking amlodipine 5 mg daily as prescribed. Weight is stable.  BP Readings from Last 3 Encounters:  04/12/21 116/78  10/12/20 136/82  07/09/20 (!) 145/90     Hypothyroidism Thyroid levels stable. He denies any symptoms of hypo or hyperthyroidism. Taking synthroid 175 mg daily as prescribed Lab Results  Component Value Date   TSH 2.340 10/12/2020    Review of Systems  Constitutional:  Negative for fever, malaise/fatigue and weight loss.  HENT: Negative.  Negative for nosebleeds.   Eyes: Negative.  Negative for blurred vision, double vision and photophobia.  Respiratory: Negative.  Negative for cough and shortness of breath.   Cardiovascular: Negative.  Negative for chest pain, palpitations and leg swelling.  Gastrointestinal: Negative.  Negative for heartburn, nausea and vomiting.  Musculoskeletal: Negative.  Negative for myalgias.  Neurological: Negative.  Negative for dizziness, focal weakness, seizures and  headaches.  Psychiatric/Behavioral: Negative.  Negative for suicidal ideas.    Past Medical History:  Diagnosis Date   Hypercholesterolemia    Hypertension    Hypothyroidism    Mixed hyperlipidemia 05/18/2017   Thyroid disease     Past Surgical History:  Procedure Laterality Date   BILATERAL CARPAL TUNNEL RELEASE Bilateral 04/12/2015   Procedure: BILATERAL CARPAL TUNNEL RELEASE;  Surgeon: Leanora Cover, MD;  Location: [facility];  Service: Orthopedics;  Laterality: Bilateral;    Family History  Problem Relation Age of Onset   Thyroid cancer Mother     Social History Reviewed with no changes to be made today.   Outpatient Medications Prior to Visit  Medication Sig Dispense Refill   aspirin 81 MG chewable tablet Chew 81 mg by mouth daily as needed for mild pain.     Blood Pressure Monitor DEVI Please provide patient with insurance approved blood pressure monitor ICD 10 I10.0 1 each 0   Disposable Gloves (POWDER FREE NITRILE GLOVES XL) MISC 1 each by Does not apply route daily as needed. Please provide patient with insurance approved gloves due to COVID 19. Z91.89 100 each prn   ibuprofen (ADVIL) 200 MG tablet Take 800 mg by mouth every 6 (six) hours as needed for moderate pain.     Masks MISC 1 each by Does not apply route daily as needed. Please provide patient with insurance approved masks due to COVID 19. Z91.89 100 each prn   Multiple Vitamin (MULTIVITAMIN WITH MINERALS) TABS tablet Take 1 tablet by mouth daily.     amLODipine (NORVASC) 5 MG tablet  Take 1 tablet (5 mg total) by mouth daily. 90 tablet 1   levothyroxine (SYNTHROID) 175 MCG tablet Take 1 tablet (175 mcg total) by mouth daily before breakfast. 90 tablet 1   No facility-administered medications prior to visit.    No Known Allergies     Objective:    BP 116/78    Pulse 67    Ht 5' 9"  (1.753 m)    Wt 261 lb (118.4 kg)    SpO2 98%    BMI 38.54 kg/m  Wt Readings from Last 3 Encounters:  04/12/21 261  lb (118.4 kg)  10/12/20 259 lb (117.5 kg)  07/09/20 268 lb 9.6 oz (121.8 kg)    Physical Exam Vitals and nursing note reviewed.  Constitutional:      Appearance: He is well-developed.  HENT:     Head: Normocephalic and atraumatic.  Cardiovascular:     Rate and Rhythm: Normal rate and regular rhythm.     Heart sounds: Normal heart sounds. No murmur heard.   No friction rub. No gallop.  Pulmonary:     Effort: Pulmonary effort is normal. No tachypnea or respiratory distress.     Breath sounds: Normal breath sounds. No decreased breath sounds, wheezing, rhonchi or rales.  Chest:     Chest wall: No tenderness.  Abdominal:     General: Bowel sounds are normal.     Palpations: Abdomen is soft.  Musculoskeletal:        General: Normal range of motion.     Cervical back: Normal range of motion.  Skin:    General: Skin is warm and dry.  Neurological:     Mental Status: He is alert and oriented to person, place, and time.     Coordination: Coordination normal.  Psychiatric:        Behavior: Behavior normal. Behavior is cooperative.        Thought Content: Thought content normal.        Judgment: Judgment normal.         Patient has been counseled extensively about nutrition and exercise as well as the importance of adherence with medications and regular follow-up. The patient was given clear instructions to go to ER or return to medical center if symptoms don't improve, worsen or new problems develop. The patient verbalized understanding.   Follow-up: No follow-ups on file.   [name], FNP-BC [facility] and [facility], [place]   04/12/2021, 11:07 AM

## 2021-04-13 LAB — CMP14+EGFR
ALT: 24 IU/L (ref 0–44)
AST: 23 IU/L (ref 0–40)
Albumin/Globulin Ratio: 1.5 (ref 1.2–2.2)
Albumin: 4.6 g/dL (ref 4.0–5.0)
Alkaline Phosphatase: 76 IU/L (ref 44–121)
BUN/Creatinine Ratio: 10 (ref 9–20)
BUN: 10 mg/dL (ref 6–24)
Bilirubin Total: 0.5 mg/dL (ref 0.0–1.2)
CO2: 23 mmol/L (ref 20–29)
Calcium: 9.7 mg/dL (ref 8.7–10.2)
Chloride: 103 mmol/L (ref 96–106)
Creatinine, Ser: 1 mg/dL (ref 0.76–1.27)
Globulin, Total: 3.1 g/dL (ref 1.5–4.5)
Glucose: 83 mg/dL (ref 70–99)
Potassium: 4.4 mmol/L (ref 3.5–5.2)
Sodium: 141 mmol/L (ref 134–144)
Total Protein: 7.7 g/dL (ref 6.0–8.5)
eGFR: 94 mL/min/{1.73_m2} (ref >59)

## 2021-04-13 LAB — THYROID PANEL WITH TSH
Free Thyroxine Index: 3.1 (ref 1.2–4.9)
T3 Uptake Ratio: 27 % (ref 24–39)
T4, Total: 11.5 ug/dL (ref 4.5–12.0)
TSH: 2.1 u[IU]/mL (ref 0.450–4.500)

## 2021-04-22 ENCOUNTER — Encounter: Payer: Self-pay | Admitting: Nurse Practitioner

## 2021-04-23 NOTE — Telephone Encounter (Signed)
Forms printed and placed in Pcp box. PCP has 7-10 business days to fill out.

## 2021-04-25 ENCOUNTER — Telehealth (INDEPENDENT_AMBULATORY_CARE_PROVIDER_SITE_OTHER): Payer: Self-pay | Admitting: Nurse Practitioner

## 2021-04-25 ENCOUNTER — Telehealth: Payer: Self-pay

## 2021-04-25 NOTE — Telephone Encounter (Signed)
Called and left a vm to return call to office pt needs to scheduled a lab appt.

## 2021-04-25 NOTE — Telephone Encounter (Signed)
Copied from [place] [id number]. Topic: General - Call Back - No Documentation >> Apr 25, 2021 10:04 AM Scherrie Gerlach wrote: Reason for CRM: pt states he got call from office, but did not catch the phone call.  He left form to be filled out.

## 2021-04-26 ENCOUNTER — Other Ambulatory Visit: Payer: Self-pay

## 2021-04-26 ENCOUNTER — Other Ambulatory Visit: Payer: Self-pay | Admitting: Nurse Practitioner

## 2021-04-26 ENCOUNTER — Ambulatory Visit: Payer: 59 | Attending: Nurse Practitioner

## 2021-04-26 DIAGNOSIS — E785 Hyperlipidemia, unspecified: Secondary | ICD-10-CM

## 2021-04-27 LAB — LIPID PANEL
Chol/HDL Ratio: 4.2 ratio (ref 0.0–5.0)
Cholesterol, Total: 188 mg/dL (ref 100–199)
HDL: 45 mg/dL (ref >39)
LDL Chol Calc (NIH): 131 mg/dL — ABNORMAL HIGH (ref 0–99)
Triglycerides: 65 mg/dL (ref 0–149)
VLDL Cholesterol Cal: 12 mg/dL (ref 5–40)

## 2021-05-03 ENCOUNTER — Other Ambulatory Visit: Payer: Self-pay

## 2021-05-14 ENCOUNTER — Encounter (HOSPITAL_BASED_OUTPATIENT_CLINIC_OR_DEPARTMENT_OTHER): Payer: Self-pay

## 2021-06-03 ENCOUNTER — Other Ambulatory Visit: Payer: Self-pay

## 2021-06-21 ENCOUNTER — Ambulatory Visit (HOSPITAL_BASED_OUTPATIENT_CLINIC_OR_DEPARTMENT_OTHER): Payer: 59 | Admitting: Cardiology

## 2021-07-03 ENCOUNTER — Other Ambulatory Visit: Payer: Self-pay

## 2021-07-12 ENCOUNTER — Ambulatory Visit: Payer: 59 | Admitting: Nurse Practitioner

## 2021-07-19 ENCOUNTER — Ambulatory Visit (INDEPENDENT_AMBULATORY_CARE_PROVIDER_SITE_OTHER): Payer: 59 | Admitting: Family

## 2021-07-19 ENCOUNTER — Other Ambulatory Visit: Payer: Self-pay

## 2021-07-19 ENCOUNTER — Encounter (HOSPITAL_BASED_OUTPATIENT_CLINIC_OR_DEPARTMENT_OTHER): Payer: Self-pay | Admitting: Family

## 2021-07-19 VITALS — BP 130/90 | HR 74 | Ht 69.0 in | Wt 268.6 lb

## 2021-07-19 DIAGNOSIS — I700 Atherosclerosis of aorta: Secondary | ICD-10-CM | POA: Diagnosis not present

## 2021-07-19 DIAGNOSIS — E782 Mixed hyperlipidemia: Secondary | ICD-10-CM

## 2021-07-19 DIAGNOSIS — I10 Essential (primary) hypertension: Secondary | ICD-10-CM | POA: Diagnosis not present

## 2021-07-19 MED ORDER — ROSUVASTATIN CALCIUM 10 MG PO TABS
10.0000 mg | ORAL_TABLET | Freq: Every day | ORAL | 3 refills | Status: DC
  Filled 2021-07-19: qty 30, 30d supply, fill #0
  Filled 2021-08-15: qty 30, 30d supply, fill #1
  Filled 2021-09-16: qty 30, 30d supply, fill #2
  Filled 2021-10-23: qty 30, 30d supply, fill #3
  Filled 2021-12-03: qty 30, 30d supply, fill #4
  Filled 2022-01-02: qty 30, 30d supply, fill #5
  Filled 2022-02-07: qty 30, 30d supply, fill #6
  Filled 2022-03-13: qty 30, 30d supply, fill #7
  Filled 2022-04-10: qty 30, 30d supply, fill #8
  Filled 2022-05-04: qty 30, 30d supply, fill #9

## 2021-07-19 NOTE — Progress Notes (Signed)
? ?Office Visit  ?  ?Patient Name: Larry Thomas ?Date of Encounter: 07/19/2021 ? ?PCP:  Claiborne RiggFleming, Zelda W, NP ?  ?[facility]  ?Cardiologist:  Jodelle RedBridgette Christopher, MD  ?Advanced Practice Provider:  No care team member to display ?Electrophysiologist:  None  ?   ? ?Chief Complaint  ?  ?Larry Thomas is a 46 y.o. male with a hx of hypertension, hyperlipidemia, hypothyroidism, aortic atherosclerosis, lightheadedness presents today for hypertension follow up.  ? ?Past Medical History  ?  ?Past Medical History:  ?Diagnosis Date  ? Hypercholesterolemia   ? Hypertension   ? Hypothyroidism   ? Mixed hyperlipidemia 05/18/2017  ? Thyroid disease   ? ?Past Surgical History:  ?Procedure Laterality Date  ? BILATERAL CARPAL TUNNEL RELEASE Bilateral 04/12/2015  ? Procedure: BILATERAL CARPAL TUNNEL RELEASE;  Surgeon: Betha LoaKevin Kuzma, MD;  Location: [facility];  Service: Orthopedics;  Laterality: Bilateral;  ? ? ?Allergies ? ?No Known Allergies ? ?History of Present Illness  ?  ?Larry Thomas is a 46 y.o. male with a hx of hypertension, hyperlipidemia, hypothyroidism, aortic atherosclerosis, lightheadedness last seen 06/12/2019. ? ?He was seen in consult 06/20/2019 by Dr. Cristal Deerhristopher to establish care with cardiology at the request of his PCP.  Blood pressure was in the 130s over 80s at home.  Most recent lipid clinic at that time with LDL 106, HDL 43, triglycerides 65.  Prior CT angio 05/2019 with no mention of coronary calcification though does note mild aortic atherosclerosis.  As he was low risk with atypical symptoms there was no recommendation for ischemic evaluation.  Orthostatic vitals were negative as was EKG.  His dizziness was overall improving and thought to be more related to vertigo.  He was recommended to consider a statin but preferred to make dietary changes first. ? ?He presents today for follow-up.  Most recent lipid panel 04/26/2021 with total cholesterol 188,  triglycerides 65, HDL 45, LDL 131.  We reviewed LDL goal less than 100 in the setting of aortic atherosclerosis.  He has never been on cholesterol medication.  He endorses staying active working for the [place].  He does have a gym at his job site that he can use for free.  He has been trying to improve his diet by eating less meat and more vegetables.  Blood pressure at home usually 120s over 80s.  He notes no dizziness, palpitations, exertional dyspnea.  He has a very rare chest discomfort which predominantly occurs at rest and is overall not bothersome.  It is much less frequent and intense than previous. ? ? ?The 10-year ASCVD risk score (Arnett DK, et al., 2019) is: 7.5% ?  Values used to calculate the score: ?    Age: 75 years ?    Sex: Male ?    Is Non-Hispanic African American: Yes ?    Diabetic: No ?    Tobacco smoker: No ?    Systolic Blood Pressure: 130 mmHg ?    Is BP treated: Yes ?    HDL Cholesterol: 45 mg/dL ?    Total Cholesterol: 188 mg/dL ? ?EKGs/Labs/Other Studies Reviewed:  ? ?The following studies were reviewed today: ? ?CT angio chest/abd/pel 06/03/19 ?  ?IMPRESSION: ?1. No acute abnormality. No evidence of thoracic aortic aneurysm or ?dissection. No evidence of pulmonary emboli. ?2. Punctate nonobstructing LEFT renal calculus. ?3.  Aortic Atherosclerosis (ICD10-I70.0). ? ?Echo 05/2018 ? 1. The left ventricle has normal systolic function, with an ejection  ?  fraction of 55-60%. The cavity size was normal. There is mildly increased  ?left ventricular wall thickness. Left ventricular diastolic Doppler  ?parameters are consistent with impaired  ?relaxation Indeterminent filling pressures The E/e' is 8-15.  ? 2. The right ventricle has normal systolic function. The cavity was  ?normal. There is no increase in right ventricular wall thickness.  ? 3. Left atrial size was mildly dilated.  ? 4. The mitral valve is normal in structure. Mild thickening of the mitral  ?valve leaflet.  ? 5. The  tricuspid valve is normal in structure.  ? 6. The aortic valve is tricuspid Mild sclerosis of the aortic valve.  ? 7. The ascending aorta and aortic root are normal in size and structure.  ? ?EKG:  EKG is  ordered today.  The ekg ordered today demonstrates NSR 74 bpm with occasional PVC.  No acute ST/T wave changes. ? ?Recent Labs: ?04/12/2021: ALT 24; BUN 10; Creatinine, Ser 1.00; Potassium 4.4; Sodium 141; TSH 2.100  ?Recent Lipid Panel ?   ?Component Value Date/Time  ? CHOL 188 04/26/2021 1345  ? TRIG 65 04/26/2021 1345  ? HDL 45 04/26/2021 1345  ? CHOLHDL 4.2 04/26/2021 1345  ? LDLCALC 131 (H) 04/26/2021 1345  ? ? ? ?Home Medications  ? ?Current Meds  ?Medication Sig  ? amLODipine (NORVASC) 5 MG tablet Take 1 tablet (5 mg total) by mouth daily.  ? aspirin 81 MG chewable tablet Chew 81 mg by mouth daily as needed for mild pain.  ? Blood Pressure Monitor DEVI Please provide patient with insurance approved blood pressure monitor ICD 10 I10.0  ? Disposable Gloves (POWDER FREE NITRILE GLOVES XL) MISC 1 each by Does not apply route daily as needed. Please provide patient with insurance approved gloves due to COVID 19. Z91.89  ? ibuprofen (ADVIL) 200 MG tablet Take 800 mg by mouth every 6 (six) hours as needed for moderate pain.  ? levothyroxine (SYNTHROID) 175 MCG tablet Take 1 tablet (175 mcg total) by mouth daily before breakfast.  ? Masks MISC 1 each by Does not apply route daily as needed. Please provide patient with insurance approved masks due to COVID 19. Z91.89  ? Multiple Vitamin (MULTIVITAMIN WITH MINERALS) TABS tablet Take 1 tablet by mouth daily.  ? rosuvastatin (CRESTOR) 10 MG tablet Take 1 tablet (10 mg total) by mouth daily.  ?  ? ?Review of Systems  ?    ?All other systems reviewed and are otherwise negative except as noted above. ? ?Physical Exam  ?  ?VS:  BP 130/90   Pulse 74   Ht 5\' 9"  (1.753 m)   Wt 268 lb 9.6 oz (121.8 kg)   SpO2 96%   BMI 39.67 kg/m?  , BMI Body mass index is 39.67  kg/m?. ? ?Wt Readings from Last 3 Encounters:  ?07/19/21 268 lb 9.6 oz (121.8 kg)  ?04/12/21 261 lb (118.4 kg)  ?10/12/20 259 lb (117.5 kg)  ?  ?GEN: Well nourished, well developed, in no acute distress. ?HEENT: normal. ?Neck: Supple, no JVD, carotid bruits, or masses. ?Cardiac: RRR, no murmurs, rubs, or gallops. No clubbing, cyanosis, edema.  Radials/PT 2+ and equal bilaterally.  ?Respiratory:  Respirations regular and unlabored, clear to auscultation bilaterally. ?GI: Soft, nontender, nondistended. ?MS: No deformity or atrophy. ?Skin: Warm and dry, no rash. ?Neuro:  Strength and sensation are intact. ?Psych: Normal affect. ? ?Assessment & Plan  ?  ?Aortic atherosclerosis- No anginal symptoms. CT 05/2019 with aortic atherosclerosis and no noted  coronary atherosclerosis. 10 year ASCVD risk score 7.5%. Continue Aspirin. Start Rosuvastatin 10mg  daily. Heart healthy diet and regular cardiovascular exercise encouraged.   ? ?The 10-year ASCVD risk score (Arnett DK, et al., 2019) is: 7.5% ?  Values used to calculate the score: ?    Age: 47 years ?    Sex: Male ?    Is Non-Hispanic African American: Yes ?    Diabetic: No ?    Tobacco smoker: No ?    Systolic Blood Pressure: 130 mmHg ?    Is BP treated: Yes ?    HDL Cholesterol: 45 mg/dL ?    Total Cholesterol: 188 mg/dL  ? ?Hypertension - BP well controlled. Continue current antihypertensive regimen Amlodipine 5mg  daily.   ? ?Hyperlipidemia - LDL goal <100 due to aortic atherosclerosis. Start Crestor 10mg  QD. FLP/CMP in 28months. If LDL not at goal plan to increase dose.  Dietary handout provided in clinic today. ? ?Hypothyroidism - Continue to follow with PCP.  ? ?Disposition: Follow up in 1 year(s) with , MD or APP. ? ?Signed, ? , NP ?07/19/2021, 11:38 AM ?[facility] ?

## 2021-07-19 NOTE — Patient Instructions (Signed)
Medication Instructions:  ?Your physician has recommended you make the following change in your medication:  ? ?Start: Rosuvastatin 10mg  daily  ? ? ?*If you need a refill on your cardiac medications before your next appointment, please call your pharmacy* ? ? ?Lab Work: ?Please return for Lab work in 2 months for Fasting Lipid Panel and CMET. You may come to the...  ? ?[facility] (3rd floor) ?[address] [place], [place]  ?Open: 8am-Noon and 1pm-4:30pm  ? ?[facility] ?[address]  ? ?[facility]- Any location ? ?**no appointments needed** ? ?If you have labs (blood work) drawn today and your tests are completely normal, you will receive your results only by: ?MyChart Message (if you have MyChart) OR ?A paper copy in the mail ?If you have any lab test that is abnormal or we need to change your treatment, we will call you to review the results. ? ? ?Testing/Procedures: ?None ordered today  ? ? ?Follow-Up: ?At [facility], you and your health needs are our priority.  As part of our continuing mission to provide you with exceptional heart care, we have created designated Provider Care Teams.  These Care Teams include your primary Cardiologist (physician) and Advanced Practice Providers (APPs -  Physician Assistants and Nurse Practitioners) who all work together to provide you with the care you need, when you need it. ? ?We recommend signing up for the patient portal called "MyChart".  Sign up information is provided on this After Visit Summary.  MyChart is used to connect with patients for Virtual Visits (Telemedicine).  Patients are able to view lab/test results, encounter notes, upcoming appointments, etc.  Non-urgent messages can be sent to your provider as well.   ?To learn more about what you can do with MyChart, go to [facility].   ? ?Your next appointment:   ?1 year(s) ? ?The format for your next appointment:   ?In  Person ? ?Provider:   ?[email or web address], MD{ ? ?Other Instructions ?Heart Healthy Diet Recommendations: ?A low-salt diet is recommended. Meats should be grilled, baked, or boiled. Avoid fried foods. Focus on lean protein sources like fish or chicken with vegetables and fruits. The American Heart Association is a Jodelle Red!  American Heart Association Diet and Lifeystyle Recommendations  ? ?Exercise recommendations: ?The American Heart Association recommends 150 minutes of moderate intensity exercise weekly. ?Try 30 minutes of moderate intensity exercise 4-5 times per week. ?This could include walking, jogging, or swimming. ? ?High Cholesterol ? ?High cholesterol is a condition in which the blood has high levels of a white, waxy substance similar to fat (cholesterol). The liver makes all the cholesterol that the body needs. The human body needs small amounts of cholesterol to help build cells. A person gets extra or excess cholesterol from the food that he or she eats. ?The blood carries cholesterol from the liver to the rest of the body. If you have high cholesterol, deposits (plaques) may build up on the walls of your arteries. Arteries are the blood vessels that carry blood away from your heart. These plaques make the arteries narrow and stiff. ?Cholesterol plaques increase your risk for heart attack and stroke. Work with your health care provider to keep your cholesterol levels in a healthy range. ?What increases the risk? ?The following factors may make you more likely to develop this condition: ?Eating foods that are high in animal fat (saturated fat) or cholesterol. ?Being overweight. ?Not getting enough exercise. ?A  family history of high cholesterol (familial hypercholesterolemia). ?Use of tobacco products. ?Having diabetes. ?What are the signs or symptoms? ?In most cases, high cholesterol does not usually cause any symptoms. ?In severe cases, very high cholesterol levels can cause: ?Fatty bumps  under the skin (xanthomas). ?A white or gray ring around the black center (pupil) of the eye. ?How is this diagnosed? ?This condition may be diagnosed based on the results of a blood test. ?If you are older than 47 years of age, your health care provider may check your cholesterol levels every 4-6 years. ?You may be checked more often if you have high cholesterol or other risk factors for heart disease. ?The blood test for cholesterol measures: ?"Bad" cholesterol, or LDL cholesterol. This is the main type of cholesterol that causes heart disease. The desired level is less than 100 mg/dL (1.612.59 mmol/L). ?"Good" cholesterol, or HDL cholesterol. HDL helps protect against heart disease by cleaning the arteries and carrying the LDL to the liver for processing. The desired level for HDL is 60 mg/dL (0.961.55 mmol/L) or higher. ?Triglycerides. These are fats that your body can store or burn for energy. The desired level is less than 150 mg/dL (0.451.69 mmol/L). ?Total cholesterol. This measures the total amount of cholesterol in your blood and includes LDL, HDL, and triglycerides. The desired level is less than 200 mg/dL (4.095.17 mmol/L). ?How is this treated? ?Treatment for high cholesterol starts with lifestyle changes, such as diet and exercise. ?Diet changes. You may be asked to eat foods that have more fiber and less saturated fats or added sugar. ?Lifestyle changes. These may include regular exercise, maintaining a healthy weight, and quitting use of tobacco products. ?Medicines. These are given when diet and lifestyle changes have not worked. You may be prescribed a statin medicine to help lower your cholesterol levels. ?Follow these instructions at home: ?Eating and drinking ? ?Eat a healthy, balanced diet. This diet includes: ?Daily servings of a variety of fresh, frozen, or canned fruits and vegetables. ?Daily servings of whole grain foods that are rich in fiber. ?Foods that are low in saturated fats and trans fats. These  include poultry and fish without skin, lean cuts of meat, and low-fat dairy products. ?A variety of fish, especially oily fish that contain omega-3 fatty acids. Aim to eat fish at least 2 times a week. ?Avoid foods and drinks that have added sugar. ?Use healthy cooking methods, such as roasting, grilling, broiling, baking, poaching, steaming, and stir-frying. Do not fry your food except for stir-frying. ?If you drink alcohol: ?Limit how much you have to: ?0-1 drink a day for women who are not pregnant. ?0-2 drinks a day for men. ?Know how much alcohol is in a drink. In the [place] one drink equals one 12 oz bottle of beer (355 mL), one 5 oz glass of wine (148 mL), or one 1? oz glass of hard liquor (44 mL). ?Lifestyle ? ?Get regular exercise. Aim to exercise for a total of 150 minutes a week. Increase your activity level by doing activities such as gardening, walking, and taking the stairs. ?Do not use any products that contain nicotine or tobacco. These products include cigarettes, chewing tobacco, and vaping devices, such as e-cigarettes. If you need help quitting, ask your health care provider. ?General instructions ?Take over-the-counter and prescription medicines only as told by your health care provider. ?Keep all follow-up visits. This is important. ?Where to find more information ?American Heart Association: www.heart.org ?National Heart, Lung, and Blood Institute: PopSteam.iswww.nhlbi.nih.gov ?Contact  a health care provider if: ?You have trouble achieving or maintaining a healthy diet or weight. ?You are starting an exercise program. ?You are unable to stop smoking. ?Get help right away if: ?You have chest pain. ?You have trouble breathing. ?You have discomfort or pain in your jaw, neck, back, shoulder, or arm. ?You have any symptoms of a stroke. "BE FAST" is an easy way to remember the main warning signs of a stroke: ?B - Balance. Signs are dizziness, sudden trouble walking, or loss of balance. ?E - Eyes. Signs are  trouble seeing or a sudden change in vision. ?F - Face. Signs are sudden weakness or numbness of the face, or the face or eyelid drooping on one side. ?A - Arms. Signs are weakness or numbness in an arm. This happens s

## 2021-08-01 ENCOUNTER — Other Ambulatory Visit: Payer: Self-pay

## 2021-08-15 ENCOUNTER — Other Ambulatory Visit: Payer: Self-pay

## 2021-08-16 ENCOUNTER — Encounter: Payer: Self-pay | Admitting: Nurse Practitioner

## 2021-08-16 ENCOUNTER — Other Ambulatory Visit: Payer: Self-pay

## 2021-08-16 ENCOUNTER — Ambulatory Visit: Payer: 59 | Attending: Nurse Practitioner | Admitting: Nurse Practitioner

## 2021-08-16 VITALS — BP 130/92 | HR 52 | Wt 263.8 lb

## 2021-08-16 DIAGNOSIS — Z1211 Encounter for screening for malignant neoplasm of colon: Secondary | ICD-10-CM

## 2021-08-16 DIAGNOSIS — I10 Essential (primary) hypertension: Secondary | ICD-10-CM | POA: Diagnosis not present

## 2021-08-16 DIAGNOSIS — R5383 Other fatigue: Secondary | ICD-10-CM

## 2021-08-16 DIAGNOSIS — E039 Hypothyroidism, unspecified: Secondary | ICD-10-CM

## 2021-08-16 MED ORDER — LEVOTHYROXINE SODIUM 175 MCG PO TABS
175.0000 ug | ORAL_TABLET | Freq: Every day | ORAL | 1 refills | Status: DC
  Filled 2021-08-16: qty 90, 90d supply, fill #0
  Filled 2021-08-29: qty 30, 30d supply, fill #0
  Filled 2021-09-30: qty 30, 30d supply, fill #1
  Filled 2021-10-23: qty 30, 30d supply, fill #2
  Filled 2021-12-03: qty 30, 30d supply, fill #3
  Filled 2022-01-02: qty 30, 30d supply, fill #4
  Filled 2022-02-07: qty 30, 30d supply, fill #5

## 2021-08-16 NOTE — Progress Notes (Signed)
Assessment & Plan:  Larry Thomas was seen today for hypertension.  Diagnoses and all orders for this visit:  Essential hypertension -     CMP14+EGFR  Acquired hypothyroidism -     levothyroxine (SYNTHROID) 175 MCG tablet; Take 1 tablet (175 mcg total) by mouth daily before breakfast. -     Thyroid Panel With TSH -     CMP14+EGFR -     CBC  Colon cancer screening -     Fecal occult blood, imunochemical([facility])  Fatigue, unspecified type -     CBC    Patient has been counseled on age-appropriate routine health concerns for screening and prevention. These are reviewed and up-to-date. Referrals have been placed accordingly. Immunizations are up-to-date or declined.    Subjective:   Chief Complaint  Patient presents with   Hypertension   Hypertension Pertinent negatives include no blurred vision, chest pain, headaches, malaise/fatigue, palpitations or shortness of breath.  Larry Thomas 46 y.o. male presents to office today for follow-up to hypertension.  He was started on rosuvastatin at his most recent cardiology visit last month. The 10-year ASCVD risk score (Arnett DK, et al., 2019) is: 7.5%   Values used to calculate the score:     Age: 18 years     Sex: Male     Is Non-Hispanic African American: Yes     Diabetic: No     Tobacco smoker: No     Systolic Blood Pressure: 016 mmHg     Is BP treated: Yes     HDL Cholesterol: 45 mg/dL     Total Cholesterol: 188 mg/dL    HTN Blood pressure slightly elevated today.  He is working on weight loss and dietary changes.  Weight is down 5 pounds from 4 weeks ago.  We will follow we will continue amlodipine 5 mg daily for now and if blood pressure continues elevated will need to increase amlodipine to 10 mg daily.   BP Readings from Last 3 Encounters:  08/16/21 (!) 130/92  07/19/21 130/90  04/12/21 116/78    Review of Systems  Constitutional:  Negative for fever, malaise/fatigue and weight loss.  HENT: Negative.   Negative for nosebleeds.   Eyes: Negative.  Negative for blurred vision, double vision and photophobia.  Respiratory: Negative.  Negative for cough and shortness of breath.   Cardiovascular: Negative.  Negative for chest pain, palpitations and leg swelling.  Gastrointestinal: Negative.  Negative for heartburn, nausea and vomiting.  Musculoskeletal: Negative.  Negative for myalgias.  Neurological: Negative.  Negative for dizziness, focal weakness, seizures and headaches.  Psychiatric/Behavioral: Negative.  Negative for suicidal ideas.    Past Medical History:  Diagnosis Date   Hypercholesterolemia    Hypertension    Hypothyroidism    Mixed hyperlipidemia 05/18/2017   Thyroid disease     Past Surgical History:  Procedure Laterality Date   BILATERAL CARPAL TUNNEL RELEASE Bilateral 04/12/2015   Procedure: BILATERAL CARPAL TUNNEL RELEASE;  Surgeon: Leanora Cover, MD;  Location: [facility];  Service: Orthopedics;  Laterality: Bilateral;    Family History  Problem Relation Age of Onset   Thyroid cancer Mother     Social History Reviewed with no changes to be made today.   Outpatient Medications Prior to Visit  Medication Sig Dispense Refill   amLODipine (NORVASC) 5 MG tablet Take 1 tablet (5 mg total) by mouth daily. 90 tablet 1   aspirin 81 MG chewable tablet Chew 81 mg by mouth daily as needed for  mild pain.     Blood Pressure Monitor DEVI Please provide patient with insurance approved blood pressure monitor ICD 10 I10.0 1 each 0   Disposable Gloves (POWDER FREE NITRILE GLOVES XL) MISC 1 each by Does not apply route daily as needed. Please provide patient with insurance approved gloves due to COVID 19. Z91.89 100 each prn   ibuprofen (ADVIL) 200 MG tablet Take 800 mg by mouth every 6 (six) hours as needed for moderate pain.     Masks MISC 1 each by Does not apply route daily as needed. Please provide patient with insurance approved masks due to COVID 19. Z91.89 100 each  prn   Multiple Vitamin (MULTIVITAMIN WITH MINERALS) TABS tablet Take 1 tablet by mouth daily.     rosuvastatin (CRESTOR) 10 MG tablet Take 1 tablet (10 mg total) by mouth daily. 90 tablet 3   levothyroxine (SYNTHROID) 175 MCG tablet Take 1 tablet (175 mcg total) by mouth daily before breakfast. 90 tablet 1   No facility-administered medications prior to visit.    No Known Allergies     Objective:    BP (!) 130/92   Pulse (!) 52   Wt 263 lb 12.8 oz (119.7 kg)   SpO2 99%   BMI 38.96 kg/m  Wt Readings from Last 3 Encounters:  08/16/21 263 lb 12.8 oz (119.7 kg)  07/19/21 268 lb 9.6 oz (121.8 kg)  04/12/21 261 lb (118.4 kg)    Physical Exam Vitals and nursing note reviewed.  Constitutional:      Appearance: He is well-developed.  HENT:     Head: Normocephalic and atraumatic.  Cardiovascular:     Rate and Rhythm: Regular rhythm. Bradycardia present.     Heart sounds: Normal heart sounds. No murmur heard.   No friction rub. No gallop.  Pulmonary:     Effort: Pulmonary effort is normal. No tachypnea or respiratory distress.     Breath sounds: Normal breath sounds. No decreased breath sounds, wheezing, rhonchi or rales.  Chest:     Chest wall: No tenderness.  Abdominal:     General: Bowel sounds are normal.     Palpations: Abdomen is soft.  Musculoskeletal:        General: Normal range of motion.     Cervical back: Normal range of motion.  Skin:    General: Skin is warm and dry.  Neurological:     Mental Status: He is alert and oriented to person, place, and time.     Coordination: Coordination normal.  Psychiatric:        Behavior: Behavior normal. Behavior is cooperative.        Thought Content: Thought content normal.        Judgment: Judgment normal.         Patient has been counseled extensively about nutrition and exercise as well as the importance of adherence with medications and regular follow-up. The patient was given clear instructions to go to ER or  return to medical center if symptoms don't improve, worsen or new problems develop. The patient verbalized understanding.   Follow-up: No follow-ups on file.   [name], FNP-BC [facility] [place], [place]   08/16/2021, 1:45 PM

## 2021-08-17 LAB — THYROID PANEL WITH TSH
Free Thyroxine Index: 2.3 (ref 1.2–4.9)
T3 Uptake Ratio: 24 % (ref 24–39)
T4, Total: 9.6 ug/dL (ref 4.5–12.0)
TSH: 4.58 u[IU]/mL — ABNORMAL HIGH (ref 0.450–4.500)

## 2021-08-17 LAB — CBC
Hematocrit: 42.2 % (ref 37.5–51.0)
Hemoglobin: 14.5 g/dL (ref 13.0–17.7)
MCH: 28.5 pg (ref 26.6–33.0)
MCHC: 34.4 g/dL (ref 31.5–35.7)
MCV: 83 fL (ref 79–97)
Platelets: 247 10*3/uL (ref 150–450)
RBC: 5.08 x10E6/uL (ref 4.14–5.80)
RDW: 12.6 % (ref 11.6–15.4)
WBC: 4.5 10*3/uL (ref 3.4–10.8)

## 2021-08-17 LAB — CMP14+EGFR
ALT: 22 IU/L (ref 0–44)
AST: 27 IU/L (ref 0–40)
Albumin/Globulin Ratio: 1.5 (ref 1.2–2.2)
Albumin: 4.7 g/dL (ref 4.0–5.0)
Alkaline Phosphatase: 63 IU/L (ref 44–121)
BUN/Creatinine Ratio: 10 (ref 9–20)
BUN: 9 mg/dL (ref 6–24)
Bilirubin Total: 0.6 mg/dL (ref 0.0–1.2)
CO2: 25 mmol/L (ref 20–29)
Calcium: 9.7 mg/dL (ref 8.7–10.2)
Chloride: 103 mmol/L (ref 96–106)
Creatinine, Ser: 0.86 mg/dL (ref 0.76–1.27)
Globulin, Total: 3.1 g/dL (ref 1.5–4.5)
Glucose: 74 mg/dL (ref 70–99)
Potassium: 4.2 mmol/L (ref 3.5–5.2)
Sodium: 139 mmol/L (ref 134–144)
Total Protein: 7.8 g/dL (ref 6.0–8.5)
eGFR: 108 mL/min/{1.73_m2} (ref >59)

## 2021-08-29 LAB — FECAL OCCULT BLOOD, IMMUNOCHEMICAL: Fecal Occult Bld: NEGATIVE

## 2021-08-30 ENCOUNTER — Other Ambulatory Visit: Payer: Self-pay

## 2021-09-16 ENCOUNTER — Other Ambulatory Visit: Payer: Self-pay

## 2021-09-16 ENCOUNTER — Encounter (HOSPITAL_BASED_OUTPATIENT_CLINIC_OR_DEPARTMENT_OTHER): Payer: Self-pay

## 2021-09-18 LAB — LIPID PANEL
Chol/HDL Ratio: 3.9 ratio (ref 0.0–5.0)
Cholesterol, Total: 141 mg/dL (ref 100–199)
HDL: 36 mg/dL — ABNORMAL LOW (ref >39)
LDL Chol Calc (NIH): 89 mg/dL (ref 0–99)
Triglycerides: 82 mg/dL (ref 0–149)
VLDL Cholesterol Cal: 16 mg/dL (ref 5–40)

## 2021-09-18 LAB — COMPREHENSIVE METABOLIC PANEL
ALT: 24 IU/L (ref 0–44)
AST: 29 IU/L (ref 0–40)
Albumin/Globulin Ratio: 1.5 (ref 1.2–2.2)
Albumin: 4.4 g/dL (ref 4.0–5.0)
Alkaline Phosphatase: 65 IU/L (ref 44–121)
BUN/Creatinine Ratio: 6 — ABNORMAL LOW (ref 9–20)
BUN: 7 mg/dL (ref 6–24)
Bilirubin Total: 0.4 mg/dL (ref 0.0–1.2)
CO2: 19 mmol/L — ABNORMAL LOW (ref 20–29)
Calcium: 9 mg/dL (ref 8.7–10.2)
Chloride: 105 mmol/L (ref 96–106)
Creatinine, Ser: 1.16 mg/dL (ref 0.76–1.27)
Globulin, Total: 3 g/dL (ref 1.5–4.5)
Glucose: 82 mg/dL (ref 70–99)
Potassium: 4.6 mmol/L (ref 3.5–5.2)
Sodium: 140 mmol/L (ref 134–144)
Total Protein: 7.4 g/dL (ref 6.0–8.5)
eGFR: 79 mL/min/{1.73_m2} (ref >59)

## 2021-09-30 ENCOUNTER — Other Ambulatory Visit: Payer: Self-pay

## 2021-10-23 ENCOUNTER — Other Ambulatory Visit: Payer: Self-pay | Admitting: Nurse Practitioner

## 2021-10-23 DIAGNOSIS — I10 Essential (primary) hypertension: Secondary | ICD-10-CM

## 2021-10-24 ENCOUNTER — Other Ambulatory Visit: Payer: Self-pay

## 2021-10-24 MED ORDER — AMLODIPINE BESYLATE 5 MG PO TABS
5.0000 mg | ORAL_TABLET | Freq: Every day | ORAL | 0 refills | Status: DC
  Filled 2021-10-24: qty 30, 30d supply, fill #0
  Filled 2021-12-03: qty 30, 30d supply, fill #1
  Filled 2022-01-02: qty 30, 30d supply, fill #2

## 2021-10-24 NOTE — Telephone Encounter (Signed)
Requested Prescriptions  Pending Prescriptions Disp Refills  . amLODipine (NORVASC) 5 MG tablet 90 tablet 1    Sig: Take 1 tablet (5 mg total) by mouth daily.     Cardiovascular: Calcium Channel Blockers 2 Failed - 10/23/2021  6:35 PM      Failed - Last BP in normal range    BP Readings from Last 1 Encounters:  08/16/21 (!) 130/92         Passed - Last Heart Rate in normal range    Pulse Readings from Last 1 Encounters:  08/16/21 (!) 52         Passed - Valid encounter within last 6 months    Recent Outpatient Visits          2 months ago Essential hypertension   [facility] [place], Shea Stakes, NP   6 months ago Acquired hypothyroidism   [facility] [place], Shea Stakes, NP   1 year ago Acquired hypothyroidism   [facility] Bing Neighbors, FNP   1 year ago Essential hypertension   [facility] Claiborne Rigg, NP   1 year ago Essential hypertension    [facility] [place], Shea Stakes, NP      Future Appointments            In 3 weeks Claiborne Rigg, NP [facility]

## 2021-10-25 ENCOUNTER — Other Ambulatory Visit: Payer: Self-pay

## 2021-11-20 ENCOUNTER — Ambulatory Visit: Payer: 59 | Attending: Nurse Practitioner | Admitting: Nurse Practitioner

## 2021-11-20 ENCOUNTER — Encounter: Payer: Self-pay | Admitting: Nurse Practitioner

## 2021-11-20 VITALS — BP 129/87 | HR 76 | Temp 98.1°F | Wt 265.0 lb

## 2021-11-20 DIAGNOSIS — E039 Hypothyroidism, unspecified: Secondary | ICD-10-CM

## 2021-11-20 DIAGNOSIS — Z1159 Encounter for screening for other viral diseases: Secondary | ICD-10-CM

## 2021-11-20 DIAGNOSIS — I10 Essential (primary) hypertension: Secondary | ICD-10-CM

## 2021-11-20 NOTE — Progress Notes (Signed)
Assessment & Plan:  Larry Thomas was seen today for hypertension.  Diagnoses and all orders for this visit:  Essential hypertension Continue amlodipine as prescribed.  Reminded to bring in blood pressure log for follow  up appointment.  RECOMMENDATIONS: DASH/Mediterranean Diets are healthier choices for HTN.    Acquired hypothyroidism -     Thyroid Panel With TSH  Need for hepatitis C screening test -     HCV Ab w Reflex to Quant PCR =    Patient has been counseled on age-appropriate routine health concerns for screening and prevention. These are reviewed and up-to-date. Referrals have been placed accordingly. Immunizations are up-to-date or declined.    Subjective:   Chief Complaint  Patient presents with   Hypertension   Hypertension Pertinent negatives include no blurred vision, chest pain, headaches, malaise/fatigue, palpitations or shortness of breath.   Larry Thomas 46 y.o. male presents to office today for follow up to HTN  He has a past medical history of Hypercholesterolemia, Hypertension, Hypothyroidism, Mixed hyperlipidemia (05/18/2017), and Thyroid disease.   HTN Blood pressure is well controlled today. He is taking amlodipine 5 mg daily as prescribed.   BP Readings from Last 3 Encounters:  11/20/21 129/87  08/16/21 (!) 130/92  07/19/21 130/90    Hypothyroidism Taking synthroid 175 mg daily as prescribed. Denies any symptoms of hypo or hyperthyroidism Lab Results  Component Value Date   TSH 4.580 (H) 08/16/2021    Review of Systems  Constitutional:  Negative for fever, malaise/fatigue and weight loss.  HENT: Negative.  Negative for nosebleeds.   Eyes: Negative.  Negative for blurred vision, double vision and photophobia.  Respiratory: Negative.  Negative for cough and shortness of breath.   Cardiovascular: Negative.  Negative for chest pain, palpitations and leg swelling.  Gastrointestinal: Negative.  Negative for heartburn, nausea and vomiting.   Musculoskeletal: Negative.  Negative for myalgias.  Neurological: Negative.  Negative for dizziness, focal weakness, seizures and headaches.  Psychiatric/Behavioral: Negative.  Negative for suicidal ideas.     Past Medical History:  Diagnosis Date   Hypercholesterolemia    Hypertension    Hypothyroidism    Mixed hyperlipidemia 05/18/2017   Thyroid disease     Past Surgical History:  Procedure Laterality Date   BILATERAL CARPAL TUNNEL RELEASE Bilateral 04/12/2015   Procedure: BILATERAL CARPAL TUNNEL RELEASE;  Surgeon: Betha Loa, MD;  Location: [facility];  Service: Orthopedics;  Laterality: Bilateral;    Family History  Problem Relation Age of Onset   Thyroid cancer Mother     Social History Reviewed with no changes to be made today.   Outpatient Medications Prior to Visit  Medication Sig Dispense Refill   amLODipine (NORVASC) 5 MG tablet Take 1 tablet (5 mg total) by mouth daily. 90 tablet 0   aspirin 81 MG chewable tablet Chew 81 mg by mouth daily as needed for mild pain.     Blood Pressure Monitor DEVI Please provide patient with insurance approved blood pressure monitor ICD 10 I10.0 1 each 0   Disposable Gloves (POWDER FREE NITRILE GLOVES XL) MISC 1 each by Does not apply route daily as needed. Please provide patient with insurance approved gloves due to COVID 19. Z91.89 100 each prn   ibuprofen (ADVIL) 200 MG tablet Take 800 mg by mouth every 6 (six) hours as needed for moderate pain.     levothyroxine (SYNTHROID) 175 MCG tablet Take 1 tablet (175 mcg total) by mouth daily before breakfast. 90 tablet 1  Masks MISC 1 each by Does not apply route daily as needed. Please provide patient with insurance approved masks due to COVID 19. Z91.89 100 each prn   Multiple Vitamin (MULTIVITAMIN WITH MINERALS) TABS tablet Take 1 tablet by mouth daily.     rosuvastatin (CRESTOR) 10 MG tablet Take 1 tablet (10 mg total) by mouth daily. 90 tablet 3   No  facility-administered medications prior to visit.    No Known Allergies     Objective:    BP 129/87   Pulse 76   Temp 98.1 F (36.7 C) (Oral)   Wt 265 lb (120.2 kg)   SpO2 98%   BMI 39.13 kg/m  Wt Readings from Last 3 Encounters:  11/20/21 265 lb (120.2 kg)  08/16/21 263 lb 12.8 oz (119.7 kg)  07/19/21 268 lb 9.6 oz (121.8 kg)    Physical Exam Vitals and nursing note reviewed.  Constitutional:      Appearance: He is well-developed.  HENT:     Head: Normocephalic and atraumatic.  Cardiovascular:     Rate and Rhythm: Normal rate and regular rhythm.     Heart sounds: Normal heart sounds. No murmur heard.    No friction rub. No gallop.  Pulmonary:     Effort: Pulmonary effort is normal. No tachypnea or respiratory distress.     Breath sounds: Normal breath sounds. No decreased breath sounds, wheezing, rhonchi or rales.  Chest:     Chest wall: No tenderness.  Abdominal:     General: Bowel sounds are normal.     Palpations: Abdomen is soft.  Musculoskeletal:        General: Normal range of motion.     Cervical back: Normal range of motion.  Skin:    General: Skin is warm and dry.  Neurological:     Mental Status: He is alert and oriented to person, place, and time.     Coordination: Coordination normal.  Psychiatric:        Behavior: Behavior normal. Behavior is cooperative.        Thought Content: Thought content normal.        Judgment: Judgment normal.          Patient has been counseled extensively about nutrition and exercise as well as the importance of adherence with medications and regular follow-up. The patient was given clear instructions to go to ER or return to medical center if symptoms don't improve, worsen or new problems develop. The patient verbalized understanding.   Follow-up: Return in about 3 months (around 02/20/2022) for HTN.   Claiborne Rigg, FNP-BC [facility] and [facility] [place], [place] [phone]    11/20/2021, 3:53 PM

## 2021-11-21 LAB — HCV INTERPRETATION

## 2021-11-21 LAB — THYROID PANEL WITH TSH
Free Thyroxine Index: 1.8 (ref 1.2–4.9)
T3 Uptake Ratio: 21 % — ABNORMAL LOW (ref 24–39)
T4, Total: 8.4 ug/dL (ref 4.5–12.0)
TSH: 7.86 u[IU]/mL — ABNORMAL HIGH (ref 0.450–4.500)

## 2021-11-21 LAB — HCV AB W REFLEX TO QUANT PCR: HCV Ab: NONREACTIVE

## 2021-11-24 ENCOUNTER — Encounter: Payer: Self-pay | Admitting: Nurse Practitioner

## 2021-12-03 ENCOUNTER — Other Ambulatory Visit: Payer: Self-pay

## 2022-01-03 ENCOUNTER — Other Ambulatory Visit: Payer: Self-pay

## 2022-01-26 ENCOUNTER — Other Ambulatory Visit: Payer: Self-pay | Admitting: Nurse Practitioner

## 2022-01-26 DIAGNOSIS — I10 Essential (primary) hypertension: Secondary | ICD-10-CM

## 2022-01-27 ENCOUNTER — Other Ambulatory Visit: Payer: Self-pay

## 2022-01-27 MED ORDER — AMLODIPINE BESYLATE 5 MG PO TABS
5.0000 mg | ORAL_TABLET | Freq: Every day | ORAL | 0 refills | Status: DC
  Filled 2022-02-07: qty 30, 30d supply, fill #0

## 2022-01-27 NOTE — Telephone Encounter (Signed)
Requested Prescriptions  Pending Prescriptions Disp Refills   amLODipine (NORVASC) 5 MG tablet 90 tablet 0    Sig: Take 1 tablet (5 mg total) by mouth daily.     Cardiovascular: Calcium Channel Blockers 2 Passed - 01/26/2022  1:53 PM      Passed - Last BP in normal range    BP Readings from Last 1 Encounters:  11/20/21 129/87         Passed - Last Heart Rate in normal range    Pulse Readings from Last 1 Encounters:  11/20/21 76         Passed - Valid encounter within last 6 months    Recent Outpatient Visits           2 months ago Essential hypertension   [place], Vernia Buff, NP   5 months ago Essential hypertension   [place], Vernia Buff, NP   9 months ago Acquired hypothyroidism   [place], Vernia Buff, NP   1 year ago Acquired hypothyroidism   [place] Scot Jun, FNP   1 year ago Essential hypertension   [place], Zelda W, NP       Future Appointments             In 3 weeks [name], NP [place]

## 2022-02-07 ENCOUNTER — Other Ambulatory Visit: Payer: Self-pay

## 2022-02-21 ENCOUNTER — Encounter: Payer: Self-pay | Admitting: Nurse Practitioner

## 2022-02-21 ENCOUNTER — Ambulatory Visit: Payer: 59 | Attending: Nurse Practitioner | Admitting: Nurse Practitioner

## 2022-02-21 ENCOUNTER — Other Ambulatory Visit: Payer: Self-pay

## 2022-02-21 VITALS — BP 127/87 | HR 60 | Wt 266.2 lb

## 2022-02-21 DIAGNOSIS — I10 Essential (primary) hypertension: Secondary | ICD-10-CM

## 2022-02-21 DIAGNOSIS — E039 Hypothyroidism, unspecified: Secondary | ICD-10-CM

## 2022-02-21 DIAGNOSIS — Z23 Encounter for immunization: Secondary | ICD-10-CM

## 2022-02-21 MED ORDER — AMLODIPINE BESYLATE 10 MG PO TABS
10.0000 mg | ORAL_TABLET | Freq: Every day | ORAL | 1 refills | Status: DC
  Filled 2022-02-21 – 2022-02-28 (×2): qty 30, 30d supply, fill #0
  Filled 2022-03-26 – 2022-04-02 (×2): qty 30, 30d supply, fill #1
  Filled 2022-05-04: qty 30, 30d supply, fill #2
  Filled 2022-05-30: qty 30, 30d supply, fill #3

## 2022-02-21 NOTE — Addendum Note (Signed)
Addended by: Dalene Carrow I on: 02/21/2022 03:24 PM   Modules accepted: Orders

## 2022-02-21 NOTE — Progress Notes (Signed)
Assessment & Plan:  Larry Thomas was seen today for hypertension.  Diagnoses and all orders for this visit:  Essential hypertension -     amLODipine (NORVASC) 10 MG tablet; Take 1 tablet (10 mg total) by mouth daily. Continue all antihypertensives as prescribed.  Reminded to bring in blood pressure log for follow  up appointment.  RECOMMENDATIONS: DASH/Mediterranean Diets are healthier choices for HTN.    Acquired hypothyroidism -     Thyroid Panel With TSH    Patient has been counseled on age-appropriate routine health concerns for screening and prevention. These are reviewed and up-to-date. Referrals have been placed accordingly. Immunizations are up-to-date or declined.    Subjective:   Chief Complaint  Patient presents with   Hypertension   Hypertension Pertinent negatives include no blurred vision, chest pain, headaches, malaise/fatigue, palpitations or shortness of breath.   Larry Thomas 46 y.o. male presents to office today for follow up to HTN   HTN Diastolic blood pressure still slightly elevated. Will increase amlodipine to 10 mg today.  BP Readings from Last 3 Encounters:  02/21/22 127/87  11/20/21 129/87  08/16/21 (!) 130/92    Hypothyroidism Taking levothyroxine 175 mg daily as prescribed. Does not endorse any symptoms of hypo or hyperthyroidism Lab Results  Component Value Date   TSH 7.860 (H) 11/20/2021    Review of Systems  Constitutional:  Negative for fever, malaise/fatigue and weight loss.  HENT: Negative.  Negative for nosebleeds.   Eyes: Negative.  Negative for blurred vision, double vision and photophobia.  Respiratory: Negative.  Negative for cough and shortness of breath.   Cardiovascular: Negative.  Negative for chest pain, palpitations and leg swelling.  Gastrointestinal: Negative.  Negative for heartburn, nausea and vomiting.  Musculoskeletal: Negative.  Negative for myalgias.  Neurological: Negative.  Negative for dizziness, focal  weakness, seizures and headaches.  Psychiatric/Behavioral: Negative.  Negative for suicidal ideas.     Past Medical History:  Diagnosis Date   Hypercholesterolemia    Hypertension    Hypothyroidism    Mixed hyperlipidemia 05/18/2017   Thyroid disease     Past Surgical History:  Procedure Laterality Date   BILATERAL CARPAL TUNNEL RELEASE Bilateral 04/12/2015   Procedure: BILATERAL CARPAL TUNNEL RELEASE;  Surgeon: Betha Loa, MD;  Location: [facility];  Service: Orthopedics;  Laterality: Bilateral;    Family History  Problem Relation Age of Onset   Thyroid cancer Mother     Social History Reviewed with no changes to be made today.   Outpatient Medications Prior to Visit  Medication Sig Dispense Refill   levothyroxine (SYNTHROID) 175 MCG tablet Take 1 tablet (175 mcg total) by mouth daily before breakfast. 90 tablet 1   rosuvastatin (CRESTOR) 10 MG tablet Take 1 tablet (10 mg total) by mouth daily. 90 tablet 3   amLODipine (NORVASC) 5 MG tablet Take 1 tablet (5 mg total) by mouth daily. 90 tablet 0   aspirin 81 MG chewable tablet Chew 81 mg by mouth daily as needed for mild pain.     Blood Pressure Monitor DEVI Please provide patient with insurance approved blood pressure monitor ICD 10 I10.0 1 each 0   Disposable Gloves (POWDER FREE NITRILE GLOVES XL) MISC 1 each by Does not apply route daily as needed. Please provide patient with insurance approved gloves due to COVID 19. Z91.89 100 each prn   ibuprofen (ADVIL) 200 MG tablet Take 800 mg by mouth every 6 (six) hours as needed for moderate pain.  Masks MISC 1 each by Does not apply route daily as needed. Please provide patient with insurance approved masks due to COVID 19. Z91.89 100 each prn   Multiple Vitamin (MULTIVITAMIN WITH MINERALS) TABS tablet Take 1 tablet by mouth daily.     No facility-administered medications prior to visit.    No Known Allergies     Objective:    BP 127/87   Pulse 60   Wt 266  lb 3.2 oz (120.7 kg)   SpO2 98%   BMI 39.31 kg/m  Wt Readings from Last 3 Encounters:  02/21/22 266 lb 3.2 oz (120.7 kg)  11/20/21 265 lb (120.2 kg)  08/16/21 263 lb 12.8 oz (119.7 kg)    Physical Exam Vitals and nursing note reviewed.  Constitutional:      Appearance: He is well-developed.  HENT:     Head: Normocephalic and atraumatic.  Cardiovascular:     Rate and Rhythm: Normal rate and regular rhythm.     Heart sounds: Normal heart sounds. No murmur heard.    No friction rub. No gallop.  Pulmonary:     Effort: Pulmonary effort is normal. No tachypnea or respiratory distress.     Breath sounds: Normal breath sounds. No decreased breath sounds, wheezing, rhonchi or rales.  Chest:     Chest wall: No tenderness.  Abdominal:     General: Bowel sounds are normal.     Palpations: Abdomen is soft.  Musculoskeletal:        General: Normal range of motion.     Cervical back: Normal range of motion.  Skin:    General: Skin is warm and dry.  Neurological:     Mental Status: He is alert and oriented to person, place, and time.     Coordination: Coordination normal.  Psychiatric:        Behavior: Behavior normal. Behavior is cooperative.        Thought Content: Thought content normal.        Judgment: Judgment normal.          Patient has been counseled extensively about nutrition and exercise as well as the importance of adherence with medications and regular follow-up. The patient was given clear instructions to go to ER or return to medical center if symptoms don't improve, worsen or new problems develop. The patient verbalized understanding.   Follow-up: Return in about 14 weeks (around 05/30/2022) for HTN.   Claiborne Rigg, FNP-BC [facility] [place], [place] [phone]   02/21/2022, 3:20 PM

## 2022-02-22 LAB — THYROID PANEL WITH TSH
Free Thyroxine Index: 2 (ref 1.2–4.9)
T3 Uptake Ratio: 23 % — ABNORMAL LOW (ref 24–39)
T4, Total: 8.9 ug/dL (ref 4.5–12.0)
TSH: 10.3 u[IU]/mL — ABNORMAL HIGH (ref 0.450–4.500)

## 2022-02-26 ENCOUNTER — Ambulatory Visit: Payer: Self-pay | Admitting: *Deleted

## 2022-02-26 NOTE — Telephone Encounter (Signed)
Pt given lab results per notes of Z. [name], NP from 02/22/22 on 02/26/22. Pt verbalized understanding tyroid levers still elevated. Patient has been taking synthroid 175 mcg every 4 am and has not missed any doses. Do you want to increase dose and then schedule recheck in 4 weeks?

## 2022-02-27 ENCOUNTER — Other Ambulatory Visit: Payer: Self-pay

## 2022-02-28 ENCOUNTER — Other Ambulatory Visit: Payer: Self-pay

## 2022-03-01 ENCOUNTER — Other Ambulatory Visit: Payer: Self-pay | Admitting: Nurse Practitioner

## 2022-03-01 DIAGNOSIS — E039 Hypothyroidism, unspecified: Secondary | ICD-10-CM

## 2022-03-01 MED ORDER — LEVOTHYROXINE SODIUM 200 MCG PO TABS
200.0000 ug | ORAL_TABLET | Freq: Every day | ORAL | 1 refills | Status: DC
  Filled 2022-03-01: qty 30, 30d supply, fill #0
  Filled 2022-03-29: qty 30, 30d supply, fill #1
  Filled 2022-05-04: qty 30, 30d supply, fill #2
  Filled 2022-05-30: qty 30, 30d supply, fill #3

## 2022-03-01 NOTE — Telephone Encounter (Signed)
Dosage increased. Return to lab in 5 weeks

## 2022-03-03 ENCOUNTER — Other Ambulatory Visit: Payer: Self-pay

## 2022-03-03 NOTE — Telephone Encounter (Signed)
Patient identified by name and date of birth. Patient aware of increase of thyroid medicine. Lab appointment scheduled.

## 2022-03-14 ENCOUNTER — Other Ambulatory Visit: Payer: Self-pay

## 2022-04-01 ENCOUNTER — Other Ambulatory Visit: Payer: Self-pay

## 2022-04-02 ENCOUNTER — Other Ambulatory Visit: Payer: Self-pay

## 2022-04-07 ENCOUNTER — Other Ambulatory Visit: Payer: 59

## 2022-04-07 ENCOUNTER — Ambulatory Visit: Payer: 59 | Attending: Nurse Practitioner

## 2022-04-07 DIAGNOSIS — E039 Hypothyroidism, unspecified: Secondary | ICD-10-CM

## 2022-04-08 LAB — THYROID PANEL WITH TSH
Free Thyroxine Index: 2.8 (ref 1.2–4.9)
T3 Uptake Ratio: 27 % (ref 24–39)
T4, Total: 10.4 ug/dL (ref 4.5–12.0)
TSH: 2.84 u[IU]/mL (ref 0.450–4.500)

## 2022-04-11 ENCOUNTER — Other Ambulatory Visit: Payer: Self-pay

## 2022-05-05 ENCOUNTER — Other Ambulatory Visit: Payer: Self-pay

## 2022-05-30 ENCOUNTER — Other Ambulatory Visit: Payer: Self-pay

## 2022-05-30 ENCOUNTER — Encounter: Payer: Self-pay | Admitting: Nurse Practitioner

## 2022-05-30 ENCOUNTER — Ambulatory Visit: Payer: 59 | Attending: Nurse Practitioner | Admitting: Nurse Practitioner

## 2022-05-30 VITALS — BP 129/84 | HR 78 | Ht 69.0 in | Wt 270.8 lb

## 2022-05-30 DIAGNOSIS — I10 Essential (primary) hypertension: Secondary | ICD-10-CM

## 2022-05-30 DIAGNOSIS — E039 Hypothyroidism, unspecified: Secondary | ICD-10-CM

## 2022-05-30 DIAGNOSIS — E785 Hyperlipidemia, unspecified: Secondary | ICD-10-CM | POA: Diagnosis not present

## 2022-05-30 MED ORDER — LEVOTHYROXINE SODIUM 200 MCG PO TABS
200.0000 ug | ORAL_TABLET | Freq: Every day | ORAL | 1 refills | Status: DC
  Filled 2022-06-02 – 2022-06-29 (×2): qty 30, 30d supply, fill #0
  Filled 2022-08-06: qty 30, 30d supply, fill #1
  Filled 2022-09-02: qty 30, 30d supply, fill #2
  Filled 2022-10-03: qty 30, 30d supply, fill #3
  Filled 2022-11-03: qty 30, 30d supply, fill #4
  Filled 2022-11-28: qty 30, 30d supply, fill #5

## 2022-05-30 MED ORDER — ROSUVASTATIN CALCIUM 10 MG PO TABS
10.0000 mg | ORAL_TABLET | Freq: Every day | ORAL | 3 refills | Status: DC
  Filled 2022-05-30: qty 90, 90d supply, fill #0
  Filled 2022-06-02: qty 30, 30d supply, fill #0
  Filled 2022-06-29: qty 30, 30d supply, fill #1
  Filled 2022-08-06: qty 30, 30d supply, fill #2
  Filled 2022-09-02: qty 30, 30d supply, fill #3
  Filled 2022-10-03: qty 30, 30d supply, fill #4
  Filled 2023-03-20: qty 30, 30d supply, fill #5
  Filled 2023-04-25: qty 30, 30d supply, fill #6
  Filled 2023-05-07 – 2023-05-22 (×2): qty 30, 30d supply, fill #7

## 2022-05-30 MED ORDER — AMLODIPINE BESYLATE 10 MG PO TABS
10.0000 mg | ORAL_TABLET | Freq: Every day | ORAL | 1 refills | Status: DC
  Filled 2022-06-02 – 2022-06-29 (×2): qty 30, 30d supply, fill #0
  Filled 2022-08-06: qty 30, 30d supply, fill #1
  Filled 2022-09-02: qty 30, 30d supply, fill #2
  Filled 2022-10-03: qty 30, 30d supply, fill #3

## 2022-05-30 NOTE — Progress Notes (Signed)
Assessment & Plan:  [name] was seen today for hypertension.  Diagnoses and all orders for this visit:  Primary hypertension -     amLODipine (NORVASC) 10 MG tablet; Take 1 tablet (10 mg total) by mouth daily. Continue all antihypertensives as prescribed.  Reminded to bring in blood pressure log for follow  up appointment.  RECOMMENDATIONS: DASH/Mediterranean Diets are healthier choices for HTN.    Acquired hypothyroidism -     levothyroxine (SYNTHROID) 200 MCG tablet; Take 1 tablet (200 mcg total) by mouth daily before breakfast.  Dyslipidemia, goal LDL below 100 -     rosuvastatin (CRESTOR) 10 MG tablet; Take 1 tablet (10 mg total) by mouth daily. Please fill as a 90 day supply    Patient has been counseled on age-appropriate routine health concerns for screening and prevention. These are reviewed and up-to-date. Referrals have been placed accordingly. Immunizations are up-to-date or declined.    Subjective:   Chief Complaint  Patient presents with   Hypertension   Hypertension Pertinent negatives include no blurred vision, chest pain, headaches, malaise/fatigue, palpitations or shortness of breath.   Larry Thomas 47 y.o. male presents to office today for follow up to HTN.  HTN Blood pressure is normal. Weight slightly increased today. Endorses adherence taking amlodipine 10 mg daily.  BP Readings from Last 3 Encounters:  05/30/22 129/84  02/21/22 127/87  11/20/21 129/87    Hypothyroidism Taking levothyroxine 175 mg daily as prescribed. Does not endorse any symptoms of hypo or hyperthyroidism Lab Results  Component Value Date   TSH 2.840 04/07/2022    Review of Systems  Constitutional:  Negative for fever, malaise/fatigue and weight loss.  HENT: Negative.  Negative for nosebleeds.   Eyes: Negative.  Negative for blurred vision, double vision and photophobia.  Respiratory: Negative.  Negative for cough and shortness of breath.   Cardiovascular: Negative.   Negative for chest pain, palpitations and leg swelling.  Gastrointestinal: Negative.  Negative for heartburn, nausea and vomiting.  Musculoskeletal: Negative.  Negative for myalgias.  Neurological: Negative.  Negative for dizziness, focal weakness, seizures and headaches.  Psychiatric/Behavioral: Negative.  Negative for suicidal ideas.     Past Medical History:  Diagnosis Date   Hypercholesterolemia    Hypertension    Hypothyroidism    Mixed hyperlipidemia 05/18/2017   Thyroid disease     Past Surgical History:  Procedure Laterality Date   BILATERAL CARPAL TUNNEL RELEASE Bilateral 04/12/2015   Procedure: BILATERAL CARPAL TUNNEL RELEASE;  Surgeon: Leanora Cover, MD;  Location: [facility];  Service: Orthopedics;  Laterality: Bilateral;    Family History  Problem Relation Age of Onset   Thyroid cancer Mother     Social History Reviewed with no changes to be made today.   Outpatient Medications Prior to Visit  Medication Sig Dispense Refill   aspirin 81 MG chewable tablet Chew 81 mg by mouth daily as needed for mild pain.     Blood Pressure Monitor DEVI Please provide patient with insurance approved blood pressure monitor ICD 10 I10.0 1 each 0   amLODipine (NORVASC) 10 MG tablet Take 1 tablet (10 mg total) by mouth daily. 90 tablet 1   levothyroxine (SYNTHROID) 200 MCG tablet Take 1 tablet (200 mcg total) by mouth daily before breakfast. 90 tablet 1   rosuvastatin (CRESTOR) 10 MG tablet Take 1 tablet (10 mg total) by mouth daily. 90 tablet 3   No facility-administered medications prior to visit.    No Known Allergies  Objective:    BP 129/84   Pulse 78   Ht '5\' 9"'$  (1.753 m)   Wt 270 lb 12.8 oz (122.8 kg)   SpO2 97%   BMI 39.99 kg/m  Wt Readings from Last 3 Encounters:  05/30/22 270 lb 12.8 oz (122.8 kg)  02/21/22 266 lb 3.2 oz (120.7 kg)  11/20/21 265 lb (120.2 kg)    Physical Exam Vitals and nursing note reviewed.  Constitutional:       Appearance: He is well-developed.  HENT:     Head: Normocephalic and atraumatic.  Cardiovascular:     Rate and Rhythm: Normal rate and regular rhythm.     Heart sounds: Normal heart sounds. No murmur heard.    No friction rub. No gallop.  Pulmonary:     Effort: Pulmonary effort is normal. No tachypnea or respiratory distress.     Breath sounds: Normal breath sounds. No decreased breath sounds, wheezing, rhonchi or rales.  Chest:     Chest wall: No tenderness.  Abdominal:     General: Bowel sounds are normal.     Palpations: Abdomen is soft.  Musculoskeletal:        General: Normal range of motion.     Cervical back: Normal range of motion.  Skin:    General: Skin is warm and dry.  Neurological:     Mental Status: He is alert and oriented to person, place, and time.     Coordination: Coordination normal.  Psychiatric:        Behavior: Behavior normal. Behavior is cooperative.        Thought Content: Thought content normal.        Judgment: Judgment normal.          Patient has been counseled extensively about nutrition and exercise as well as the importance of adherence with medications and regular follow-up. The patient was given clear instructions to go to ER or return to medical center if symptoms don't improve, worsen or new problems develop. The patient verbalized understanding.   Follow-up: Return in about 3 months (around 08/30/2022).   Larry Pounds, FNP-BC [facility] and [facility] [place], [place]   05/30/2022, 11:13 AM

## 2022-06-02 ENCOUNTER — Other Ambulatory Visit: Payer: Self-pay

## 2022-06-03 ENCOUNTER — Other Ambulatory Visit: Payer: Self-pay

## 2022-06-30 ENCOUNTER — Other Ambulatory Visit: Payer: Self-pay

## 2022-08-06 ENCOUNTER — Other Ambulatory Visit: Payer: Self-pay

## 2022-09-02 ENCOUNTER — Other Ambulatory Visit: Payer: Self-pay

## 2022-09-04 ENCOUNTER — Other Ambulatory Visit: Payer: Self-pay

## 2022-09-05 ENCOUNTER — Ambulatory Visit: Payer: 59 | Admitting: Nurse Practitioner

## 2022-10-03 ENCOUNTER — Other Ambulatory Visit: Payer: Self-pay

## 2022-10-14 ENCOUNTER — Encounter: Payer: Self-pay | Admitting: Nurse Practitioner

## 2022-11-03 ENCOUNTER — Other Ambulatory Visit: Payer: Self-pay

## 2022-11-14 ENCOUNTER — Other Ambulatory Visit: Payer: Self-pay

## 2022-11-14 ENCOUNTER — Encounter: Payer: Self-pay | Admitting: Nurse Practitioner

## 2022-11-14 ENCOUNTER — Ambulatory Visit: Payer: 59 | Admitting: Nurse Practitioner

## 2022-11-14 VITALS — BP 129/79 | HR 64 | Ht 69.0 in | Wt 274.4 lb

## 2022-11-14 DIAGNOSIS — I10 Essential (primary) hypertension: Secondary | ICD-10-CM

## 2022-11-14 MED ORDER — LOSARTAN POTASSIUM 25 MG PO TABS
25.0000 mg | ORAL_TABLET | Freq: Every day | ORAL | 0 refills | Status: DC
  Filled 2022-11-14: qty 30, 30d supply, fill #0
  Filled 2023-03-20: qty 30, 30d supply, fill #1
  Filled 2023-04-25: qty 30, 30d supply, fill #2

## 2022-11-14 NOTE — Progress Notes (Signed)
Assessment & Plan:  Larry Thomas was seen today for medical management of chronic issues.  Diagnoses and all orders for this visit:  Essential hypertension -     CMP14+EGFR -     losartan (COZAAR) 25 MG tablet; Take 1 tablet (25 mg total) by mouth daily. Continue all antihypertensives as prescribed.  Reminded to bring in blood pressure log for follow  up appointment.  RECOMMENDATIONS: DASH/Mediterranean Diets are healthier choices for HTN.     Patient has been counseled on age-appropriate routine health concerns for screening and prevention. These are reviewed and up-to-date. Referrals have been placed accordingly. Immunizations are up-to-date or declined.    Subjective:   Chief Complaint  Patient presents with   Medical Management of Chronic Issues   HPI Larry Thomas 47 y.o. male presents to office today for follow up to HTN   HTN Blood pressure is well controlled. He stopped taking amlodipine as this was causing dizziness and excessive tearing of eyes. These symptoms have resolved since he stopped taking. Blood pressure is at goal today.  BP Readings from Last 3 Encounters:  11/14/22 129/79  05/30/22 129/84  02/21/22 127/87    He has a ventral hernia he is concerned about. There is no pain associated with the hernia. He does note discomfort in this area when he bends down to tie his shoes. The hernia is above the umbilicus and extends under the xiphoid process    Review of Systems  Constitutional:  Negative for fever, malaise/fatigue and weight loss.  HENT: Negative.  Negative for nosebleeds.   Eyes: Negative.  Negative for blurred vision, double vision and photophobia.  Respiratory: Negative.  Negative for cough and shortness of breath.   Cardiovascular: Negative.  Negative for chest pain, palpitations and leg swelling.  Gastrointestinal: Negative.  Negative for heartburn, nausea and vomiting.  Musculoskeletal: Negative.  Negative for myalgias.  Neurological:  Negative.  Negative for dizziness, focal weakness, seizures and headaches.  Psychiatric/Behavioral: Negative.  Negative for suicidal ideas.     Past Medical History:  Diagnosis Date   Hypercholesterolemia    Hypertension    Hypothyroidism    Mixed hyperlipidemia 05/18/2017   Thyroid disease     Past Surgical History:  Procedure Laterality Date   BILATERAL CARPAL TUNNEL RELEASE Bilateral 04/12/2015   Procedure: BILATERAL CARPAL TUNNEL RELEASE;  Surgeon: Betha Loa, MD;  Location: [facility];  Service: Orthopedics;  Laterality: Bilateral;    Family History  Problem Relation Age of Onset   Thyroid cancer Mother     Social History Reviewed with no changes to be made today.   Outpatient Medications Prior to Visit  Medication Sig Dispense Refill   aspirin 81 MG chewable tablet Chew 81 mg by mouth daily as needed for mild pain.     Blood Pressure Monitor DEVI Please provide patient with insurance approved blood pressure monitor ICD 10 I10.0 1 each 0   levothyroxine (SYNTHROID) 200 MCG tablet Take 1 tablet (200 mcg total) by mouth daily before breakfast. 90 tablet 1   amLODipine (NORVASC) 10 MG tablet Take 1 tablet (10 mg total) by mouth daily. (Patient not taking: Reported on 11/14/2022) 90 tablet 1   rosuvastatin (CRESTOR) 10 MG tablet Take 1 tablet (10 mg total) by mouth daily. (Patient not taking: Reported on 11/14/2022) 90 tablet 3   No facility-administered medications prior to visit.    No Known Allergies     Objective:    BP 129/79   Pulse 64  Ht 5\' 9"  (1.753 m)   Wt 274 lb 6.4 oz (124.5 kg)   SpO2 98%   BMI 40.52 kg/m  Wt Readings from Last 3 Encounters:  11/14/22 274 lb 6.4 oz (124.5 kg)  05/30/22 270 lb 12.8 oz (122.8 kg)  02/21/22 266 lb 3.2 oz (120.7 kg)    Physical Exam Vitals and nursing note reviewed.  Constitutional:      Appearance: He is well-developed.  HENT:     Head: Normocephalic and atraumatic.  Cardiovascular:     Rate and  Rhythm: Normal rate and regular rhythm.     Heart sounds: Normal heart sounds. No murmur heard.    No friction rub. No gallop.  Pulmonary:     Effort: Pulmonary effort is normal. No tachypnea or respiratory distress.     Breath sounds: Normal breath sounds. No decreased breath sounds, wheezing, rhonchi or rales.  Chest:     Chest wall: No tenderness.  Abdominal:     General: Bowel sounds are normal.     Palpations: Abdomen is soft.     Hernia: A hernia is present. Hernia is present in the ventral area.  Musculoskeletal:        General: Normal range of motion.     Cervical back: Normal range of motion.  Skin:    General: Skin is warm and dry.  Neurological:     Mental Status: He is alert and oriented to person, place, and time.     Coordination: Coordination normal.  Psychiatric:        Behavior: Behavior normal. Behavior is cooperative.        Thought Content: Thought content normal.        Judgment: Judgment normal.          Patient has been counseled extensively about nutrition and exercise as well as the importance of adherence with medications and regular follow-up. The patient was given clear instructions to go to ER or return to medical center if symptoms don't improve, worsen or new problems develop. The patient verbalized understanding.   Follow-up: Return in about 3 months (around 02/14/2023).   Claiborne Rigg, FNP-BC [facility] and [facility] [place], [place] [phone]   11/14/2022, 1:24 PM

## 2022-11-15 LAB — CMP14+EGFR
ALT: 25 IU/L (ref 0–44)
AST: 26 IU/L (ref 0–40)
Albumin: 4.2 g/dL (ref 4.1–5.1)
Alkaline Phosphatase: 62 IU/L (ref 44–121)
BUN/Creatinine Ratio: 10 (ref 9–20)
BUN: 10 mg/dL (ref 6–24)
Bilirubin Total: 0.3 mg/dL (ref 0.0–1.2)
CO2: 23 mmol/L (ref 20–29)
Calcium: 9.5 mg/dL (ref 8.7–10.2)
Chloride: 103 mmol/L (ref 96–106)
Creatinine, Ser: 0.98 mg/dL (ref 0.76–1.27)
Globulin, Total: 2.9 g/dL (ref 1.5–4.5)
Glucose: 83 mg/dL (ref 70–99)
Potassium: 4.3 mmol/L (ref 3.5–5.2)
Sodium: 140 mmol/L (ref 134–144)
Total Protein: 7.1 g/dL (ref 6.0–8.5)
eGFR: 96 mL/min/{1.73_m2} (ref >59)

## 2022-11-28 ENCOUNTER — Other Ambulatory Visit: Payer: Self-pay

## 2022-12-01 ENCOUNTER — Other Ambulatory Visit: Payer: Self-pay

## 2022-12-26 ENCOUNTER — Other Ambulatory Visit: Payer: Self-pay | Admitting: Nurse Practitioner

## 2022-12-26 DIAGNOSIS — E039 Hypothyroidism, unspecified: Secondary | ICD-10-CM

## 2022-12-29 ENCOUNTER — Other Ambulatory Visit: Payer: Self-pay

## 2022-12-29 MED ORDER — LEVOTHYROXINE SODIUM 200 MCG PO TABS
200.0000 ug | ORAL_TABLET | Freq: Every day | ORAL | 0 refills | Status: DC
Start: 2022-12-29 — End: 2023-03-20
  Filled 2022-12-29: qty 30, 30d supply, fill #0
  Filled 2023-01-30: qty 30, 30d supply, fill #1
  Filled 2023-02-24: qty 30, 30d supply, fill #2

## 2022-12-29 NOTE — Telephone Encounter (Signed)
Requested Prescriptions  Pending Prescriptions Disp Refills   levothyroxine (SYNTHROID) 200 MCG tablet 90 tablet 0    Sig: Take 1 tablet (200 mcg total) by mouth daily before breakfast.     Endocrinology:  Hypothyroid Agents Passed - 12/26/2022  9:35 PM      Passed - TSH in normal range and within 360 days    TSH  Date Value Ref Range Status  04/07/2022 2.840 0.450 - 4.500 uIU/mL Final         Passed - Valid encounter within last 12 months    Recent Outpatient Visits           1 month ago Essential hypertension   [facility] [place], Shea Stakes, NP   7 months ago Primary hypertension   [facility] [place], Shea Stakes, NP   10 months ago Essential hypertension   [facility] [place], Shea Stakes, NP   1 year ago Essential hypertension   [facility] [place], Shea Stakes, NP   1 year ago Essential hypertension    [facility] Claiborne Rigg, NP       Future Appointments             In 1 month Claiborne Rigg, NP [facility]

## 2022-12-30 ENCOUNTER — Other Ambulatory Visit: Payer: Self-pay

## 2023-01-30 ENCOUNTER — Other Ambulatory Visit: Payer: Self-pay

## 2023-02-16 ENCOUNTER — Encounter: Payer: Self-pay | Admitting: Nurse Practitioner

## 2023-02-17 ENCOUNTER — Other Ambulatory Visit: Payer: Self-pay

## 2023-02-17 NOTE — Telephone Encounter (Signed)
Call was made to schedule appointment, but patient stated that he may not need a referral if he is able to schedule with urology instead. Will keep [place] updated on progress.

## 2023-02-17 NOTE — Telephone Encounter (Signed)
Needs office visit.

## 2023-02-20 ENCOUNTER — Ambulatory Visit: Payer: 59 | Admitting: Nurse Practitioner

## 2023-02-23 ENCOUNTER — Other Ambulatory Visit (HOSPITAL_COMMUNITY): Payer: Self-pay

## 2023-02-23 ENCOUNTER — Other Ambulatory Visit: Payer: Self-pay

## 2023-02-23 DIAGNOSIS — N3941 Urge incontinence: Secondary | ICD-10-CM | POA: Diagnosis not present

## 2023-02-23 MED ORDER — MIRABEGRON ER 25 MG PO TB24
25.0000 mg | ORAL_TABLET | Freq: Every day | ORAL | 11 refills | Status: DC
  Filled 2023-02-23: qty 30, 30d supply, fill #0
  Filled 2023-04-25: qty 30, 30d supply, fill #1
  Filled 2023-04-27: qty 30, 30d supply, fill #0
  Filled 2023-05-07 – 2023-05-29 (×2): qty 30, 30d supply, fill #1
  Filled 2023-06-19 – 2023-07-24 (×3): qty 30, 30d supply, fill #2
  Filled 2023-08-16 – 2023-08-18 (×2): qty 30, 30d supply, fill #3
  Filled 2023-09-24: qty 30, 30d supply, fill #4
  Filled 2023-10-23 (×2): qty 30, 30d supply, fill #5
  Filled 2023-11-26: qty 30, 30d supply, fill #6
  Filled 2024-02-15 – 2024-02-16 (×2): qty 30, 30d supply, fill #7
  Filled ????-??-??: fill #7

## 2023-02-24 ENCOUNTER — Other Ambulatory Visit (HOSPITAL_COMMUNITY): Payer: Self-pay

## 2023-02-25 ENCOUNTER — Other Ambulatory Visit (HOSPITAL_COMMUNITY): Payer: Self-pay

## 2023-02-25 ENCOUNTER — Other Ambulatory Visit: Payer: Self-pay

## 2023-03-04 ENCOUNTER — Ambulatory Visit: Payer: 59 | Admitting: Nurse Practitioner

## 2023-03-05 ENCOUNTER — Ambulatory Visit: Payer: 59 | Admitting: Nurse Practitioner

## 2023-03-06 ENCOUNTER — Ambulatory Visit: Payer: 59 | Admitting: Nurse Practitioner

## 2023-03-20 ENCOUNTER — Other Ambulatory Visit: Payer: Self-pay | Admitting: Nurse Practitioner

## 2023-03-20 ENCOUNTER — Other Ambulatory Visit: Payer: Self-pay

## 2023-03-20 ENCOUNTER — Ambulatory Visit: Payer: 59 | Attending: Internal Medicine | Admitting: Internal Medicine

## 2023-03-20 ENCOUNTER — Encounter: Payer: Self-pay | Admitting: Internal Medicine

## 2023-03-20 VITALS — BP 130/86 | HR 66 | Temp 97.9°F | Ht 69.0 in | Wt 267.0 lb

## 2023-03-20 DIAGNOSIS — E785 Hyperlipidemia, unspecified: Secondary | ICD-10-CM | POA: Diagnosis not present

## 2023-03-20 DIAGNOSIS — E039 Hypothyroidism, unspecified: Secondary | ICD-10-CM

## 2023-03-20 DIAGNOSIS — I10 Essential (primary) hypertension: Secondary | ICD-10-CM

## 2023-03-20 NOTE — Patient Instructions (Signed)
Your blood pressure is above goal which is 130/80 or lower.  I recommend taking the losartan 25 mg as was prescribed by your primary provider on your last visit in August.  Check your blood pressure at least twice a week and record the readings.  You can send the readings to your PCP.

## 2023-03-20 NOTE — Progress Notes (Signed)
Patient ID: Larry Thomas, male    DOB: 1976-01-03  MRN: [id number]  CC: Hypertension (HTN f/u. /No questions / concerns/ No to flu vax)   Subjective: Larry Thomas is a 47 y.o. male who presents for chronic ds management.  PCP is NP [name] whom he last saw 10/2022 His concerns today include:  With history of HTN, aortic atherosclerosis, hypothyroidism, HL,  Discussed the use of AI scribe software for clinical note transcription with the patient, who gave verbal consent to proceed.  History of Present Illness   Larry Thomas, a patient with hypertension and hypothyroidism, presents for a follow-up visit. He was on amlodipine but had stopped due to side effects of dizziness and excessive tearing of the eyes.  When he saw his PCP in August, she placed him on Cozaar 25 mg instead.  However since then he had been in contact with her via MyChart and reported some of his readings without the Cozaar.  She told him to hold off on taking it until he was seen again.  Some of his readings at that time were 126/90, 133/86, 136/95, 136/90.  He has not checked his blood pressure in the past month. He denies any recent headaches or dizziness. 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 He denies any swelling in his left leg.   He also has hypothyroidism, for which he is taking levothyroxine daily. He has not had his thyroid levels checked since January of this year.  He also reports that he stopped taking his prescribed cholesterol medication, rosuvastatin 10mg  daily, in August.  Reports that his PCP is aware.     Patient Active Problem List   Diagnosis Date Noted   Hyperthyroidism 10/12/2020   Aortic atherosclerosis (HCC) 06/20/2019   Mixed hyperlipidemia 05/18/2017   Essential hypertension 05/18/2017     Current Outpatient Medications on File Prior to Visit  Medication Sig Dispense Refill    levothyroxine (SYNTHROID) 200 MCG tablet Take 1 tablet (200 mcg total) by mouth daily before breakfast. 90 tablet 0   losartan (COZAAR) 25 MG tablet Take 1 tablet (25 mg total) by mouth daily. 90 tablet 0   mirabegron ER (MYRBETRIQ) 25 MG TB24 tablet Take 1 tablet (25 mg total) by mouth daily. 30 tablet 11   amLODipine (NORVASC) 10 MG tablet Take 1 tablet (10 mg total) by mouth daily. (Patient not taking: Reported on 03/20/2023) 90 tablet 1   aspirin 81 MG chewable tablet Chew 81 mg by mouth daily as needed for mild pain. (Patient not taking: Reported on 03/20/2023)     Blood Pressure Monitor DEVI Please provide patient with insurance approved blood pressure monitor ICD 10 I10.0 (Patient not taking: Reported on 03/20/2023) 1 each 0   rosuvastatin (CRESTOR) 10 MG tablet Take 1 tablet (10 mg total) by mouth daily. (Patient not taking: Reported on 03/20/2023) 90 tablet 3   No current facility-administered medications on file prior to visit.    No Known Allergies  Social History   Socioeconomic History   Marital status: Married    Spouse name: Not on file   Number of children: Not on file   Years of education: Not on file   Highest education level: Associate degree: occupational, Scientist, product/process development, or vocational program  Occupational History   Not on file  Tobacco Use   Smoking status: Former   Smokeless tobacco: Never  Advertising account planner  Vaping status: Never Used  Substance and Sexual Activity   Alcohol use: No   Drug use: No   Sexual activity: Yes  Other Topics Concern   Not on file  Social History Narrative   Patient is married.    Social Drivers of Corporate investment banker Strain: Low Risk  (03/16/2023)   Overall Financial Resource Strain (CARDIA)    Difficulty of Paying Living Expenses: Not hard at all  Food Insecurity: No Food Insecurity (03/16/2023)   Hunger Vital Sign    Worried About Running Out of Food in the Last Year: Never true    Ran Out of Food in the Last Year: Never  true  Transportation Needs: No Transportation Needs (03/16/2023)   PRAPARE - Administrator, Civil Service (Medical): No    Lack of Transportation (Non-Medical): No  Physical Activity: Insufficiently Active (03/16/2023)   Exercise Vital Sign    Days of Exercise per Week: 4 days    Minutes of Exercise per Session: 30 min  Stress: No Stress Concern Present (03/16/2023)   [employer] of Occupational Health - Occupational Stress Questionnaire    Feeling of Stress : Not at all  Social Connections: Moderately Integrated (03/16/2023)   Social Connection and Isolation Panel [NHANES]    Frequency of Communication with Friends and Family: More than three times a week    Frequency of Social Gatherings with Friends and Family: More than three times a week    Attends Religious Services: Never    Database administrator or Organizations: Yes    Attends Engineer, structural: More than 4 times per year    Marital Status: Married  Catering manager Violence: Not on file    Family History  Problem Relation Age of Onset   Thyroid cancer Mother     Past Surgical History:  Procedure Laterality Date   BILATERAL CARPAL TUNNEL RELEASE Bilateral 04/12/2015   Procedure: BILATERAL CARPAL TUNNEL RELEASE;  Surgeon: Betha Loa, MD;  Location: [facility];  Service: Orthopedics;  Laterality: Bilateral;    ROS: Review of Systems Negative except as stated above  PHYSICAL EXAM: BP 130/86   Pulse 66   Temp 97.9 F (36.6 C) (Oral)   Ht 5\' 9"  (1.753 m)   Wt 267 lb (121.1 kg)   SpO2 99%   BMI 39.43 kg/m   Physical Exam   General appearance - alert, well appearing, middle age AAM and in no distress Mental status - normal mood, behavior, speech, dress, motor activity, and thought processes Chest - clear to auscultation, no wheezes, rales or rhonchi, symmetric air entry Heart - normal rate, regular rhythm, normal S1, S2, no murmurs, rubs, clicks or  gallops Extremities - trace edema RT LE and no edema LLE     Latest Ref Rng & Units 11/14/2022   11:40 AM 09/17/2021   11:49 AM 08/16/2021    1:52 PM  CMP  Glucose 70 - 99 mg/dL 83  82  74   BUN 6 - 24 mg/dL 10  7  9    Creatinine 0.76 - 1.27 mg/dL 1.61  0.96  0.45   Sodium 134 - 144 mmol/L 140  140  139   Potassium 3.5 - 5.2 mmol/L 4.3  4.6  4.2   Chloride 96 - 106 mmol/L 103  105  103   CO2 20 - 29 mmol/L 23  19  25    Calcium 8.7 - 10.2 mg/dL 9.5  9.0  9.7  Total Protein 6.0 - 8.5 g/dL 7.1  7.4  7.8   Total Bilirubin 0.0 - 1.2 mg/dL 0.3  0.4  0.6   Alkaline Phos 44 - 121 IU/L 62  65  63   AST 0 - 40 IU/L 26  29  27    ALT 0 - 44 IU/L 25  24  22     Lipid Panel     Component Value Date/Time   CHOL 141 09/17/2021 1149   TRIG 82 09/17/2021 1149   HDL 36 (L) 09/17/2021 1149   CHOLHDL 3.9 09/17/2021 1149   LDLCALC 89 09/17/2021 1149    CBC    Component Value Date/Time   WBC 4.5 08/16/2021 1352   WBC 6.6 06/05/2019 1600   RBC 5.08 08/16/2021 1352   RBC 5.57 06/05/2019 1600   HGB 14.5 08/16/2021 1352   HCT 42.2 08/16/2021 1352   PLT 247 08/16/2021 1352   MCV 83 08/16/2021 1352   MCH 28.5 08/16/2021 1352   MCH 28.4 06/05/2019 1600   MCHC 34.4 08/16/2021 1352   MCHC 33.2 06/05/2019 1600   RDW 12.6 08/16/2021 1352   LYMPHSABS 1.8 06/05/2019 1600   MONOABS 0.6 06/05/2019 1600   EOSABS 0.0 06/05/2019 1600   BASOSABS 0.1 06/05/2019 1600    ASSESSMENT AND PLAN: 1. Essential hypertension (Primary) Not at goal which is 130/80 or lower. Recommend that he restart the Cozaar 25 mg daily and check blood pressure at least twice a week and record his readings.  They can send readings to his PCP.  2. Acquired hypothyroidism Continue levothyroxine 200 mcg daily.  Due for TSH check today. - TSH  3. Dyslipidemia, goal LDL below 100 Patient discontinued taking Crestor 4 months ago.  We will recheck lipid profile today.  Encouraged him to continue healthy eating habits and regular  exercise. - Lipid panel    Patient was given the opportunity to ask questions.  Patient verbalized understanding of the plan and was able to repeat key elements of the plan.   This documentation was completed using Paediatric nurse.  Any transcriptional errors are unintentional.  Orders Placed This Encounter  Procedures   TSH   Lipid panel     Requested Prescriptions    No prescriptions requested or ordered in this encounter    Return in about 4 months (around 07/19/2023) for Give f/u with [name].  Jonah Blue, MD, FACP

## 2023-03-21 LAB — LIPID PANEL
Chol/HDL Ratio: 5.4 {ratio} — ABNORMAL HIGH (ref 0.0–5.0)
Cholesterol, Total: 200 mg/dL — ABNORMAL HIGH (ref 100–199)
HDL: 37 mg/dL — ABNORMAL LOW (ref >39)
LDL Chol Calc (NIH): 141 mg/dL — ABNORMAL HIGH (ref 0–99)
Triglycerides: 122 mg/dL (ref 0–149)
VLDL Cholesterol Cal: 22 mg/dL (ref 5–40)

## 2023-03-21 LAB — TSH: TSH: 1.08 u[IU]/mL (ref 0.450–4.500)

## 2023-03-23 ENCOUNTER — Other Ambulatory Visit: Payer: Self-pay

## 2023-03-23 MED ORDER — LEVOTHYROXINE SODIUM 200 MCG PO TABS
200.0000 ug | ORAL_TABLET | Freq: Every day | ORAL | 0 refills | Status: DC
Start: 2023-03-23 — End: 2023-06-19
  Filled 2023-03-23: qty 30, 30d supply, fill #0
  Filled 2023-04-25: qty 30, 30d supply, fill #1
  Filled 2023-05-22: qty 30, 30d supply, fill #2

## 2023-03-26 ENCOUNTER — Other Ambulatory Visit: Payer: Self-pay

## 2023-03-27 ENCOUNTER — Other Ambulatory Visit: Payer: Self-pay

## 2023-04-27 ENCOUNTER — Other Ambulatory Visit: Payer: Self-pay

## 2023-04-28 ENCOUNTER — Other Ambulatory Visit: Payer: Self-pay

## 2023-05-07 ENCOUNTER — Other Ambulatory Visit: Payer: Self-pay

## 2023-05-22 ENCOUNTER — Other Ambulatory Visit: Payer: Self-pay

## 2023-05-22 ENCOUNTER — Other Ambulatory Visit: Payer: Self-pay | Admitting: Nurse Practitioner

## 2023-05-22 DIAGNOSIS — I10 Essential (primary) hypertension: Secondary | ICD-10-CM

## 2023-05-22 MED ORDER — LOSARTAN POTASSIUM 25 MG PO TABS
25.0000 mg | ORAL_TABLET | Freq: Every day | ORAL | 0 refills | Status: DC
  Filled 2023-05-22: qty 30, 30d supply, fill #0
  Filled 2023-06-19 (×2): qty 30, 30d supply, fill #1
  Filled 2023-07-21: qty 30, 30d supply, fill #2

## 2023-05-25 ENCOUNTER — Other Ambulatory Visit: Payer: Self-pay

## 2023-05-29 ENCOUNTER — Other Ambulatory Visit: Payer: Self-pay

## 2023-05-29 DIAGNOSIS — N3941 Urge incontinence: Secondary | ICD-10-CM | POA: Diagnosis not present

## 2023-06-02 ENCOUNTER — Other Ambulatory Visit: Payer: Self-pay

## 2023-06-19 ENCOUNTER — Other Ambulatory Visit: Payer: Self-pay

## 2023-06-19 ENCOUNTER — Other Ambulatory Visit: Payer: Self-pay | Admitting: Family Medicine

## 2023-06-19 DIAGNOSIS — E039 Hypothyroidism, unspecified: Secondary | ICD-10-CM

## 2023-06-19 MED ORDER — LEVOTHYROXINE SODIUM 200 MCG PO TABS
200.0000 ug | ORAL_TABLET | Freq: Every day | ORAL | 0 refills | Status: DC
  Filled 2023-06-19: qty 30, 30d supply, fill #0
  Filled 2023-07-21: qty 30, 30d supply, fill #1
  Filled 2023-08-16: qty 30, 30d supply, fill #2

## 2023-06-22 ENCOUNTER — Other Ambulatory Visit: Payer: Self-pay

## 2023-06-22 ENCOUNTER — Other Ambulatory Visit: Payer: Self-pay | Admitting: Nurse Practitioner

## 2023-06-22 DIAGNOSIS — E785 Hyperlipidemia, unspecified: Secondary | ICD-10-CM

## 2023-06-23 ENCOUNTER — Other Ambulatory Visit: Payer: Self-pay | Admitting: Nurse Practitioner

## 2023-06-23 ENCOUNTER — Other Ambulatory Visit: Payer: Self-pay

## 2023-06-23 DIAGNOSIS — E785 Hyperlipidemia, unspecified: Secondary | ICD-10-CM

## 2023-06-23 MED ORDER — ROSUVASTATIN CALCIUM 10 MG PO TABS
10.0000 mg | ORAL_TABLET | Freq: Every day | ORAL | 0 refills | Status: DC
  Filled 2023-06-23: qty 30, 30d supply, fill #0
  Filled 2023-07-24: qty 30, 30d supply, fill #1
  Filled 2023-08-16: qty 30, 30d supply, fill #2

## 2023-06-24 ENCOUNTER — Other Ambulatory Visit: Payer: Self-pay

## 2023-06-26 ENCOUNTER — Other Ambulatory Visit: Payer: Self-pay

## 2023-07-21 ENCOUNTER — Other Ambulatory Visit: Payer: Self-pay

## 2023-07-24 ENCOUNTER — Encounter: Payer: Self-pay | Admitting: Nurse Practitioner

## 2023-07-24 ENCOUNTER — Other Ambulatory Visit: Payer: Self-pay

## 2023-07-24 ENCOUNTER — Ambulatory Visit: Payer: 59 | Attending: Nurse Practitioner | Admitting: Nurse Practitioner

## 2023-07-24 VITALS — BP 134/88 | HR 66 | Resp 19 | Ht 69.0 in | Wt 267.8 lb

## 2023-07-24 DIAGNOSIS — E039 Hypothyroidism, unspecified: Secondary | ICD-10-CM | POA: Diagnosis not present

## 2023-07-24 DIAGNOSIS — H6240 Otitis externa in other diseases classified elsewhere, unspecified ear: Secondary | ICD-10-CM

## 2023-07-24 DIAGNOSIS — B369 Superficial mycosis, unspecified: Secondary | ICD-10-CM | POA: Diagnosis not present

## 2023-07-24 DIAGNOSIS — I10 Essential (primary) hypertension: Secondary | ICD-10-CM

## 2023-07-24 DIAGNOSIS — Z1211 Encounter for screening for malignant neoplasm of colon: Secondary | ICD-10-CM

## 2023-07-24 MED ORDER — CLOTRIMAZOLE-BETAMETHASONE 1-0.05 % EX CREA
1.0000 | TOPICAL_CREAM | Freq: Every day | CUTANEOUS | 1 refills | Status: AC
Start: 2023-07-24 — End: ?
  Filled 2023-07-24: qty 30, 30d supply, fill #0
  Filled 2023-09-24: qty 30, 30d supply, fill #1

## 2023-07-24 NOTE — Progress Notes (Signed)
 Assessment & Plan:  Larry Thomas was seen today for hypertension.  Diagnoses and all orders for this visit:  Primary hypertension -     CMP14+EGFR  Acquired hypothyroidism -     Thyroid  Panel With TSH -     CMP14+EGFR -     CBC with Differential  Fungal ear infection -     clotrimazole -betamethasone  (LOTRISONE ) cream; Apply 1 Application topically daily. -     CBC with Differential  Colon cancer screening -     Ambulatory referral to Gastroenterology    Patient has been counseled on age-appropriate routine health concerns for screening and prevention. These are reviewed and up-to-date. Referrals have been placed accordingly. Immunizations are up-to-date or declined.    Subjective:   Chief Complaint  Patient presents with   Hypertension         Larry Thomas 48 y.o. male presents to office today for follow up to HTN  PMH: HPL, HTN, acquired hypothyroidism, urge incontinence (followed by urology and taking Myrbetriq )  Blood pressure is well controlled. He is taking amlodipine  daily as well as losartan  25 mg daily BP Readings from Last 3 Encounters:  07/24/23 134/88  03/20/23 130/86  11/14/22 129/79    Hypothyroidism Denies any symptoms of hypo or hyperthyroid as of today. Lab Results  Component Value Date   TSH 1.080 03/20/2023     He has a fungal infection behind the left ear that has been bothering him for several months with itching and irritation.  Review of Systems  Constitutional:  Negative for fever, malaise/fatigue and weight loss.  HENT: Negative.  Negative for nosebleeds.   Eyes: Negative.  Negative for blurred vision, double vision and photophobia.  Respiratory: Negative.  Negative for cough and shortness of breath.   Cardiovascular: Negative.  Negative for chest pain, palpitations and leg swelling.  Gastrointestinal: Negative.  Negative for heartburn, nausea and vomiting.  Musculoskeletal: Negative.  Negative for myalgias.  Skin:  Positive for  itching and rash.  Neurological: Negative.  Negative for dizziness, focal weakness, seizures and headaches.  Psychiatric/Behavioral: Negative.  Negative for suicidal ideas.     Past Medical History:  Diagnosis Date   Hypercholesterolemia    Hypertension    Hypothyroidism    Mixed hyperlipidemia 05/18/2017   Thyroid  disease     Past Surgical History:  Procedure Laterality Date   BILATERAL CARPAL TUNNEL RELEASE Bilateral 04/12/2015   Procedure: BILATERAL CARPAL TUNNEL RELEASE;  Surgeon: Brunilda Capra, MD;  Location: [facility];  Service: Orthopedics;  Laterality: Bilateral;    Family History  Problem Relation Age of Onset   Thyroid  cancer Mother     Social History Reviewed with no changes to be made today.   Outpatient Medications Prior to Visit  Medication Sig Dispense Refill   amLODipine  (NORVASC ) 10 MG tablet Take 1 tablet (10 mg total) by mouth daily. 90 tablet 1   aspirin  81 MG chewable tablet Chew 81 mg by mouth daily as needed for mild pain (pain score 1-3).     Blood Pressure Monitor DEVI Please provide patient with insurance approved blood pressure monitor ICD 10 I10.0 1 each 0   levothyroxine  (SYNTHROID ) 200 MCG tablet Take 1 tablet (200 mcg total) by mouth daily before breakfast. 90 tablet 0   losartan  (COZAAR ) 25 MG tablet Take 1 tablet (25 mg total) by mouth daily. 90 tablet 0   mirabegron  ER (MYRBETRIQ ) 25 MG TB24 tablet Take 1 tablet (25 mg total) by mouth daily. 30  tablet 11   rosuvastatin  (CRESTOR ) 10 MG tablet Take 1 tablet (10 mg total) by mouth daily. 90 tablet 0   No facility-administered medications prior to visit.    No Known Allergies     Objective:    BP 134/88 (BP Location: Left Arm, Patient Position: Sitting, Cuff Size: Large)   Pulse 66   Resp 19   Ht 5\' 9"  (1.753 m)   Wt 267 lb 12.8 oz (121.5 kg)   SpO2 100%   BMI 39.55 kg/m  Wt Readings from Last 3 Encounters:  07/24/23 267 lb 12.8 oz (121.5 kg)  03/20/23 267 lb (121.1 kg)   11/14/22 274 lb 6.4 oz (124.5 kg)    Physical Exam Vitals and nursing note reviewed.  Constitutional:      Appearance: He is well-developed.  HENT:     Head: Normocephalic and atraumatic.  Cardiovascular:     Rate and Rhythm: Normal rate and regular rhythm.     Heart sounds: Normal heart sounds. No murmur heard.    No friction rub. No gallop.  Pulmonary:     Effort: Pulmonary effort is normal. No tachypnea or respiratory distress.     Breath sounds: Normal breath sounds. No decreased breath sounds, wheezing, rhonchi or rales.  Chest:     Chest wall: No tenderness.  Abdominal:     General: Bowel sounds are normal.     Palpations: Abdomen is soft.  Musculoskeletal:        General: Normal range of motion.     Cervical back: Normal range of motion.  Skin:    General: Skin is warm and dry.     Comments: See photo  Neurological:     Mental Status: He is alert and oriented to person, place, and time.     Coordination: Coordination normal.  Psychiatric:        Behavior: Behavior normal. Behavior is cooperative.        Thought Content: Thought content normal.        Judgment: Judgment normal.          Patient has been counseled extensively about nutrition and exercise as well as the importance of adherence with medications and regular follow-up. The patient was given clear instructions to go to ER or return to medical center if symptoms don't improve, worsen or new problems develop. The patient verbalized understanding.   Follow-up: Return in about 4 months (around 11/24/2023).   Collins Dean, FNP-BC [facility] [place], [place] [phone]   07/24/2023, 8:58 AM

## 2023-07-25 ENCOUNTER — Encounter: Payer: Self-pay | Admitting: Nurse Practitioner

## 2023-07-25 LAB — THYROID PANEL WITH TSH
Free Thyroxine Index: 3 (ref 1.2–4.9)
T3 Uptake Ratio: 29 % (ref 24–39)
T4, Total: 10.4 ug/dL (ref 4.5–12.0)
TSH: 0.687 u[IU]/mL (ref 0.450–4.500)

## 2023-07-25 LAB — CBC WITH DIFFERENTIAL/PLATELET
Basophils Absolute: 0 10*3/uL (ref 0.0–0.2)
Basos: 1 %
EOS (ABSOLUTE): 0.1 10*3/uL (ref 0.0–0.4)
Eos: 3 %
Hematocrit: 42.4 % (ref 37.5–51.0)
Hemoglobin: 14.3 g/dL (ref 13.0–17.7)
Immature Grans (Abs): 0 10*3/uL (ref 0.0–0.1)
Immature Granulocytes: 0 %
Lymphocytes Absolute: 1.5 10*3/uL (ref 0.7–3.1)
Lymphs: 35 %
MCH: 28.4 pg (ref 26.6–33.0)
MCHC: 33.7 g/dL (ref 31.5–35.7)
MCV: 84 fL (ref 79–97)
Monocytes Absolute: 0.4 10*3/uL (ref 0.1–0.9)
Monocytes: 11 %
Neutrophils Absolute: 2.1 10*3/uL (ref 1.4–7.0)
Neutrophils: 50 %
Platelets: 228 10*3/uL (ref 150–450)
RBC: 5.03 x10E6/uL (ref 4.14–5.80)
RDW: 11.2 % — ABNORMAL LOW (ref 11.6–15.4)
WBC: 4.2 10*3/uL (ref 3.4–10.8)

## 2023-07-25 LAB — CMP14+EGFR
ALT: 24 IU/L (ref 0–44)
AST: 23 IU/L (ref 0–40)
Albumin: 4.3 g/dL (ref 4.1–5.1)
Alkaline Phosphatase: 64 IU/L (ref 44–121)
BUN/Creatinine Ratio: 9 (ref 9–20)
BUN: 10 mg/dL (ref 6–24)
Bilirubin Total: 0.4 mg/dL (ref 0.0–1.2)
CO2: 23 mmol/L (ref 20–29)
Calcium: 9.5 mg/dL (ref 8.7–10.2)
Chloride: 103 mmol/L (ref 96–106)
Creatinine, Ser: 1.09 mg/dL (ref 0.76–1.27)
Globulin, Total: 2.9 g/dL (ref 1.5–4.5)
Glucose: 87 mg/dL (ref 70–99)
Potassium: 4.4 mmol/L (ref 3.5–5.2)
Sodium: 141 mmol/L (ref 134–144)
Total Protein: 7.2 g/dL (ref 6.0–8.5)
eGFR: 84 mL/min/{1.73_m2} (ref >59)

## 2023-07-28 ENCOUNTER — Encounter: Payer: Self-pay | Admitting: Nurse Practitioner

## 2023-08-12 NOTE — Progress Notes (Signed)
 Larry Thomas [id number] 01/23/1976   Chief Complaint: Rectal bleeding, discuss colonoscopy  Referring Provider: Collins Dean, NP Primary GI MD: Unassigned  HPI: Larry Thomas is a 48 y.o. male with past medical history of HTN, HLD, hypothyroidism, hepatic steatosis on CT 2019 who presents today for a complaint of rectal bleeding and to discuss colonoscopy.  Patient states he has recently noticed a change in his bowel habits.  Used to have 2-3 bowel movements daily, now having 1-2 daily.  Occasionally he feels constipated and may pass a hard stool.  Can have rectal discomfort while passing a hard stool which resolves after bowel movement.  On a couple occasions he has noticed some blood on the toilet paper.  A few weeks ago had blood on the toilet paper as well as in the toilet.  States he did some home FOBT tests which were negative.  Has not noticed bleeding in 2 to 3 weeks.  He has diarrhea if he consumes milk products, otherwise denies loose stools.  Denies abdominal pain, nausea, vomiting.  He has had some fatigue, and just feeling "off."   He reports occasional acid reflux after burping, usually if he drinks coffee on an empty stomach or consume spicy foods.  This happens about once a week and he takes antacids as needed.  Denies dysphagia or heartburn.  Denies smoking or alcohol use.  Denies family history of colon cancer or colon polyps. Denies family history of liver disease.  Labs 07/24/2023: Normal CBC, CMP, TSH  No prior colonoscopy.  Previous GI Procedures/Imaging   CT angio chest/abdomen/pelvis for dissection 2021 (acute chest and abdominal pain with palpitations, dizziness, nausea) 1. No acute abnormality. No evidence of thoracic aortic aneurysm or dissection. No evidence of pulmonary emboli. 2. Punctate nonobstructing LEFT renal calculus. 3.  Aortic Atherosclerosis (ICD10-I70.0).  CT A/P 03/30/2017 1. No acute abnormality or explanation for abdominal  pain. 2. Hepatic steatosis. 3. Mild aortic atherosclerosis (ICD10-I70.0).  Past Medical History:  Diagnosis Date   Hypercholesterolemia    Hypertension    Hypothyroidism    Mixed hyperlipidemia 05/18/2017   Thyroid  disease     Past Surgical History:  Procedure Laterality Date   BILATERAL CARPAL TUNNEL RELEASE Bilateral 04/12/2015   Procedure: BILATERAL CARPAL TUNNEL RELEASE;  Surgeon: Brunilda Capra, MD;  Location: [facility];  Service: Orthopedics;  Laterality: Bilateral;    Current Outpatient Medications  Medication Sig Dispense Refill   amLODipine  (NORVASC ) 10 MG tablet Take 1 tablet (10 mg total) by mouth daily. 90 tablet 1   aspirin  81 MG chewable tablet Chew 81 mg by mouth daily as needed for mild pain (pain score 1-3).     Blood Pressure Monitor DEVI Please provide patient with insurance approved blood pressure monitor ICD 10 I10.0 1 each 0   clotrimazole -betamethasone  (LOTRISONE ) cream Apply 1 Application topically daily. 30 g 1   levothyroxine  (SYNTHROID ) 200 MCG tablet Take 1 tablet (200 mcg total) by mouth daily before breakfast. 90 tablet 0   losartan  (COZAAR ) 25 MG tablet Take 1 tablet (25 mg total) by mouth daily. 90 tablet 0   mirabegron  ER (MYRBETRIQ ) 25 MG TB24 tablet Take 1 tablet (25 mg total) by mouth daily. 30 tablet 11   rosuvastatin  (CRESTOR ) 10 MG tablet Take 1 tablet (10 mg total) by mouth daily. 90 tablet 0   No current facility-administered medications for this visit.    Allergies as of 08/13/2023   (No Known Allergies)    Family  History  Problem Relation Age of Onset   Thyroid  cancer Mother     Social History   Tobacco Use   Smoking status: Former   Smokeless tobacco: Never  Advertising account planner   Vaping status: Never Used  Substance Use Topics   Alcohol use: No   Drug use: No     Review of Systems:    Constitutional: No weight loss, fever, chills, weakness. Positive fatigue. Eyes: No change in vision Ears, Nose, Throat:  No change  in hearing or congestion Skin: No rash or itching Cardiovascular: No chest pain, chest pressure or palpitations   Respiratory: No SOB or cough Gastrointestinal: See HPI and otherwise negative Genitourinary: No dysuria or change in urinary frequency Neurological: No headache, dizziness or syncope Musculoskeletal: No new muscle or joint pain Hematologic: No bruising    Physical Exam:  Vital signs: BP 122/60   Pulse 62   Ht 5\' 9"  (1.753 m)   Wt 270 lb (122.5 kg)   SpO2 97%   BMI 39.87 kg/m    Constitutional: NAD, Well developed, Well nourished, alert and cooperative Head:  Normocephalic and atraumatic.  Eyes: No scleral icterus. Conjunctiva pink. Mouth: No oral lesions. Respiratory: Respirations even and unlabored. Lungs clear to auscultation bilaterally.  No wheezes, crackles, or rhonchi.  Cardiovascular:  Regular rate and rhythm. No murmurs. No peripheral edema. Gastrointestinal:  Soft, nondistended, nontender. No rebound or guarding. Normal bowel sounds. No appreciable masses or hepatomegaly. Rectal: Deferred to colonoscopy. Neurologic:  Alert and oriented x4;  grossly normal neurologically.  Skin:   Dry and intact without significant lesions or rashes. Psychiatric: Oriented to person, place and time. Demonstrates good judgement and reason without abnormal affect or behaviors.   RELEVANT LABS AND IMAGING: CBC    Component Value Date/Time   WBC 4.2 07/24/2023 0904   WBC 6.6 06/05/2019 1600   RBC 5.03 07/24/2023 0904   RBC 5.57 06/05/2019 1600   HGB 14.3 07/24/2023 0904   HCT 42.4 07/24/2023 0904   PLT 228 07/24/2023 0904   MCV 84 07/24/2023 0904   MCH 28.4 07/24/2023 0904   MCH 28.4 06/05/2019 1600   MCHC 33.7 07/24/2023 0904   MCHC 33.2 06/05/2019 1600   RDW 11.2 (L) 07/24/2023 0904   LYMPHSABS 1.5 07/24/2023 0904   MONOABS 0.6 06/05/2019 1600   EOSABS 0.1 07/24/2023 0904   BASOSABS 0.0 07/24/2023 0904    CMP     Component Value Date/Time   NA 141  07/24/2023 0904   K 4.4 07/24/2023 0904   CL 103 07/24/2023 0904   CO2 23 07/24/2023 0904   GLUCOSE 87 07/24/2023 0904   GLUCOSE 70 06/05/2019 1600   BUN 10 07/24/2023 0904   CREATININE 1.09 07/24/2023 0904   CALCIUM  9.5 07/24/2023 0904   PROT 7.2 07/24/2023 0904   ALBUMIN 4.3 07/24/2023 0904   AST 23 07/24/2023 0904   ALT 24 07/24/2023 0904   ALKPHOS 64 07/24/2023 0904   BILITOT 0.4 07/24/2023 0904   GFRNONAA 94 02/23/2020 1706   GFRAA 109 02/23/2020 1706   Echocardiogram 05/20/2018  1. The left ventricle has normal systolic function, with an ejection  fraction of 55-60%. The cavity size was normal. There is mildly increased  left ventricular wall thickness. Left ventricular diastolic Doppler  parameters are consistent with impaired  relaxation Indeterminent filling pressures The E/e' is 8-15.   2. The right ventricle has normal systolic function. The cavity was  normal. There is no increase in right ventricular wall thickness.  3. Left atrial size was mildly dilated.   4. The mitral valve is normal in structure. Mild thickening of the mitral  valve leaflet.   5. The tricuspid valve is normal in structure.   6. The aortic valve is tricuspid Mild sclerosis of the aortic valve.   7. The ascending aorta and aortic root are normal in size and structure.   Assessment/Plan:   Change in bowel habits  Constipation  Rectal bleeding Patient with recent change in bowel habits, having less frequent bowel movements along with occasional hard stools and straining.  Has noticed blood on the toilet paper a couple times, as well as blood in the toilet a few weeks ago.  No prior colonoscopy.  - Schedule colonoscopy in [facility]. I thoroughly discussed the procedure with the patient to include nature of the procedure, alternatives, benefits, and risks (including but not limited to bleeding, infection, perforation, anesthesia/cardiac/pulmonary complications). Patient verbalized understanding and gave  verbal consent to proceed with procedure.  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 Hepatic steatosis on CT 2019, though there was no mention of this on CT angio 2021.  - FIB-4 score 0.99, approximate fibrosis stage 0-1, advanced fibrosis excluded - No abdominal imaging since 2021, will order RUQ US . If hepatic steatosis again seen, can consider regular monitoring of labs/imaging vs further workup. - Abstain from all alcohol including beer, wine, liquor, and non-alcoholic beer.  - Work to maintain a health weight through portion control and exercise  - Maximize control of any hypergycemia and hyperlipidemia   GERD Patient with acid reflux about once a week, seems to be triggered by drinking coffee on an empty stomach and spicy foods.  Relieved with OTC antacids as needed. - Lifestyle modifications for GERD.  - Avoid triggers of acid reflux - Pepcid OTC as needed - If symptoms worsen/become more frequent, consider treatment with PPI  Valiant Gaul, PA-C [facility] 08/12/2023, 12:20 PM  Patient Care Team: Collins Dean, NP as PCP - General (Nurse Practitioner) Sheryle Donning, MD as PCP - Cardiology (Cardiology)

## 2023-08-13 ENCOUNTER — Other Ambulatory Visit: Payer: Self-pay

## 2023-08-13 ENCOUNTER — Ambulatory Visit (INDEPENDENT_AMBULATORY_CARE_PROVIDER_SITE_OTHER): Admitting: Gastroenterology

## 2023-08-13 ENCOUNTER — Encounter: Payer: Self-pay | Admitting: Gastroenterology

## 2023-08-13 VITALS — BP 122/60 | HR 62 | Ht 69.0 in | Wt 270.0 lb

## 2023-08-13 DIAGNOSIS — R194 Change in bowel habit: Secondary | ICD-10-CM

## 2023-08-13 DIAGNOSIS — K5900 Constipation, unspecified: Secondary | ICD-10-CM | POA: Diagnosis not present

## 2023-08-13 DIAGNOSIS — K625 Hemorrhage of anus and rectum: Secondary | ICD-10-CM | POA: Diagnosis not present

## 2023-08-13 DIAGNOSIS — K760 Fatty (change of) liver, not elsewhere classified: Secondary | ICD-10-CM | POA: Diagnosis not present

## 2023-08-13 DIAGNOSIS — K219 Gastro-esophageal reflux disease without esophagitis: Secondary | ICD-10-CM

## 2023-08-13 MED ORDER — NA SULFATE-K SULFATE-MG SULF 17.5-3.13-1.6 GM/177ML PO SOLN
1.0000 | ORAL | 0 refills | Status: DC
  Filled 2023-08-13 – 2023-08-20 (×2): qty 354, 2d supply, fill #0

## 2023-08-13 NOTE — Patient Instructions (Signed)
 You have been scheduled for an abdominal ultrasound at [facility] (1st floor of hospital) on 08/21/23 at 8:30 am . Please arrive 15 minutes prior to your appointment for registration. Make certain not to have anything to eat or drink 6 hours prior to your appointment. Should you need to reschedule your appointment, please contact radiology at [phone]. This test typically takes about 30 minutes to perform.  We have sent the following medications to your pharmacy for you to pick up at your convenience: Suprep   You have been scheduled for a colonoscopy. Please follow written instructions given to you at your visit today.   If you use inhalers (even only as needed), please bring them with you on the day of your procedure.  DO NOT TAKE 7 DAYS PRIOR TO TEST- Trulicity (dulaglutide) Ozempic, Wegovy (semaglutide) Mounjaro (tirzepatide) Bydureon Bcise (exanatide extended release)  DO NOT TAKE 1 DAY PRIOR TO YOUR TEST Rybelsus (semaglutide) Adlyxin (lixisenatide) Victoza (liraglutide) Byetta (exanatide) ________________________________________________________   Due to recent changes in healthcare laws, you may see the results of your imaging and laboratory studies on MyChart before your provider has had a chance to review them.  We understand that in some cases there may be results that are confusing or concerning to you. Not all laboratory results come back in the same time frame and the provider may be waiting for multiple results in order to interpret others.  Please give us  48 hours in order for your provider to thoroughly review all the results before contacting the office for clarification of your results.   It was a pleasure to see you today!

## 2023-08-14 NOTE — Progress Notes (Signed)
 Attending Physician's Attestation   I have reviewed the chart.   I agree with the Advanced Practitioner's note, impression, and recommendations with any updates as below.    Corliss Parish, MD [facility] Advanced Endoscopy Office # [phone]

## 2023-08-16 ENCOUNTER — Other Ambulatory Visit: Payer: Self-pay | Admitting: Family Medicine

## 2023-08-16 DIAGNOSIS — I10 Essential (primary) hypertension: Secondary | ICD-10-CM

## 2023-08-18 ENCOUNTER — Other Ambulatory Visit: Payer: Self-pay

## 2023-08-18 MED ORDER — LOSARTAN POTASSIUM 25 MG PO TABS
25.0000 mg | ORAL_TABLET | Freq: Every day | ORAL | 0 refills | Status: DC
  Filled 2023-08-18 – 2023-08-20 (×2): qty 30, 30d supply, fill #0
  Filled 2023-09-24: qty 30, 30d supply, fill #1
  Filled 2023-10-23 (×2): qty 30, 30d supply, fill #2

## 2023-08-19 ENCOUNTER — Other Ambulatory Visit: Payer: Self-pay

## 2023-08-20 ENCOUNTER — Other Ambulatory Visit: Payer: Self-pay

## 2023-08-21 ENCOUNTER — Ambulatory Visit: Payer: Self-pay | Admitting: Gastroenterology

## 2023-08-21 ENCOUNTER — Ambulatory Visit (HOSPITAL_COMMUNITY)
Admission: RE | Admit: 2023-08-21 | Discharge: 2023-08-21 | Disposition: A | Discharge status: Home / Self Care | Source: Ambulatory Visit | Attending: Gastroenterology | Admitting: Gastroenterology

## 2023-08-21 DIAGNOSIS — K760 Fatty (change of) liver, not elsewhere classified: Secondary | ICD-10-CM | POA: Insufficient documentation

## 2023-08-25 ENCOUNTER — Encounter: Payer: Self-pay | Admitting: Physician Assistant

## 2023-08-25 ENCOUNTER — Other Ambulatory Visit: Payer: Self-pay | Admitting: Physician Assistant

## 2023-08-25 ENCOUNTER — Ambulatory Visit: Admitting: Physician Assistant

## 2023-08-25 VITALS — BP 144/96 | HR 70 | Ht 69.0 in | Wt 269.0 lb

## 2023-08-25 DIAGNOSIS — I10 Essential (primary) hypertension: Secondary | ICD-10-CM

## 2023-08-25 DIAGNOSIS — E038 Other specified hypothyroidism: Secondary | ICD-10-CM

## 2023-08-25 DIAGNOSIS — E0590 Thyrotoxicosis, unspecified without thyrotoxic crisis or storm: Secondary | ICD-10-CM | POA: Diagnosis not present

## 2023-08-25 DIAGNOSIS — Z0000 Encounter for general adult medical examination without abnormal findings: Secondary | ICD-10-CM | POA: Diagnosis not present

## 2023-08-25 DIAGNOSIS — E782 Mixed hyperlipidemia: Secondary | ICD-10-CM

## 2023-08-25 NOTE — Patient Instructions (Signed)
 VISIT SUMMARY:  You came in today for your annual physical exam. We discussed your hypertension and cholesterol management, and reviewed your general health maintenance.  YOUR PLAN:  -HYPERTENSION: Hypertension means high blood pressure. Your blood pressure readings at home and in the office are not optimally controlled. You should monitor your blood pressure daily for two weeks at the same time each day, while relaxed and before consuming caffeine. If your blood pressure remains elevated, please return to the clinic for a possible medication adjustment.  -HYPERLIPIDEMIA: Hyperlipidemia means you have high levels of cholesterol in your blood. Your LDL cholesterol is elevated, and we aim to keep it closer to 70 mg/dL for better cardiovascular health. You will undergo a biometric screening, including a cholesterol check, to monitor your levels. Please continue taking your cholesterol medication as prescribed.  -GENERAL HEALTH MAINTENANCE: Your annual exam was normal. Your thyroid  function is normal, and you are compliant with your thyroid  and cholesterol medications. Please continue your current thyroid  medication regimen and stay compliant with your cholesterol medication.  How to Take Your Blood Pressure Blood pressure is a measurement of how strongly your blood is pressing against the walls of your arteries. Arteries are blood vessels that carry blood from your heart throughout your body. Your health care provider takes your blood pressure at each office visit. You can also take your own blood pressure at home with a blood pressure monitor. You may need to take your own blood pressure to: Confirm a diagnosis of high blood pressure (hypertension). Monitor your blood pressure over time. Make sure your blood pressure medicine is working. Supplies needed: Blood pressure monitor. A chair to sit in. This should be a chair where you can sit upright with your back supported. Do not sit on a soft couch  or an armchair. Table or desk. Small notebook and pencil or pen. How to prepare To get the most accurate reading, avoid the following for 30 minutes before you check your blood pressure: Drinking caffeine. Drinking alcohol. Eating. Smoking. Exercising. Five minutes before you check your blood pressure: Use the bathroom and urinate so that you have an empty bladder. Sit quietly in a chair. Do not talk. How to take your blood pressure To check your blood pressure, follow the instructions in the manual that came with your blood pressure monitor. If you have a digital blood pressure monitor, the instructions may be as follows: Sit up straight in a chair. Place your feet on the floor. Do not cross your ankles or legs. Rest your left arm at the level of your heart on a table or desk or on the arm of a chair. Pull up your shirt sleeve. Wrap the blood pressure cuff around the upper part of your left arm, 1 inch (2.5 cm) above your elbow. It is best to wrap the cuff around bare skin. Fit the cuff snugly, but not too tightly, around your arm. You should be able to place only one finger between the cuff and your arm. Position the cord so that it rests in the bend of your elbow. Press the power button. Sit quietly while the cuff inflates and deflates. Read the digital reading on the monitor screen and write the numbers down (record them) in a notebook. Wait 2-3 minutes, then repeat the steps, starting at step 1. What does my blood pressure reading mean? A blood pressure reading consists of a higher number over a lower number. Ideally, your blood pressure should be below 120/80. The first ("  top") number is called the systolic pressure. It is a measure of the pressure in your arteries as your heart beats. The second ("bottom") number is called the diastolic pressure. It is a measure of the pressure in your arteries as the heart relaxes. Blood pressure is classified into four stages. The following are  the stages for adults who do not have a short-term serious illness or a chronic condition. Systolic pressure and diastolic pressure are measured in a unit called mm Hg (millimeters of mercury).  Normal Systolic pressure: below 120. Diastolic pressure: below 80. Elevated Systolic pressure: 120-129. Diastolic pressure: below 80. Hypertension stage 1 Systolic pressure: 130-139. Diastolic pressure: 80-89. Hypertension stage 2 Systolic pressure: 140 or above. Diastolic pressure: 90 or above. You can have elevated blood pressure or hypertension even if only the systolic or only the diastolic number in your reading is higher than normal. Follow these instructions at home: Medicines Take over-the-counter and prescription medicines only as told by your health care provider. Tell your health care provider if you are having any side effects from blood pressure medicine. General instructions Check your blood pressure as often as recommended by your health care provider. Check your blood pressure at the same time every day. Take your monitor to the next appointment with your health care provider to make sure that: You are using it correctly. It provides accurate readings. Understand what your goal blood pressure numbers are. Keep all follow-up visits. This is important. General tips Your health care provider can suggest a reliable monitor that will meet your needs. There are several types of home blood pressure monitors. Choose a monitor that has an arm cuff. Do not choose a monitor that measures your blood pressure from your wrist or finger. Choose a cuff that wraps snugly, not too tight or too loose, around your upper arm. You should be able to fit only one finger between your arm and the cuff. You can buy a blood pressure monitor at most drugstores or online. Where to find more information American Heart Association: www.heart.org Contact a health care provider if: Your blood pressure is  consistently high. Your blood pressure is suddenly low. Get help right away if: Your systolic blood pressure is higher than 180. Your diastolic blood pressure is higher than 120. These symptoms may be an emergency. Get help right away. Call 911. Do not wait to see if the symptoms will go away. Do not drive yourself to the hospital. Summary Blood pressure is a measurement of how strongly your blood is pressing against the walls of your arteries. A blood pressure reading consists of a higher number over a lower number. Ideally, your blood pressure should be below 120/80. Check your blood pressure at the same time every day. Avoid caffeine, alcohol, smoking, and exercise for 30 minutes prior to checking your blood pressure. These agents can affect the accuracy of the blood pressure reading. This information is not intended to replace advice given to you by your health care provider. Make sure you discuss any questions you have with your health care provider. Document Revised: 11/22/2020 Document Reviewed: 11/22/2020 Elsevier Patient Education  2024 [employer].

## 2023-08-25 NOTE — Progress Notes (Signed)
 Established Patient Office Visit  Subjective   Patient ID: Larry Thomas, male    DOB: [birth year]  Age: 48 y.o. MRN: [id number]  Chief Complaint  Patient presents with   Annual Exam   Discussed the use of AI scribe software for clinical note transcription with the patient, who gave verbal consent to proceed.  History of Present Illness   Larry Thomas is a 48 year old male who presents for an annual physical exam.  He manages hypertension with Cozaar  (losartan ) 25 mg daily and monitors his blood pressure every other day, with readings ranging from 145/90 to 135/80. He restarted cholesterol medication after elevated LDL levels were noted in a recent screening. A biometric screening is scheduled through his work, and he is fasting today. Thyroid  function was normal in early May. He reports good sleep, appetite, and regular bowel movements.      Past Medical History:  Diagnosis Date   Hypercholesterolemia    Hypertension    Hypothyroidism    Mixed hyperlipidemia 05/18/2017   Thyroid  disease    Social History   Socioeconomic History   Marital status: Married    Spouse name: Not on file   Number of children: Not on file   Years of education: Not on file   Highest education level: Associate degree: occupational, Scientist, product/process development, or vocational program  Occupational History   Not on file  Tobacco Use   Smoking status: Former   Smokeless tobacco: Never  Vaping Use   Vaping status: Never Used  Substance and Sexual Activity   Alcohol use: No   Drug use: No   Sexual activity: Yes  Other Topics Concern   Not on file  Social History Narrative   Patient is married.    Social Drivers of Corporate investment banker Strain: Low Risk  (03/16/2023)   Overall Financial Resource Strain (CARDIA)    Difficulty of Paying Living Expenses: Not hard at all  Food Insecurity: No Food Insecurity (03/16/2023)   Hunger Vital Sign    Worried About Running Out of Food in the Last Year:  Never true    Ran Out of Food in the Last Year: Never true  Transportation Needs: No Transportation Needs (03/16/2023)   PRAPARE - Administrator, Civil Service (Medical): No    Lack of Transportation (Non-Medical): No  Physical Activity: Insufficiently Active (03/16/2023)   Exercise Vital Sign    Days of Exercise per Week: 4 days    Minutes of Exercise per Session: 30 min  Stress: No Stress Concern Present (03/16/2023)   [employer] of Occupational Health - Occupational Stress Questionnaire    Feeling of Stress : Not at all  Social Connections: Moderately Integrated (03/16/2023)   Social Connection and Isolation Panel [NHANES]    Frequency of Communication with Friends and Family: More than three times a week    Frequency of Social Gatherings with Friends and Family: More than three times a week    Attends Religious Services: Never    Database administrator or Organizations: Yes    Attends Engineer, structural: More than 4 times per year    Marital Status: Married  Catering manager Violence: Not on file   Family History  Problem Relation Age of Onset   Thyroid  cancer Mother    No Known Allergies  Review of Systems  Constitutional: Negative.   HENT: Negative.    Eyes: Negative.   Respiratory:  Negative for shortness of breath.  Cardiovascular:  Negative for chest pain.  Gastrointestinal: Negative.   Genitourinary: Negative.   Musculoskeletal: Negative.   Skin: Negative.   Neurological: Negative.   Endo/Heme/Allergies: Negative.   Psychiatric/Behavioral: Negative.        Objective:     BP (!) 144/96 (BP Location: Left Arm, Patient Position: Sitting, Cuff Size: Large)   Pulse 70   Ht 5\' 9"  (1.753 m)   Wt 269 lb (122 kg)   SpO2 97%   BMI 39.72 kg/m  BP Readings from Last 3 Encounters:  08/25/23 (!) 144/96  08/13/23 122/60  07/24/23 134/88   Wt Readings from Last 3 Encounters:  08/25/23 269 lb (122 kg)  08/13/23 270 lb (122.5 kg)   07/24/23 267 lb 12.8 oz (121.5 kg)    Physical Exam Vitals and nursing note reviewed.  Constitutional:      Appearance: Normal appearance.  HENT:     Head: Normocephalic and atraumatic.     Right Ear: External ear normal.     Left Ear: External ear normal.     Nose: Nose normal.     Mouth/Throat:     Mouth: Mucous membranes are moist.     Pharynx: Oropharynx is clear.  Eyes:     Extraocular Movements: Extraocular movements intact.     Conjunctiva/sclera: Conjunctivae normal.     Pupils: Pupils are equal, round, and reactive to light.  Cardiovascular:     Rate and Rhythm: Normal rate and regular rhythm.     Pulses: Normal pulses.     Heart sounds: Normal heart sounds.  Pulmonary:     Effort: Pulmonary effort is normal.     Breath sounds: Normal breath sounds.  Musculoskeletal:        General: Normal range of motion.     Cervical back: Normal range of motion and neck supple.  Skin:    General: Skin is warm and dry.  Neurological:     General: No focal deficit present.     Mental Status: He is alert and oriented to person, place, and time.  Psychiatric:        Mood and Affect: Mood normal.        Behavior: Behavior normal.        Thought Content: Thought content normal.        Judgment: Judgment normal.        Assessment & Plan:   Problem List Items Addressed This Visit       Cardiovascular and Mediastinum   Essential hypertension     Endocrine   Hyperthyroidism     Other   Mixed hyperlipidemia   Other Visit Diagnoses       Wellness examination    -  Primary     Elevated blood pressure reading in office with diagnosis of hypertension         Other specified hypothyroidism           Assessment and Plan Hypertension Blood pressure not optimally controlled. Home readings 135/80 to 145/90 mmHg, office 144/96 mmHg. Not taking amlodipine . Emphasized maintaining BP <=130/80 mmHg to reduce cardiovascular risk. - Monitor BP daily for two weeks, consistent  time, relaxed, before caffeine. - Provide BP monitoring instructions. - Advise return to clinic if BP remains elevated for medication adjustment.  Hyperlipidemia Elevated LDL cholesterol. Current medication may need adjustment. Emphasized LDL target of 70 mg/dL for cardiovascular health. - Undergo biometric screening including cholesterol check. - Emphasize maintaining LDL closer to 70 mg/dL.   I have  reviewed the patient's medical history (PMH, PSH, Social History, Family History, Medications, and allergies) , and have been updated if relevant. I spent 30 minutes reviewing chart and  face to face time with patient.   Return if symptoms worsen or fail to improve.    Etter Hermann Mayers, PA-C

## 2023-08-26 LAB — PSA: Prostate Specific Ag, Serum: 0.6 ng/mL (ref 0.0–4.0)

## 2023-08-27 ENCOUNTER — Ambulatory Visit: Payer: Self-pay | Admitting: Physician Assistant

## 2023-09-02 ENCOUNTER — Encounter: Payer: Self-pay | Admitting: Gastroenterology

## 2023-09-09 ENCOUNTER — Ambulatory Visit: Admitting: Gastroenterology

## 2023-09-09 ENCOUNTER — Encounter: Payer: Self-pay | Admitting: Gastroenterology

## 2023-09-09 VITALS — BP 128/86 | HR 65 | Temp 98.0°F | Resp 14 | Ht 69.0 in | Wt 270.0 lb

## 2023-09-09 DIAGNOSIS — Z1211 Encounter for screening for malignant neoplasm of colon: Secondary | ICD-10-CM | POA: Diagnosis present

## 2023-09-09 DIAGNOSIS — K644 Residual hemorrhoidal skin tags: Secondary | ICD-10-CM | POA: Diagnosis not present

## 2023-09-09 DIAGNOSIS — K641 Second degree hemorrhoids: Secondary | ICD-10-CM

## 2023-09-09 DIAGNOSIS — K760 Fatty (change of) liver, not elsewhere classified: Secondary | ICD-10-CM

## 2023-09-09 MED ORDER — SODIUM CHLORIDE 0.9 % IV SOLN: 500.0000 mL | INTRAVENOUS | Status: DC

## 2023-09-09 NOTE — Progress Notes (Signed)
 Vss nad trans to pacu

## 2023-09-09 NOTE — Op Note (Signed)
 [facility] Patient Name: Larry Thomas Procedure Date: 09/09/2023 9:19 AM MRN: [id number] Endoscopist: Yong Henle , MD, [id number] Age: 48 Referring MD:  Date of Birth: 1975/07/20 Gender: Male Account #: [id number] Procedure:                Colonoscopy Indications:              Screening for colorectal malignant neoplasm, This                            is the patient's first colonoscopy Medicines:                Monitored Anesthesia Care Procedure:                Pre-Anesthesia Assessment:                           - Prior to the procedure, a History and Physical                            was performed, and patient medications and                            allergies were reviewed. The patient's tolerance of                            previous anesthesia was also reviewed. The risks                            and benefits of the procedure and the sedation                            options and risks were discussed with the patient.                            All questions were answered, and informed consent                            was obtained. Prior Anticoagulants: The patient has                            taken no anticoagulant or antiplatelet agents                            except for aspirin . ASA Grade Assessment: II - A                            patient with mild systemic disease. After reviewing                            the risks and benefits, the patient was deemed in                            satisfactory condition to undergo the procedure.  After obtaining informed consent, the colonoscope                            was passed under direct vision. Throughout the                            procedure, the patient's blood pressure, pulse, and                            oxygen saturations were monitored continuously. The                            Olympus Scope SN: [id number] was introduced through                             the anus and advanced to the the cecum, identified                            by appendiceal orifice and ileocecal valve. The                            colonoscopy was performed without difficulty. The                            patient tolerated the procedure. The quality of the                            bowel preparation was adequate. The ileocecal                            valve, appendiceal orifice, and rectum were                            photographed. Scope In: 9:45:46 AM Scope Out: 9:56:09 AM Scope Withdrawal Time: 0 hours 7 minutes 30 seconds  Total Procedure Duration: 0 hours 10 minutes 23 seconds  Findings:                 Skin tags were found on perianal exam.                           The digital rectal exam findings include                            hemorrhoids. Pertinent negatives include no                            palpable rectal lesions.                           Normal mucosa was found in the entire colon.                           Non-bleeding non-thrombosed internal hemorrhoids  were found during retroflexion, during perianal                            exam and during digital exam. The hemorrhoids were                            Grade II (internal hemorrhoids that prolapse but                            reduce spontaneously). Complications:            No immediate complications. Estimated Blood Loss:     Estimated blood loss: none. Impression:               - Perianal skin tags found on perianal exam.                           - Hemorrhoids found on digital rectal exam.                           - Normal mucosa in the entire examined colon.                           - Non-bleeding non-thrombosed internal hemorrhoids. Recommendation:           - The patient will be observed post-procedure,                            until all discharge criteria are met.                           - Discharge patient to home.                           -  Patient has a contact number available for                            emergencies. The signs and symptoms of potential                            delayed complications were discussed with the                            patient. Return to normal activities tomorrow.                            Written discharge instructions were provided to the                            patient.                           - High fiber diet.                           - Use FiberCon 1-2 tablets PO daily.                           -  Continue present medications.                           - Recommend Preparation H use as needed                           - If rectal bleeding becomes significant again,                            then Anusol suppositories can be used. We can                            consider hemorrhoidal banding with one of my                            partners in the future if necessary. I asked that                            the patient update us  via MyChart or via telephone                            if this becomes an issue where we would then send                            the suppositories and then work on potentially                            setting up for banding.                           - Repeat colonoscopy in 10 years for screening                            purposes.                           - The findings and recommendations were discussed                            with the patient.                           - The findings and recommendations were discussed                            with the patient's family. Yong Henle, MD 09/09/2023 10:01:47 AM

## 2023-09-09 NOTE — Patient Instructions (Signed)

## 2023-09-09 NOTE — Progress Notes (Unsigned)
 GASTROENTEROLOGY PROCEDURE H&P NOTE   Primary Care Physician: Collins Dean, NP  HPI: Larry Thomas is a 48 y.o. male who presents for Colon cancer screening.  Past Medical History:  Diagnosis Date   Hypercholesterolemia    Hypertension    Hypothyroidism    Mixed hyperlipidemia 05/18/2017   Thyroid  disease    Past Surgical History:  Procedure Laterality Date   BILATERAL CARPAL TUNNEL RELEASE Bilateral 04/12/2015   Procedure: BILATERAL CARPAL TUNNEL RELEASE;  Surgeon: Brunilda Capra, MD;  Location: [facility];  Service: Orthopedics;  Laterality: Bilateral;   Current Outpatient Medications  Medication Sig Dispense Refill   levothyroxine  (SYNTHROID ) 200 MCG tablet Take 1 tablet (200 mcg total) by mouth daily before breakfast. 90 tablet 0   losartan  (COZAAR ) 25 MG tablet Take 1 tablet (25 mg total) by mouth daily. 90 tablet 0   mirabegron  ER (MYRBETRIQ ) 25 MG TB24 tablet Take 1 tablet (25 mg total) by mouth daily. 30 tablet 11   rosuvastatin  (CRESTOR ) 10 MG tablet Take 1 tablet (10 mg total) by mouth daily. 90 tablet 0   amLODipine  (NORVASC ) 10 MG tablet Take 1 tablet (10 mg total) by mouth daily. (Patient not taking: Reported on 09/09/2023) 90 tablet 1   aspirin  81 MG chewable tablet Chew 81 mg by mouth daily as needed for mild pain (pain score 1-3). (Patient not taking: Reported on 09/09/2023)     Blood Pressure Monitor DEVI Please provide patient with insurance approved blood pressure monitor ICD 10 I10.0 1 each 0   clotrimazole -betamethasone  (LOTRISONE ) cream Apply 1 Application topically daily. 30 g 1   Multiple Vitamins-Minerals (MULTIVITAMIN WITH MINERALS) tablet Take 1 tablet by mouth daily. (Patient not taking: Reported on 09/09/2023)     Current Facility-Administered Medications  Medication Dose Route Frequency Provider Last Rate Last Admin   0.9 %  sodium chloride  infusion  500 mL Intravenous Continuous Mansouraty, Romy Ipock Jr., MD        Current  Outpatient Medications:    levothyroxine  (SYNTHROID ) 200 MCG tablet, Take 1 tablet (200 mcg total) by mouth daily before breakfast., Disp: 90 tablet, Rfl: 0   losartan  (COZAAR ) 25 MG tablet, Take 1 tablet (25 mg total) by mouth daily., Disp: 90 tablet, Rfl: 0   mirabegron  ER (MYRBETRIQ ) 25 MG TB24 tablet, Take 1 tablet (25 mg total) by mouth daily., Disp: 30 tablet, Rfl: 11   rosuvastatin  (CRESTOR ) 10 MG tablet, Take 1 tablet (10 mg total) by mouth daily., Disp: 90 tablet, Rfl: 0   amLODipine  (NORVASC ) 10 MG tablet, Take 1 tablet (10 mg total) by mouth daily. (Patient not taking: Reported on 09/09/2023), Disp: 90 tablet, Rfl: 1   aspirin  81 MG chewable tablet, Chew 81 mg by mouth daily as needed for mild pain (pain score 1-3). (Patient not taking: Reported on 09/09/2023), Disp: , Rfl:    Blood Pressure Monitor DEVI, Please provide patient with insurance approved blood pressure monitor ICD 10 I10.0, Disp: 1 each, Rfl: 0   clotrimazole -betamethasone  (LOTRISONE ) cream, Apply 1 Application topically daily., Disp: 30 g, Rfl: 1   Multiple Vitamins-Minerals (MULTIVITAMIN WITH MINERALS) tablet, Take 1 tablet by mouth daily. (Patient not taking: Reported on 09/09/2023), Disp: , Rfl:   Current Facility-Administered Medications:    0.9 %  sodium chloride  infusion, 500 mL, Intravenous, Continuous, Mansouraty, Albino Alu., MD No Known Allergies Family History  Problem Relation Age of Onset   Thyroid  cancer Mother    Social History   Socioeconomic History  Marital status: Married    Spouse name: Not on file   Number of children: Not on file   Years of education: Not on file   Highest education level: Associate degree: occupational, Scientist, product/process development, or vocational program  Occupational History   Not on file  Tobacco Use   Smoking status: Former   Smokeless tobacco: Never  Vaping Use   Vaping status: Never Used  Substance and Sexual Activity   Alcohol use: Not Currently   Drug use: No   Sexual activity:  Yes  Other Topics Concern   Not on file  Social History Narrative   Patient is married.    Social Drivers of Corporate investment banker Strain: Low Risk  (03/16/2023)   Overall Financial Resource Strain (CARDIA)    Difficulty of Paying Living Expenses: Not hard at all  Food Insecurity: No Food Insecurity (03/16/2023)   Hunger Vital Sign    Worried About Running Out of Food in the Last Year: Never true    Ran Out of Food in the Last Year: Never true  Transportation Needs: No Transportation Needs (03/16/2023)   PRAPARE - Administrator, Civil Service (Medical): No    Lack of Transportation (Non-Medical): No  Physical Activity: Insufficiently Active (03/16/2023)   Exercise Vital Sign    Days of Exercise per Week: 4 days    Minutes of Exercise per Session: 30 min  Stress: No Stress Concern Present (03/16/2023)   [employer] of Occupational Health - Occupational Stress Questionnaire    Feeling of Stress : Not at all  Social Connections: Moderately Integrated (03/16/2023)   Social Connection and Isolation Panel    Frequency of Communication with Friends and Family: More than three times a week    Frequency of Social Gatherings with Friends and Family: More than three times a week    Attends Religious Services: Never    Database administrator or Organizations: Yes    Attends Engineer, structural: More than 4 times per year    Marital Status: Married  Catering manager Violence: Not on file    Physical Exam: Today's Vitals   09/09/23 0846  BP: (!) 154/83  Pulse: (!) 58  Temp: 98 F (36.7 C)  TempSrc: Skin  SpO2: 99%  Weight: 270 lb (122.5 kg)  Height: 5' 9 (1.753 m)   Body mass index is 39.87 kg/m. GEN: NAD EYE: Sclerae anicteric ENT: MMM CV: Non-tachycardic GI: Soft, NT/ND NEURO:  Alert & Oriented x 3  Lab Results: No results for input(s): WBC, HGB, HCT, PLT in the last 72 hours. BMET No results for input(s): NA, K, CL,  CO2, GLUCOSE, BUN, CREATININE, CALCIUM  in the last 72 hours. LFT No results for input(s): PROT, ALBUMIN, AST, ALT, ALKPHOS, BILITOT, BILIDIR, IBILI in the last 72 hours. PT/INR No results for input(s): LABPROT, INR in the last 72 hours.   Impression / Plan: This is a 48 y.o.male who presents for Colon cancer screening.  The risks and benefits of endoscopic evaluation/treatment were discussed with the patient and/or family; these include but are not limited to the risk of perforation, infection, bleeding, missed lesions, lack of diagnosis, severe illness requiring hospitalization, as well as anesthesia and sedation related illnesses.  The patient's history has been reviewed, patient examined, no change in status, and deemed stable for procedure.  The patient and/or family is agreeable to proceed.    Yong Henle, MD [facility] Advanced [facility]oscopy Office # [id number]

## 2023-09-10 ENCOUNTER — Telehealth: Payer: Self-pay | Admitting: *Deleted

## 2023-09-10 NOTE — Telephone Encounter (Signed)
 Attempted post procedure follow up call.  No answer - LVM.

## 2023-09-24 ENCOUNTER — Other Ambulatory Visit: Payer: Self-pay

## 2023-09-24 ENCOUNTER — Other Ambulatory Visit: Payer: Self-pay | Admitting: Family Medicine

## 2023-09-24 ENCOUNTER — Other Ambulatory Visit: Payer: Self-pay | Admitting: Nurse Practitioner

## 2023-09-24 DIAGNOSIS — I10 Essential (primary) hypertension: Secondary | ICD-10-CM

## 2023-09-24 DIAGNOSIS — E785 Hyperlipidemia, unspecified: Secondary | ICD-10-CM

## 2023-09-24 DIAGNOSIS — E039 Hypothyroidism, unspecified: Secondary | ICD-10-CM

## 2023-09-24 MED ORDER — ROSUVASTATIN CALCIUM 10 MG PO TABS
10.0000 mg | ORAL_TABLET | Freq: Every day | ORAL | 0 refills | Status: DC
  Filled 2023-09-24: qty 30, 30d supply, fill #0
  Filled 2023-10-23 (×2): qty 30, 30d supply, fill #1
  Filled 2023-11-26: qty 30, 30d supply, fill #2

## 2023-09-24 MED ORDER — LEVOTHYROXINE SODIUM 200 MCG PO TABS
200.0000 ug | ORAL_TABLET | Freq: Every day | ORAL | 0 refills | Status: DC
  Filled 2023-09-24: qty 30, 30d supply, fill #0
  Filled 2023-10-23 (×2): qty 30, 30d supply, fill #1
  Filled 2023-11-26: qty 30, 30d supply, fill #2

## 2023-09-24 MED ORDER — AMLODIPINE BESYLATE 10 MG PO TABS
10.0000 mg | ORAL_TABLET | Freq: Every day | ORAL | 1 refills | Status: DC
  Filled 2023-09-24: qty 30, 30d supply, fill #0
  Filled 2023-10-23 (×2): qty 30, 30d supply, fill #1
  Filled 2023-11-26: qty 30, 30d supply, fill #2
  Filled 2023-12-25: qty 30, 30d supply, fill #3
  Filled 2024-01-21: qty 30, 30d supply, fill #4
  Filled 2024-02-18: qty 30, 30d supply, fill #5

## 2023-10-23 ENCOUNTER — Other Ambulatory Visit: Payer: Self-pay

## 2023-10-26 ENCOUNTER — Other Ambulatory Visit: Payer: Self-pay

## 2023-11-18 ENCOUNTER — Other Ambulatory Visit: Payer: Self-pay

## 2023-11-25 ENCOUNTER — Telehealth: Payer: Self-pay | Admitting: Nurse Practitioner

## 2023-11-25 NOTE — Telephone Encounter (Signed)
 Pt confirmed appt ( per vr) 9/3

## 2023-11-26 ENCOUNTER — Other Ambulatory Visit: Payer: Self-pay

## 2023-11-26 ENCOUNTER — Other Ambulatory Visit: Payer: Self-pay | Admitting: Family Medicine

## 2023-11-26 DIAGNOSIS — I10 Essential (primary) hypertension: Secondary | ICD-10-CM

## 2023-11-26 MED ORDER — LOSARTAN POTASSIUM 25 MG PO TABS
25.0000 mg | ORAL_TABLET | Freq: Every day | ORAL | 1 refills | Status: AC
  Filled 2023-11-26: qty 30, 30d supply, fill #0
  Filled 2024-01-12: qty 30, 30d supply, fill #1
  Filled 2024-02-04: qty 30, 30d supply, fill #2
  Filled 2024-03-09: qty 30, 30d supply, fill #3
  Filled 2024-04-08: qty 30, 30d supply, fill #4

## 2023-11-27 ENCOUNTER — Ambulatory Visit: Attending: Nurse Practitioner | Admitting: Nurse Practitioner

## 2023-11-27 ENCOUNTER — Encounter: Payer: Self-pay | Admitting: Nurse Practitioner

## 2023-11-27 ENCOUNTER — Other Ambulatory Visit: Payer: Self-pay

## 2023-11-27 VITALS — BP 131/85 | HR 60 | Resp 19 | Ht 69.0 in | Wt 265.6 lb

## 2023-11-27 DIAGNOSIS — E039 Hypothyroidism, unspecified: Secondary | ICD-10-CM

## 2023-11-27 DIAGNOSIS — E785 Hyperlipidemia, unspecified: Secondary | ICD-10-CM | POA: Diagnosis not present

## 2023-11-27 DIAGNOSIS — I10 Essential (primary) hypertension: Secondary | ICD-10-CM

## 2023-11-27 MED ORDER — ROSUVASTATIN CALCIUM 10 MG PO TABS
10.0000 mg | ORAL_TABLET | Freq: Every day | ORAL | 0 refills | Status: DC
  Filled 2023-11-27: qty 90, 90d supply, fill #0
  Filled 2023-12-25: qty 30, 30d supply, fill #0
  Filled 2024-01-21: qty 30, 30d supply, fill #1
  Filled 2024-02-18: qty 30, 30d supply, fill #2

## 2023-11-27 MED ORDER — LEVOTHYROXINE SODIUM 200 MCG PO TABS
200.0000 ug | ORAL_TABLET | Freq: Every day | ORAL | 1 refills | Status: AC
  Filled 2023-11-27: qty 90, 90d supply, fill #0
  Filled 2023-12-25: qty 30, 30d supply, fill #0
  Filled 2024-01-21: qty 30, 30d supply, fill #1
  Filled 2024-02-18: qty 30, 30d supply, fill #2
  Filled 2024-03-09 – 2024-03-14 (×3): qty 30, 30d supply, fill #3
  Filled 2024-04-08: qty 30, 30d supply, fill #4

## 2023-11-27 NOTE — Progress Notes (Signed)
 Assessment & Plan:  [name] was seen today for hypertension.  Diagnoses and all orders for this visit:  Primary hypertension Continue amlodipine  and losartan  as prescribed.  Reminded to bring in blood pressure log for follow  up appointment.  RECOMMENDATIONS: DASH/Mediterranean Diets are healthier choices for HTN.    Acquired hypothyroidism -     levothyroxine  (SYNTHROID ) 200 MCG tablet; Take 1 tablet (200 mcg total) by mouth daily before breakfast.  Dyslipidemia, goal LDL below 100 -     rosuvastatin  (CRESTOR ) 10 MG tablet; Take 1 tablet (10 mg total) by mouth daily. INSTRUCTIONS: Work on a low fat, heart healthy diet and participate in regular aerobic exercise program by working out at least 150 minutes per week; 5 days a week-30 minutes per day. Avoid red meat/beef/steak,  fried foods. junk foods, sodas, sugary drinks, unhealthy snacking, alcohol and smoking.  Drink at least 80 oz of water per day and monitor your carbohydrate intake daily.      Patient has been counseled on age-appropriate routine health concerns for screening and prevention. These are reviewed and up-to-date. Referrals have been placed accordingly. Immunizations are up-to-date or declined.    Subjective:   Chief Complaint  Patient presents with   Hypertension    Larry Thomas 48 y.o. male presents to office today for follow up to HTN  He has a past medical history of Hypercholesterolemia, Hypertension, Hypothyroidism, Mixed hyperlipidemia (05/18/2017), and Thyroid  disease.    Blood pressure is well controlle with losartan  25 mg daily and amlodipine  10 mg daily.  BP Readings from Last 3 Encounters:  11/27/23 131/85  09/09/23 128/86  08/25/23 (!) 144/96      Hypothyroidism Thyroid  level at goal.  He is taking levothyroxine  as prescribed and is not experiencing any symptoms of hypo or hyperthyroidism  Lab Results  Component Value Date   TSH 0.687 07/24/2023     ROS  Past Medical History:   Diagnosis Date   Hypercholesterolemia    Hypertension    Hypothyroidism    Mixed hyperlipidemia 05/18/2017   Thyroid  disease     Past Surgical History:  Procedure Laterality Date   BILATERAL CARPAL TUNNEL RELEASE Bilateral 04/12/2015   Procedure: BILATERAL CARPAL TUNNEL RELEASE;  Surgeon: Franky Curia, MD;  Location: [facility];  Service: Orthopedics;  Laterality: Bilateral;    Family History  Problem Relation Age of Onset   Thyroid  cancer Mother     Social History Reviewed with no changes to be made today.   Outpatient Medications Prior to Visit  Medication Sig Dispense Refill   amLODipine  (NORVASC ) 10 MG tablet Take 1 tablet (10 mg total) by mouth daily. 90 tablet 1   Blood Pressure Monitor DEVI Please provide patient with insurance approved blood pressure monitor ICD 10 I10.0 1 each 0   clotrimazole -betamethasone  (LOTRISONE ) cream Apply 1 Application topically daily. 30 g 1   losartan  (COZAAR ) 25 MG tablet Take 1 tablet (25 mg total) by mouth daily. 90 tablet 1   mirabegron  ER (MYRBETRIQ ) 25 MG TB24 tablet Take 1 tablet (25 mg total) by mouth daily. 30 tablet 11   levothyroxine  (SYNTHROID ) 200 MCG tablet Take 1 tablet (200 mcg total) by mouth daily before breakfast. 90 tablet 0   rosuvastatin  (CRESTOR ) 10 MG tablet Take 1 tablet (10 mg total) by mouth daily. 90 tablet 0   aspirin  81 MG chewable tablet Chew 81 mg by mouth daily as needed for mild pain (pain score 1-3). (Patient not taking: Reported on  09/09/2023)     Multiple Vitamins-Minerals (MULTIVITAMIN WITH MINERALS) tablet Take 1 tablet by mouth daily. (Patient not taking: Reported on 09/09/2023)     No facility-administered medications prior to visit.    No Known Allergies     Objective:    BP 131/85 (BP Location: Left Arm, Patient Position: Sitting, Cuff Size: Large)   Pulse 60   Resp 19   Ht 5' 9 (1.753 m)   Wt 265 lb 9.6 oz (120.5 kg)   SpO2 100%   BMI 39.22 kg/m  Wt Readings from Last 3  Encounters:  11/27/23 265 lb 9.6 oz (120.5 kg)  09/09/23 270 lb (122.5 kg)  08/25/23 269 lb (122 kg)    Physical Exam       Patient has been counseled extensively about nutrition and exercise as well as the importance of adherence with medications and regular follow-up. The patient was given clear instructions to go to ER or return to medical center if symptoms don't improve, worsen or new problems develop. The patient verbalized understanding.   Follow-up: Return in about 3 months (around 02/26/2024).   Haze LELON Servant, FNP-BC [facility] and [facility] [place], [place] [phone]   11/27/2023, 9:45 AM

## 2023-12-25 ENCOUNTER — Other Ambulatory Visit: Payer: Self-pay

## 2024-01-12 ENCOUNTER — Other Ambulatory Visit: Payer: Self-pay

## 2024-01-19 ENCOUNTER — Other Ambulatory Visit (HOSPITAL_COMMUNITY): Payer: Self-pay

## 2024-01-19 MED ORDER — MIRABEGRON ER 25 MG PO TB24
25.0000 mg | ORAL_TABLET | Freq: Every day | ORAL | 3 refills | Status: DC
  Filled 2024-01-19 – 2024-01-21 (×2): qty 30, 30d supply, fill #0
  Filled 2024-02-24: qty 30, 30d supply, fill #1

## 2024-01-21 ENCOUNTER — Other Ambulatory Visit: Payer: Self-pay

## 2024-01-22 ENCOUNTER — Other Ambulatory Visit: Payer: Self-pay

## 2024-01-22 ENCOUNTER — Other Ambulatory Visit (HOSPITAL_COMMUNITY): Payer: Self-pay

## 2024-02-15 ENCOUNTER — Other Ambulatory Visit: Payer: Self-pay

## 2024-02-19 ENCOUNTER — Other Ambulatory Visit: Payer: Self-pay

## 2024-02-22 ENCOUNTER — Other Ambulatory Visit: Payer: Self-pay

## 2024-02-24 ENCOUNTER — Other Ambulatory Visit: Payer: Self-pay

## 2024-02-24 ENCOUNTER — Telehealth: Payer: Self-pay | Admitting: Nurse Practitioner

## 2024-02-24 NOTE — Telephone Encounter (Signed)
 Pt confirmed appt 12/3 (per vr )

## 2024-02-25 ENCOUNTER — Other Ambulatory Visit: Payer: Self-pay

## 2024-02-26 ENCOUNTER — Other Ambulatory Visit: Payer: Self-pay

## 2024-02-26 ENCOUNTER — Encounter: Payer: Self-pay | Admitting: Nurse Practitioner

## 2024-02-26 ENCOUNTER — Ambulatory Visit: Attending: Nurse Practitioner | Admitting: Nurse Practitioner

## 2024-02-26 VITALS — BP 120/78 | HR 62 | Resp 19 | Ht 69.0 in | Wt 271.6 lb

## 2024-02-26 DIAGNOSIS — E039 Hypothyroidism, unspecified: Secondary | ICD-10-CM | POA: Diagnosis not present

## 2024-02-26 DIAGNOSIS — E785 Hyperlipidemia, unspecified: Secondary | ICD-10-CM | POA: Diagnosis not present

## 2024-02-26 DIAGNOSIS — I10 Essential (primary) hypertension: Secondary | ICD-10-CM

## 2024-02-26 MED ORDER — ROSUVASTATIN CALCIUM 10 MG PO TABS
10.0000 mg | ORAL_TABLET | Freq: Every day | ORAL | 1 refills | Status: AC
  Filled 2024-02-26 – 2024-03-10 (×3): qty 90, 90d supply, fill #0
  Filled 2024-04-08: qty 30, 30d supply, fill #1
  Filled ????-??-??: fill #0

## 2024-02-26 MED ORDER — AMLODIPINE BESYLATE 10 MG PO TABS
10.0000 mg | ORAL_TABLET | Freq: Every day | ORAL | 1 refills | Status: AC
  Filled 2024-02-26: qty 90, 90d supply, fill #0
  Filled 2024-03-09: qty 30, 30d supply, fill #0
  Filled 2024-03-10 – 2024-03-14 (×2): qty 90, 90d supply, fill #0
  Filled 2024-03-14 (×2): qty 30, 30d supply, fill #0
  Filled 2024-04-08: qty 30, 30d supply, fill #1

## 2024-02-26 NOTE — Progress Notes (Signed)
 Assessment & Plan:  Larry Thomas was seen today for hypertension.  Diagnoses and all orders for this visit:  Primary hypertension -     amLODipine  (NORVASC ) 10 MG tablet; Take 1 tablet (10 mg total) by mouth daily. -     CMP14+EGFR Hypertension with amlodipine . - Continue amlodipine  as prescribed.   Acquired hypothyroidism -     Thyroid  Panel With TSH Requires monitoring of thyroid  levels. - Ordered thyroid  level test.  Dyslipidemia, goal LDL below 100 -     rosuvastatin  (CRESTOR ) 10 MG tablet; Take 1 tablet (10 mg total) by mouth daily.    Patient has been counseled on age-appropriate routine health concerns for screening and prevention. These are reviewed and up-to-date. Referrals have been placed accordingly. Immunizations are up-to-date or declined.    Subjective:   Chief Complaint  Patient presents with   Hypertension    Larry Thomas 48 y.o. male presents to office today for follow up to HTN.   Larry Thomas has a past medical history of Hypercholesterolemia, Hypertension, Hypothyroidism, Mixed hyperlipidemia (05/18/2017), and Thyroid  disease.      HTN Blood pressure is well controlled. Larry Thomas is taking losartan  25 mg daily and amlodipine  10 mg daily.  BP Readings from Last 3 Encounters:  02/26/24 120/78  11/27/23 131/85  09/09/23 128/86     Hypothyroidism Larry Thomas is taking levothyroxine  200 mg daily as prescribed.  Lab Results  Component Value Date   TSH 0.687 07/24/2023   T4TOTAL 10.4 07/24/2023      Review of Systems  Constitutional:  Negative for fever, malaise/fatigue and weight loss.  HENT: Negative.  Negative for nosebleeds.   Eyes: Negative.  Negative for blurred vision, double vision and photophobia.  Respiratory: Negative.  Negative for cough and shortness of breath.   Cardiovascular: Negative.  Negative for chest pain, palpitations and leg swelling.  Gastrointestinal: Negative.  Negative for heartburn, nausea and vomiting.  Musculoskeletal: Negative.   Negative for myalgias.  Neurological: Negative.  Negative for dizziness, focal weakness, seizures and headaches.  Psychiatric/Behavioral: Negative.  Negative for suicidal ideas.     Past Medical History:  Diagnosis Date   Hypercholesterolemia    Hypertension    Hypothyroidism    Mixed hyperlipidemia 05/18/2017   Thyroid  disease     Past Surgical History:  Procedure Laterality Date   BILATERAL CARPAL TUNNEL RELEASE Bilateral 04/12/2015   Procedure: BILATERAL CARPAL TUNNEL RELEASE;  Surgeon: Franky Curia, MD;  [facility]:  [facility];  Service: Orthopedics;  Laterality: Bilateral;    Family History  Problem Relation Age of Onset   Thyroid  cancer Mother     Social History Reviewed with no changes to be made today.   Outpatient Medications Prior to Visit  Medication Sig Dispense Refill   Blood Pressure Monitor DEVI Please provide patient with insurance approved blood pressure monitor ICD 10 I10.0 1 each 0   clotrimazole -betamethasone  (LOTRISONE ) cream Apply 1 Application topically daily. 30 g 1   levothyroxine  (SYNTHROID ) 200 MCG tablet Take 1 tablet (200 mcg total) by mouth daily before breakfast. 90 tablet 1   losartan  (COZAAR ) 25 MG tablet Take 1 tablet (25 mg total) by mouth daily. 90 tablet 1   amLODipine  (NORVASC ) 10 MG tablet Take 1 tablet (10 mg total) by mouth daily. 90 tablet 1   rosuvastatin  (CRESTOR ) 10 MG tablet Take 1 tablet (10 mg total) by mouth daily. 90 tablet 0   mirabegron  ER (MYRBETRIQ ) 25 MG TB24 tablet Take 1 tablet (25 mg total) by mouth  daily. 30 tablet 11   mirabegron  ER (MYRBETRIQ ) 25 MG TB24 tablet Take 1 tablet (25 mg total) by mouth daily. 90 tablet 3   No facility-administered medications prior to visit.    No Known Allergies     Objective:    BP 120/78 (BP Location: Left Arm, Patient Position: Sitting, Cuff Size: Normal)   Pulse 62   Resp 19   Ht 5' 9 (1.753 m)   Wt 271 lb 9.6 oz (123.2 kg)   SpO2 100%   BMI 40.11 kg/m  Wt  Readings from Last 3 Encounters:  02/26/24 271 lb 9.6 oz (123.2 kg)  11/27/23 265 lb 9.6 oz (120.5 kg)  09/09/23 270 lb (122.5 kg)    Physical Exam Vitals and nursing note reviewed.  Constitutional:      Appearance: Larry Thomas is well-developed.  HENT:     Head: Normocephalic and atraumatic.  Cardiovascular:     Rate and Rhythm: Normal rate and regular rhythm.     Heart sounds: Normal heart sounds. No murmur heard.    No friction rub. No gallop.  Pulmonary:     Effort: Pulmonary effort is normal. No tachypnea or respiratory distress.     Breath sounds: Normal breath sounds. No decreased breath sounds, wheezing, rhonchi or rales.  Chest:     Chest wall: No tenderness.  Abdominal:     General: Bowel sounds are normal.     Palpations: Abdomen is soft.  Musculoskeletal:        General: Normal range of motion.     Cervical back: Normal range of motion.  Skin:    General: Skin is warm and dry.  Neurological:     Mental Status: Larry Thomas is alert and oriented to person, place, and time.     Coordination: Coordination normal.  Psychiatric:        Behavior: Behavior normal. Behavior is cooperative.        Thought Content: Thought content normal.        Judgment: Judgment normal.          Patient has been counseled extensively about nutrition and exercise as well as the importance of adherence with medications and regular follow-up. The patient was given clear instructions to go to ER or return to medical center if symptoms don't improve, worsen or new problems develop. The patient verbalized understanding.   Follow-up: Return in about 6 months (around 08/26/2024).   Haze LELON Servant, FNP-BC [facility] [place], [place] [phone]   02/26/2024, 10:31 AM

## 2024-02-27 LAB — THYROID PANEL WITH TSH
Free Thyroxine Index: 3.3 (ref 1.2–4.9)
T3 Uptake Ratio: 30 % (ref 24–39)
T4, Total: 10.9 ug/dL (ref 4.5–12.0)
TSH: 0.482 u[IU]/mL (ref 0.450–4.500)

## 2024-02-27 LAB — CMP14+EGFR
ALT: 23 IU/L (ref 0–44)
AST: 19 IU/L (ref 0–40)
Albumin: 4.1 g/dL (ref 4.1–5.1)
Alkaline Phosphatase: 64 IU/L (ref 47–123)
BUN/Creatinine Ratio: 12 (ref 9–20)
BUN: 11 mg/dL (ref 6–24)
Bilirubin Total: 0.5 mg/dL (ref 0.0–1.2)
CO2: 25 mmol/L (ref 20–29)
Calcium: 9.4 mg/dL (ref 8.7–10.2)
Chloride: 105 mmol/L (ref 96–106)
Creatinine, Ser: 0.92 mg/dL (ref 0.76–1.27)
Globulin, Total: 2.8 g/dL (ref 1.5–4.5)
Glucose: 92 mg/dL (ref 70–99)
Potassium: 4.6 mmol/L (ref 3.5–5.2)
Sodium: 142 mmol/L (ref 134–144)
Total Protein: 6.9 g/dL (ref 6.0–8.5)
eGFR: 102 mL/min/1.73 (ref >59)

## 2024-02-28 ENCOUNTER — Ambulatory Visit: Payer: Self-pay | Admitting: Nurse Practitioner

## 2024-03-09 ENCOUNTER — Other Ambulatory Visit: Payer: Self-pay

## 2024-03-10 ENCOUNTER — Other Ambulatory Visit: Payer: Self-pay

## 2024-03-14 ENCOUNTER — Other Ambulatory Visit: Payer: Self-pay

## 2024-09-09 ENCOUNTER — Ambulatory Visit: Admitting: Nurse Practitioner
